# Patient Record
Sex: Female | Born: 1935
Health system: Southern US, Community
[De-identification: ages and names within clinical notes are randomized; demographics above are authoritative.]

## PROBLEM LIST (undated history)

## (undated) DIAGNOSIS — E785 Hyperlipidemia, unspecified: Secondary | ICD-10-CM

## (undated) DIAGNOSIS — Z8542 Personal history of malignant neoplasm of other parts of uterus: Secondary | ICD-10-CM

## (undated) DIAGNOSIS — I10 Essential (primary) hypertension: Secondary | ICD-10-CM

## (undated) DIAGNOSIS — C55 Malignant neoplasm of uterus, part unspecified: Secondary | ICD-10-CM

## (undated) DIAGNOSIS — K5730 Diverticulosis of large intestine without perforation or abscess without bleeding: Secondary | ICD-10-CM

## (undated) DIAGNOSIS — E119 Type 2 diabetes mellitus without complications: Secondary | ICD-10-CM

## (undated) DIAGNOSIS — N200 Calculus of kidney: Secondary | ICD-10-CM

## (undated) DIAGNOSIS — H409 Unspecified glaucoma: Secondary | ICD-10-CM

## (undated) DIAGNOSIS — K219 Gastro-esophageal reflux disease without esophagitis: Secondary | ICD-10-CM

## (undated) DIAGNOSIS — C259 Malignant neoplasm of pancreas, unspecified: Secondary | ICD-10-CM

## (undated) DIAGNOSIS — H269 Unspecified cataract: Secondary | ICD-10-CM

## (undated) DIAGNOSIS — Z800 Family history of malignant neoplasm of digestive organs: Secondary | ICD-10-CM

## (undated) DIAGNOSIS — Z8049 Family history of malignant neoplasm of other genital organs: Secondary | ICD-10-CM

## (undated) DIAGNOSIS — E079 Disorder of thyroid, unspecified: Secondary | ICD-10-CM

## (undated) HISTORY — DX: Personal history of malignant neoplasm of other parts of uterus: Z85.42

## (undated) HISTORY — PX: COLONOSCOPY: SHX174

## (undated) HISTORY — PX: BREAST EXCISIONAL BIOPSY: SUR124

## (undated) HISTORY — DX: Type 2 diabetes mellitus without complications: E11.9

## (undated) HISTORY — DX: Disorder of thyroid, unspecified: E07.9

## (undated) HISTORY — DX: Gastro-esophageal reflux disease without esophagitis: K21.9

## (undated) HISTORY — PX: UPPER GASTROINTESTINAL ENDOSCOPY: SHX188

## (undated) HISTORY — DX: Hyperlipidemia, unspecified: E78.5

## (undated) HISTORY — PX: OTHER SURGICAL HISTORY: SHX169

## (undated) HISTORY — DX: Diverticulosis of large intestine without perforation or abscess without bleeding: K57.30

## (undated) HISTORY — DX: Essential (primary) hypertension: I10

## (undated) HISTORY — DX: Unspecified cataract: H26.9

## (undated) HISTORY — PX: CHOLECYSTECTOMY: SHX55

## (undated) HISTORY — DX: Malignant neoplasm of uterus, part unspecified: C55

## (undated) HISTORY — DX: Family history of malignant neoplasm of digestive organs: Z80.0

## (undated) HISTORY — DX: Family history of malignant neoplasm of other genital organs: Z80.49

## (undated) HISTORY — DX: Unspecified glaucoma: H40.9

---

## 1984-05-17 ENCOUNTER — Encounter: Payer: Self-pay | Admitting: Gastroenterology

## 1994-01-02 HISTORY — PX: BREAST BIOPSY: SHX20

## 1996-01-03 DIAGNOSIS — C55 Malignant neoplasm of uterus, part unspecified: Secondary | ICD-10-CM

## 1996-01-03 HISTORY — DX: Malignant neoplasm of uterus, part unspecified: C55

## 1996-12-22 HISTORY — PX: ABDOMINAL HYSTERECTOMY: SHX81

## 1997-06-10 ENCOUNTER — Ambulatory Visit (HOSPITAL_BASED_OUTPATIENT_CLINIC_OR_DEPARTMENT_OTHER): Admission: RE | Admit: 1997-06-10 | Discharge: 1997-06-10 | Payer: Self-pay | Admitting: Plastic Surgery

## 1997-06-30 ENCOUNTER — Other Ambulatory Visit: Admission: RE | Admit: 1997-06-30 | Discharge: 1997-06-30 | Payer: Self-pay | Admitting: Gynecology

## 1997-10-14 ENCOUNTER — Other Ambulatory Visit: Admission: RE | Admit: 1997-10-14 | Discharge: 1997-10-14 | Payer: Self-pay | Admitting: Gynecology

## 1998-03-09 ENCOUNTER — Other Ambulatory Visit: Admission: RE | Admit: 1998-03-09 | Discharge: 1998-03-09 | Payer: Self-pay | Admitting: Gynecology

## 1998-10-06 ENCOUNTER — Other Ambulatory Visit: Admission: RE | Admit: 1998-10-06 | Discharge: 1998-10-06 | Payer: Self-pay | Admitting: Gynecology

## 1998-12-29 ENCOUNTER — Encounter: Payer: Self-pay | Admitting: Family Medicine

## 1998-12-29 LAB — CONVERTED CEMR LAB: TSH: 0.79 microintl units/mL

## 1999-01-17 ENCOUNTER — Encounter: Payer: Self-pay | Admitting: Family Medicine

## 1999-01-17 ENCOUNTER — Encounter: Admission: RE | Admit: 1999-01-17 | Discharge: 1999-01-17 | Payer: Self-pay | Admitting: Family Medicine

## 1999-04-20 ENCOUNTER — Other Ambulatory Visit: Admission: RE | Admit: 1999-04-20 | Discharge: 1999-04-20 | Payer: Self-pay | Admitting: Gynecology

## 1999-11-17 ENCOUNTER — Other Ambulatory Visit: Admission: RE | Admit: 1999-11-17 | Discharge: 1999-11-17 | Payer: Self-pay | Admitting: Gynecology

## 2000-02-07 ENCOUNTER — Encounter: Payer: Self-pay | Admitting: Gynecology

## 2000-02-07 ENCOUNTER — Encounter: Admission: RE | Admit: 2000-02-07 | Discharge: 2000-02-07 | Payer: Self-pay | Admitting: Gynecology

## 2000-04-19 ENCOUNTER — Other Ambulatory Visit: Admission: RE | Admit: 2000-04-19 | Discharge: 2000-04-19 | Payer: Self-pay | Admitting: Gynecology

## 2000-04-29 ENCOUNTER — Encounter: Payer: Self-pay | Admitting: Emergency Medicine

## 2000-04-29 ENCOUNTER — Emergency Department (HOSPITAL_COMMUNITY): Admission: EM | Admit: 2000-04-29 | Discharge: 2000-04-29 | Payer: Self-pay | Admitting: Emergency Medicine

## 2000-04-29 ENCOUNTER — Encounter: Payer: Self-pay | Admitting: Family Medicine

## 2000-04-30 ENCOUNTER — Encounter: Payer: Self-pay | Admitting: Emergency Medicine

## 2000-04-30 ENCOUNTER — Inpatient Hospital Stay (HOSPITAL_COMMUNITY): Admission: EM | Admit: 2000-04-30 | Discharge: 2000-05-01 | Payer: Self-pay | Admitting: Emergency Medicine

## 2000-05-01 ENCOUNTER — Encounter: Payer: Self-pay | Admitting: Family Medicine

## 2000-05-01 LAB — CONVERTED CEMR LAB: WBC, blood: 10.7 10*3/uL

## 2000-09-27 ENCOUNTER — Encounter: Payer: Self-pay | Admitting: Family Medicine

## 2000-09-27 LAB — CONVERTED CEMR LAB
Blood Glucose, Fasting: 98 mg/dL
TSH: 0.57 microintl units/mL

## 2000-10-08 ENCOUNTER — Encounter: Payer: Self-pay | Admitting: Family Medicine

## 2000-10-08 ENCOUNTER — Encounter: Admission: RE | Admit: 2000-10-08 | Discharge: 2000-10-08 | Payer: Self-pay | Admitting: Family Medicine

## 2000-12-31 ENCOUNTER — Encounter: Payer: Self-pay | Admitting: Family Medicine

## 2001-04-29 ENCOUNTER — Other Ambulatory Visit: Admission: RE | Admit: 2001-04-29 | Discharge: 2001-04-29 | Payer: Self-pay | Admitting: Gynecology

## 2001-05-09 ENCOUNTER — Encounter: Payer: Self-pay | Admitting: Family Medicine

## 2001-05-09 ENCOUNTER — Encounter: Admission: RE | Admit: 2001-05-09 | Discharge: 2001-05-09 | Payer: Self-pay | Admitting: Family Medicine

## 2001-09-17 ENCOUNTER — Encounter: Payer: Self-pay | Admitting: Emergency Medicine

## 2001-09-17 ENCOUNTER — Emergency Department (HOSPITAL_COMMUNITY): Admission: EM | Admit: 2001-09-17 | Discharge: 2001-09-17 | Payer: Self-pay | Admitting: Emergency Medicine

## 2001-10-02 ENCOUNTER — Encounter: Payer: Self-pay | Admitting: Gastroenterology

## 2001-10-10 ENCOUNTER — Encounter: Payer: Self-pay | Admitting: Gastroenterology

## 2001-10-10 ENCOUNTER — Ambulatory Visit (HOSPITAL_COMMUNITY): Admission: RE | Admit: 2001-10-10 | Discharge: 2001-10-10 | Payer: Self-pay | Admitting: Gastroenterology

## 2001-11-04 ENCOUNTER — Encounter: Payer: Self-pay | Admitting: Gastroenterology

## 2001-11-04 LAB — HM COLONOSCOPY

## 2002-01-10 ENCOUNTER — Encounter: Payer: Self-pay | Admitting: Family Medicine

## 2002-01-10 LAB — CONVERTED CEMR LAB
Blood Glucose, Fasting: 102 mg/dL
RBC count: 4.62 10*6/uL
WBC, blood: 6.3 10*3/uL

## 2002-06-19 ENCOUNTER — Encounter: Admission: RE | Admit: 2002-06-19 | Discharge: 2002-06-19 | Payer: Self-pay | Admitting: Family Medicine

## 2002-06-19 ENCOUNTER — Encounter: Payer: Self-pay | Admitting: Family Medicine

## 2002-06-26 ENCOUNTER — Ambulatory Visit (HOSPITAL_BASED_OUTPATIENT_CLINIC_OR_DEPARTMENT_OTHER): Admission: RE | Admit: 2002-06-26 | Discharge: 2002-06-26 | Payer: Self-pay | Admitting: Plastic Surgery

## 2002-06-26 ENCOUNTER — Encounter (INDEPENDENT_AMBULATORY_CARE_PROVIDER_SITE_OTHER): Payer: Self-pay | Admitting: *Deleted

## 2002-07-10 ENCOUNTER — Other Ambulatory Visit: Admission: RE | Admit: 2002-07-10 | Discharge: 2002-07-10 | Payer: Self-pay | Admitting: Gynecology

## 2003-01-15 ENCOUNTER — Encounter: Payer: Self-pay | Admitting: Family Medicine

## 2003-01-15 LAB — CONVERTED CEMR LAB: TSH: 0.42 microintl units/mL

## 2003-08-17 ENCOUNTER — Other Ambulatory Visit: Admission: RE | Admit: 2003-08-17 | Discharge: 2003-08-17 | Payer: Self-pay | Admitting: Gynecology

## 2003-12-04 ENCOUNTER — Ambulatory Visit: Payer: Self-pay | Admitting: Family Medicine

## 2004-03-10 ENCOUNTER — Ambulatory Visit: Payer: Self-pay | Admitting: Family Medicine

## 2004-03-11 ENCOUNTER — Encounter: Payer: Self-pay | Admitting: Family Medicine

## 2004-03-11 LAB — CONVERTED CEMR LAB
Blood Glucose, Fasting: 113 mg/dL
RBC count: 4.73 10*6/uL
TSH: 1.13 microintl units/mL
WBC, blood: 7 10*3/uL

## 2004-03-14 ENCOUNTER — Ambulatory Visit: Payer: Self-pay | Admitting: Family Medicine

## 2004-03-31 ENCOUNTER — Encounter: Admission: RE | Admit: 2004-03-31 | Discharge: 2004-03-31 | Payer: Self-pay | Admitting: Family Medicine

## 2004-03-31 ENCOUNTER — Ambulatory Visit: Payer: Self-pay | Admitting: Family Medicine

## 2004-07-04 ENCOUNTER — Ambulatory Visit: Payer: Self-pay | Admitting: Family Medicine

## 2004-07-04 LAB — CONVERTED CEMR LAB
RBC count: 4.89 10*6/uL
WBC, blood: 3.1 10*3/uL

## 2004-07-20 ENCOUNTER — Encounter: Payer: Self-pay | Admitting: Family Medicine

## 2004-07-22 ENCOUNTER — Ambulatory Visit (HOSPITAL_COMMUNITY): Admission: RE | Admit: 2004-07-22 | Discharge: 2004-07-22 | Payer: Self-pay | Admitting: Ophthalmology

## 2004-08-30 ENCOUNTER — Other Ambulatory Visit: Admission: RE | Admit: 2004-08-30 | Discharge: 2004-08-30 | Payer: Self-pay | Admitting: Gynecology

## 2004-09-14 ENCOUNTER — Ambulatory Visit: Payer: Self-pay | Admitting: Family Medicine

## 2004-09-16 ENCOUNTER — Ambulatory Visit: Payer: Self-pay | Admitting: Family Medicine

## 2004-09-20 ENCOUNTER — Encounter: Admission: RE | Admit: 2004-09-20 | Discharge: 2004-09-20 | Payer: Self-pay | Admitting: Family Medicine

## 2005-03-09 ENCOUNTER — Ambulatory Visit: Payer: Self-pay | Admitting: Family Medicine

## 2005-03-09 LAB — CONVERTED CEMR LAB
Blood Glucose, Fasting: 83 mg/dL
TSH: 1.13 microintl units/mL
WBC, blood: 6.1 10*3/uL

## 2005-03-14 ENCOUNTER — Ambulatory Visit: Payer: Self-pay | Admitting: Family Medicine

## 2005-03-14 LAB — CONVERTED CEMR LAB: WBC, blood: 5.2 10*3/uL

## 2005-03-29 ENCOUNTER — Ambulatory Visit: Payer: Self-pay | Admitting: Family Medicine

## 2005-04-04 ENCOUNTER — Encounter: Admission: RE | Admit: 2005-04-04 | Discharge: 2005-04-04 | Payer: Self-pay | Admitting: Family Medicine

## 2005-06-13 ENCOUNTER — Ambulatory Visit: Payer: Self-pay | Admitting: Family Medicine

## 2005-06-13 LAB — CONVERTED CEMR LAB
Blood Glucose, Fasting: 91 mg/dL
TSH: 0.18 microintl units/mL

## 2005-06-15 ENCOUNTER — Ambulatory Visit: Payer: Self-pay | Admitting: Family Medicine

## 2005-10-17 ENCOUNTER — Ambulatory Visit: Payer: Self-pay | Admitting: Family Medicine

## 2005-10-19 ENCOUNTER — Ambulatory Visit: Payer: Self-pay | Admitting: Family Medicine

## 2006-06-21 ENCOUNTER — Ambulatory Visit: Payer: Self-pay | Admitting: Family Medicine

## 2006-11-07 ENCOUNTER — Telehealth: Payer: Self-pay | Admitting: Family Medicine

## 2006-11-19 ENCOUNTER — Encounter: Admission: RE | Admit: 2006-11-19 | Discharge: 2006-11-19 | Payer: Self-pay | Admitting: Family Medicine

## 2006-11-20 ENCOUNTER — Ambulatory Visit: Payer: Self-pay | Admitting: Family Medicine

## 2006-11-28 ENCOUNTER — Encounter (INDEPENDENT_AMBULATORY_CARE_PROVIDER_SITE_OTHER): Payer: Self-pay | Admitting: *Deleted

## 2007-01-30 ENCOUNTER — Ambulatory Visit: Payer: Self-pay | Admitting: Family Medicine

## 2007-05-28 ENCOUNTER — Ambulatory Visit: Payer: Self-pay | Admitting: Family Medicine

## 2007-05-28 ENCOUNTER — Encounter: Payer: Self-pay | Admitting: Family Medicine

## 2007-05-28 DIAGNOSIS — M899 Disorder of bone, unspecified: Secondary | ICD-10-CM

## 2007-05-28 DIAGNOSIS — M949 Disorder of cartilage, unspecified: Secondary | ICD-10-CM

## 2007-05-28 DIAGNOSIS — E039 Hypothyroidism, unspecified: Secondary | ICD-10-CM | POA: Insufficient documentation

## 2007-05-28 DIAGNOSIS — C55 Malignant neoplasm of uterus, part unspecified: Secondary | ICD-10-CM

## 2007-05-28 DIAGNOSIS — R7301 Impaired fasting glucose: Secondary | ICD-10-CM | POA: Insufficient documentation

## 2007-05-28 DIAGNOSIS — E782 Mixed hyperlipidemia: Secondary | ICD-10-CM

## 2007-05-28 DIAGNOSIS — K5730 Diverticulosis of large intestine without perforation or abscess without bleeding: Secondary | ICD-10-CM | POA: Insufficient documentation

## 2007-05-28 DIAGNOSIS — I10 Essential (primary) hypertension: Secondary | ICD-10-CM

## 2007-05-28 LAB — CONVERTED CEMR LAB
ALT: 17 units/L (ref 0–35)
Albumin: 3.6 g/dL (ref 3.5–5.2)
BUN: 13 mg/dL (ref 6–23)
Chloride: 109 meq/L (ref 96–112)
Cholesterol: 248 mg/dL (ref 0–200)
Eosinophils Absolute: 0.1 10*3/uL (ref 0.0–0.7)
GFR calc Af Amer: 90 mL/min
GFR calc non Af Amer: 74 mL/min
Glucose, Bld: 109 mg/dL — ABNORMAL HIGH (ref 70–99)
HDL: 56.1 mg/dL (ref >39.0)
Hemoglobin: 14.3 g/dL (ref 12.0–15.0)
MCHC: 33.8 g/dL (ref 30.0–36.0)
Neutrophils Relative %: 36.1 % — ABNORMAL LOW (ref 43.0–77.0)
Platelets: 249 10*3/uL (ref 150–400)
RBC: 4.55 M/uL (ref 3.87–5.11)
RDW: 12.2 % (ref 11.5–14.6)
Sodium: 143 meq/L (ref 135–145)
TSH: 0.59 microintl units/mL (ref 0.35–5.50)
Total Protein: 6.8 g/dL (ref 6.0–8.3)
Triglycerides: 229 mg/dL (ref 0–149)
VLDL: 46 mg/dL — ABNORMAL HIGH (ref 0–40)

## 2007-06-11 ENCOUNTER — Ambulatory Visit: Payer: Self-pay | Admitting: Family Medicine

## 2007-07-29 ENCOUNTER — Encounter: Payer: Self-pay | Admitting: Family Medicine

## 2007-08-01 ENCOUNTER — Encounter (INDEPENDENT_AMBULATORY_CARE_PROVIDER_SITE_OTHER): Payer: Self-pay | Admitting: *Deleted

## 2007-08-01 ENCOUNTER — Ambulatory Visit: Payer: Self-pay | Admitting: Family Medicine

## 2007-08-01 LAB — CONVERTED CEMR LAB: OCCULT 1: NEGATIVE

## 2007-08-22 ENCOUNTER — Ambulatory Visit: Payer: Self-pay | Admitting: Family Medicine

## 2007-09-10 ENCOUNTER — Encounter: Payer: Self-pay | Admitting: Family Medicine

## 2007-09-19 ENCOUNTER — Ambulatory Visit: Payer: Self-pay | Admitting: Family Medicine

## 2007-09-19 LAB — CONVERTED CEMR LAB
ALT: 19 units/L (ref 0–35)
AST: 21 units/L (ref 0–37)

## 2007-11-12 ENCOUNTER — Ambulatory Visit: Payer: Self-pay | Admitting: Family Medicine

## 2007-11-12 LAB — CONVERTED CEMR LAB
ALT: 19 units/L (ref 0–35)
Cholesterol: 165 mg/dL (ref 0–200)
HDL: 59.4 mg/dL (ref >39.0)
LDL Cholesterol: 84 mg/dL (ref 0–99)
VLDL: 22 mg/dL (ref 0–40)

## 2007-11-19 ENCOUNTER — Ambulatory Visit: Payer: Self-pay | Admitting: Family Medicine

## 2007-11-19 DIAGNOSIS — I739 Peripheral vascular disease, unspecified: Secondary | ICD-10-CM | POA: Insufficient documentation

## 2007-11-25 ENCOUNTER — Telehealth: Payer: Self-pay | Admitting: Family Medicine

## 2008-01-01 ENCOUNTER — Encounter: Payer: Self-pay | Admitting: Family Medicine

## 2008-01-22 ENCOUNTER — Encounter: Admission: RE | Admit: 2008-01-22 | Discharge: 2008-01-22 | Payer: Self-pay | Admitting: Orthopedic Surgery

## 2008-01-23 ENCOUNTER — Ambulatory Visit (HOSPITAL_BASED_OUTPATIENT_CLINIC_OR_DEPARTMENT_OTHER): Admission: RE | Admit: 2008-01-23 | Discharge: 2008-01-23 | Payer: Self-pay | Admitting: Orthopedic Surgery

## 2008-01-23 ENCOUNTER — Encounter (INDEPENDENT_AMBULATORY_CARE_PROVIDER_SITE_OTHER): Payer: Self-pay | Admitting: Orthopedic Surgery

## 2008-01-23 ENCOUNTER — Encounter: Payer: Self-pay | Admitting: Family Medicine

## 2008-01-23 HISTORY — PX: OTHER SURGICAL HISTORY: SHX169

## 2008-02-24 ENCOUNTER — Encounter: Payer: Self-pay | Admitting: Family Medicine

## 2008-04-01 ENCOUNTER — Encounter: Payer: Self-pay | Admitting: Family Medicine

## 2008-06-02 ENCOUNTER — Ambulatory Visit: Payer: Self-pay | Admitting: Family Medicine

## 2008-06-02 DIAGNOSIS — M2550 Pain in unspecified joint: Secondary | ICD-10-CM

## 2008-06-10 ENCOUNTER — Encounter: Payer: Self-pay | Admitting: Family Medicine

## 2008-06-10 ENCOUNTER — Encounter: Admission: RE | Admit: 2008-06-10 | Discharge: 2008-06-10 | Payer: Self-pay | Admitting: Rheumatology

## 2008-06-18 ENCOUNTER — Ambulatory Visit: Payer: Self-pay | Admitting: Family Medicine

## 2008-06-18 LAB — CONVERTED CEMR LAB
Basophils Absolute: 0 10*3/uL (ref 0.0–0.1)
Basophils Relative: 0.5 % (ref 0.0–3.0)
CO2: 31 meq/L (ref 19–32)
Cholesterol: 196 mg/dL (ref 0–200)
Eosinophils Absolute: 0.1 10*3/uL (ref 0.0–0.7)
Free T4: 0.9 ng/dL (ref 0.6–1.6)
GFR calc non Af Amer: 73.99 mL/min (ref >60)
HDL: 74.9 mg/dL (ref >39.00)
Lymphocytes Relative: 51 % — ABNORMAL HIGH (ref 12.0–46.0)
Microalb Creat Ratio: 5.9 mg/g (ref 0.0–30.0)
Microalb, Ur: 0.9 mg/dL (ref 0.0–1.9)
Neutro Abs: 2.7 10*3/uL (ref 1.4–7.7)
Neutrophils Relative %: 39.8 % — ABNORMAL LOW (ref 43.0–77.0)
Platelets: 251 10*3/uL (ref 150.0–400.0)
Sodium: 142 meq/L (ref 135–145)
TSH: 0.55 microintl units/mL (ref 0.35–5.50)
Total Bilirubin: 0.9 mg/dL (ref 0.3–1.2)
Total CHOL/HDL Ratio: 3
Total Protein: 7.2 g/dL (ref 6.0–8.3)
VLDL: 25.4 mg/dL (ref 0.0–40.0)

## 2008-06-20 LAB — CONVERTED CEMR LAB: Vit D, 25-Hydroxy: 21 ng/mL — ABNORMAL LOW (ref 30–89)

## 2008-06-23 ENCOUNTER — Ambulatory Visit: Payer: Self-pay | Admitting: Family Medicine

## 2008-06-23 DIAGNOSIS — R1032 Left lower quadrant pain: Secondary | ICD-10-CM | POA: Insufficient documentation

## 2008-06-23 LAB — CONVERTED CEMR LAB: Bilirubin Urine: NEGATIVE

## 2008-07-21 ENCOUNTER — Encounter: Payer: Self-pay | Admitting: Family Medicine

## 2008-07-30 ENCOUNTER — Ambulatory Visit: Payer: Self-pay | Admitting: Family Medicine

## 2008-07-30 DIAGNOSIS — M76829 Posterior tibial tendinitis, unspecified leg: Secondary | ICD-10-CM

## 2008-09-14 ENCOUNTER — Ambulatory Visit: Payer: Self-pay | Admitting: Family Medicine

## 2008-09-14 DIAGNOSIS — K219 Gastro-esophageal reflux disease without esophagitis: Secondary | ICD-10-CM

## 2008-09-14 DIAGNOSIS — R1013 Epigastric pain: Secondary | ICD-10-CM | POA: Insufficient documentation

## 2008-09-14 LAB — CONVERTED CEMR LAB
ALT: 20 units/L (ref 0–35)
AST: 26 units/L (ref 0–37)
Albumin: 3.7 g/dL (ref 3.5–5.2)
Alkaline Phosphatase: 56 units/L (ref 39–117)
H Pylori IgG: NEGATIVE
Total Protein: 7.3 g/dL (ref 6.0–8.3)

## 2008-09-17 ENCOUNTER — Encounter (INDEPENDENT_AMBULATORY_CARE_PROVIDER_SITE_OTHER): Payer: Self-pay | Admitting: *Deleted

## 2008-10-26 ENCOUNTER — Ambulatory Visit: Payer: Self-pay | Admitting: Family Medicine

## 2008-10-26 DIAGNOSIS — A09 Infectious gastroenteritis and colitis, unspecified: Secondary | ICD-10-CM | POA: Insufficient documentation

## 2009-01-25 ENCOUNTER — Telehealth: Payer: Self-pay | Admitting: Family Medicine

## 2009-01-28 ENCOUNTER — Encounter (INDEPENDENT_AMBULATORY_CARE_PROVIDER_SITE_OTHER): Payer: Self-pay | Admitting: *Deleted

## 2009-01-28 ENCOUNTER — Ambulatory Visit: Payer: Self-pay | Admitting: Family Medicine

## 2009-02-23 ENCOUNTER — Encounter (INDEPENDENT_AMBULATORY_CARE_PROVIDER_SITE_OTHER): Payer: Self-pay | Admitting: *Deleted

## 2009-02-23 ENCOUNTER — Ambulatory Visit: Payer: Self-pay | Admitting: Gastroenterology

## 2009-02-24 LAB — CONVERTED CEMR LAB
ALT: 18 units/L (ref 0–35)
Albumin: 3.8 g/dL (ref 3.5–5.2)
BUN: 12 mg/dL (ref 6–23)
Basophils Relative: 0.5 % (ref 0.0–3.0)
Bilirubin, Direct: 0.1 mg/dL (ref 0.0–0.3)
CO2: 34 meq/L — ABNORMAL HIGH (ref 19–32)
Creatinine, Ser: 0.8 mg/dL (ref 0.4–1.2)
Glucose, Bld: 99 mg/dL (ref 70–99)
Lipase: 25 units/L (ref 11.0–59.0)
Lymphocytes Relative: 47.4 % — ABNORMAL HIGH (ref 12.0–46.0)
Lymphs Abs: 2.8 10*3/uL (ref 0.7–4.0)
MCHC: 33 g/dL (ref 30.0–36.0)
Monocytes Absolute: 0.5 10*3/uL (ref 0.1–1.0)
Neutro Abs: 2.5 10*3/uL (ref 1.4–7.7)
Platelets: 247 10*3/uL (ref 150.0–400.0)
RBC: 4.35 M/uL (ref 3.87–5.11)
RDW: 12.5 % (ref 11.5–14.6)
Sodium: 144 meq/L (ref 135–145)
Total Bilirubin: 0.4 mg/dL (ref 0.3–1.2)
WBC: 5.9 10*3/uL (ref 4.5–10.5)

## 2009-03-04 ENCOUNTER — Ambulatory Visit: Payer: Self-pay | Admitting: Gastroenterology

## 2009-03-05 ENCOUNTER — Encounter: Payer: Self-pay | Admitting: Gastroenterology

## 2009-03-08 ENCOUNTER — Telehealth: Payer: Self-pay | Admitting: Gastroenterology

## 2009-07-28 ENCOUNTER — Encounter: Payer: Self-pay | Admitting: Family Medicine

## 2009-08-10 ENCOUNTER — Encounter (INDEPENDENT_AMBULATORY_CARE_PROVIDER_SITE_OTHER): Payer: Self-pay | Admitting: *Deleted

## 2010-02-01 NOTE — Letter (Signed)
Summary: New Patient letter  [facility]  [address] [place], [place] [address]   Phone: [phone]  Fax: [phone]       01/28/2009 MRN: [id number]  [facility] [address] [place], [place]  [address]  Dear Ms. Ogle,  Welcome to the Gastroenterology Division at [facility].    You are scheduled to see Dr. Russella Dar on 02-23-09 at 1:45p.m. on the 3rd floor at [facility], [address]  We ask that you try to arrive at our office 15 minutes prior to your appointment time to allow for check-in.  We would like you to complete the enclosed self-administered evaluation form prior to your visit and bring it with you on the day of your appointment.  We will review it with you.  Also, please bring a complete list of all your medications or, if you prefer, bring the medication bottles and we will list them.  Please bring your insurance card so that we may make a copy of it.  If your insurance requires a referral to see a specialist, please bring your referral form from your primary care physician.  Co-payments are due at the time of your visit and may be paid by cash, check or credit card.     Your office visit will consist of a consult with your physician (includes a physical exam), any laboratory testing he/she may order, scheduling of any necessary diagnostic testing (e.g. x-ray, ultrasound, CT-scan), and scheduling of a procedure (e.g. Endoscopy, Colonoscopy) if required.  Please allow enough time on your schedule to allow for any/all of these possibilities.    If you cannot keep your appointment, please call [phone] to cancel or reschedule prior to your appointment date.  This allows [place] the opportunity to schedule an appointment for another patient in need of care.  If you do not cancel or reschedule by 5 p.m. the business day prior to your appointment date, you will be charged a $50.00 late cancellation/no-show fee.    Thank you for choosing [facility] for your medical needs.  We appreciate the opportunity to care for you.  Please visit [place] at our website  to learn more about our practice.                     Sincerely,                                                             The Gastroenterology Division

## 2010-02-01 NOTE — Letter (Signed)
Summary: EGD Instructions  [facility]  [address] [place], [place] [address]   Phone: [phone]  Fax: [phone]       Molly [name]    [birth year]    MRN: [id number]       Procedure Day /Date: Thursday March 3rd, 2011     Arrival Time:  7:30am     Procedure Time: 8:30am     Location of Procedure:                    _ x _ [facility] (4th Floor)    PREPARATION FOR ENDOSCOPY   On  03/04/09 THE DAY OF THE PROCEDURE:  1.   No solid foods, milk or milk products are allowed after midnight the night before your procedure.  2.   Do not drink anything colored red or purple.  Avoid juices with pulp.  No orange juice.  3.  You may drink clear liquids until 6:30am  , which is 2 hours before your procedure.                                                                                                CLEAR LIQUIDS INCLUDE: Water Jello Ice Popsicles Tea (sugar ok, no milk/cream) Powdered fruit flavored drinks Coffee (sugar ok, no milk/cream) Gatorade Juice: apple, white grape, white cranberry  Lemonade Clear bullion, consomm, broth Carbonated beverages (any kind) Strained chicken noodle soup Hard Candy   MEDICATION INSTRUCTIONS  Unless otherwise instructed, you should take regular prescription medications with a small sip of water as early as possible the morning of your procedure.           OTHER INSTRUCTIONS  You will need a responsible adult at least 75 years of age to accompany you and drive you home.   This person must remain in the waiting room during your procedure.  Wear loose fitting clothing that is easily removed.  Leave jewelry and other valuables at home.  However, you may wish to bring a book to read or an iPod/MP3 player to listen to music as you wait for your procedure to start.  Remove all body piercing jewelry and leave at home.  Total time from sign-in until discharge is approximately 2-3 hours.  You should go  home directly after your procedure and rest.  You can resume normal activities the day after your procedure.  The day of your procedure you should not:   Drive   Make legal decisions   Operate machinery   Drink alcohol   Return to work  You will receive specific instructions about eating, activities and medications before you leave.    The above instructions have been reviewed and explained to me by   Marchelle Folks.     I fully understand and can verbalize these instructions _____________________________ Date _________

## 2010-02-01 NOTE — Letter (Signed)
Summary: Nadara Eaton letter  [place] [facility]  [address] [place], [place] [address]   Phone: [phone]  Fax: [phone]       08/10/2009 MRN: [id number]  [facility] [address] [place], [place]  [address]  Dear Ms. [facility] [place] and [place] announce the retirement of Arta Silence, M.D., from full-time practice at the [facility] office effective July 01, 2009 and his plans of returning part-time.  It is important to Dr. Hetty Ely and to our practice that you understand that [facility] has seven physicians in our office for your health care needs.  We will continue to offer the same exceptional care that you have today.    Dr. Hetty Ely has spoken to many of you about his plans for retirement and returning part-time in the fall.   We will continue to work with you through the transition to schedule appointments for you in the office and meet the high standards that Dante is committed to.   Again, it is with great pleasure that we share the news that Dr. Hetty Ely will return to [facility] in October of 2011 with a reduced schedule.    If you have any questions, or would like to request an appointment with one of our physicians, please call us at [phone] and press the option for Scheduling an appointment.  We take pleasure in providing you with excellent patient care and look forward to seeing you at your next office visit.  Our [facility] Physicians are:  Tillman Abide, M.D. Laurita Quint, M.D. Roxy Manns, M.D. Kerby Nora, M.D. Hannah Beat, M.D. Ruthe Mannan, M.D. We proudly welcomed Raechel Ache, M.D. and Eustaquio Boyden, M.D. to the practice in July/August 2011.  Sincerely,  [facility]

## 2010-02-01 NOTE — Letter (Signed)
Summary: [name] Biopsy Results  [facility]  [address] [place], [place] [address]   Phone: [phone]  Fax: [phone]        March 05, 2009 MRN: [id number]    [facility] [address] [place], [place]  [address]    Dear Ms. Watkin,  I am pleased to inform you that the biopsies taken during your recent endoscopic examination did not show any evidence of cancer upon pathologic examination. The biopsies were unremarkable.  Continue with the treatment plan as outlined on the day of your      exam.  Please call us if you are having persistent problems or have questions about your condition that have not been fully answered at this time.  Sincerely,  Meryl Dare MD [facility]  This letter has been electronically signed by your physician.  Appended Document: Patient Notice-Endo Biopsy Results Letter mailed 3.8.11

## 2010-02-01 NOTE — Medication Information (Signed)
Summary: Premarin Review/[facility]  Premarin Review/[facility]   Imported By: Maryln Gottron 08/02/2009 14:40:56  _____________________________________________________________________  External Attachment:    Type:   Image     Comment:   External Document

## 2010-02-01 NOTE — Procedures (Signed)
Summary: [facility]  [facility]   Imported By: Sherian Rein 02/24/2009 09:01:34  _____________________________________________________________________  External Attachment:    Type:   Image     Comment:   External Document

## 2010-02-01 NOTE — Miscellaneous (Signed)
Summary: dexilant rx.  Clinical Lists Changes  Medications: Added new medication of DEXILANT 60 MG CPDR (DEXLANSOPRAZOLE) take 1 tab every morning. - Signed Rx of DEXILANT 60 MG CPDR (DEXLANSOPRAZOLE) take 1 tab every morning.;  #30 x 5;  Signed;  Entered by: Darlyn Read RN;  Authorized by: Meryl Dare MD Clementeen Graham;  Method used: Electronically to [facility] [place], [place]  [address], Ph: [phone], Fax: [phone]    Prescriptions: DEXILANT 60 MG CPDR (DEXLANSOPRAZOLE) take 1 tab every morning.  #30 x 5   Entered by:   Darlyn Read RN   Authorized by:   Meryl Dare MD [facility]   Signed by:   Darlyn Read RN on 03/04/2009   Method used:   Electronically to        [address] (retail)       [address]       [place], [place]  [address]       Ph: [phone]       Fax: [phone]   RxID:   [id number]

## 2010-02-01 NOTE — Assessment & Plan Note (Signed)
Summary: Gastroenterology  Molly [name]"  MR#:  [id number] Page #  Molly [name]   GASTROENTEROLOGY CONSULTATION OFFICE NOTE  NAME:  Molly [name], Molly "Peggy"   OFFICE NO:  [id number]  DATE:  10/02/01  DOB:  [birth year]  REFERRING PHYSICIAN:  Dr. Laurita Quint.  REASON FOR REFERRAL:  Abnormal liver function tests and abnormal imaging of the common bile duct.    HISTORY OF PRESENT ILLNESS:  The patient is a very nice 77 year old white female referred through the courtesy of Dr. Hetty Ely.  Her son, Letta Moynahan, is a patient of mine as well.  The patient presented to the emergency room at [facility] on September 17, 2001 with a several-day history of diarrhea, nausea, and abdominal pain.  She felt she was dehydrated.  Evaluation revealed a minimally elevated glucose at 123, an SGOT of 54, and a SGPT of 45.  She had 11-20 leukocytes per high-power field on her urinalysis.  Abdominal ultrasound imaging revealed an 11 mm common bile duct, gallbladder surgically absent.  CT scan of the abdomen revealed mild dilation of the common bile duct, an absent gallbladder, and no other abnormalities.  Her symptoms completely resolved after several days, and she was presumed to have an infectious gastroenteritis.  She previously had an ERCP by Dr. Sheryn Bison in 1986.  Following her cholecystectomy, the ERCP was normal.  She underwent colonoscopy by Dr. Jarold Motto in 1986, which was normal.  Upper endoscopy by Dr. Corinda [name] in 1986 revealed a mild gastritis.  She underwent flexible sigmoidoscopy in 1992, which revealed external hemorrhoids and no internal lesions.  She has not had colorectal cancer screening with sigmoidoscopy or colonoscopy since that time.  She notes no melena, dark urine, jaundice, hematochezia, change in stool caliber, recurrent abdominal pain, nausea, vomiting, or reflux symptoms.  PAST MEDICAL HISTORY:  Hypothyroidism since the patient's 30s, external hemorrhoids,  hypercholesterolemia, hypertension, uterine cancer, status post total abdominal hysterectomy, history of cystitis, status post cholecystectomy, status post left breast biopsy, benign, in 1996.  CURRENT MEDICATIONS:  As listed on the chart, updated and reviewed.    ALLERGIES:  Novocain, Macrodantin, and morphine.    PHYSICAL EXAMINATION:  A well-developed, well-nourished, white female in no acute distress.  Vital signs:  Weight not obtained.  Blood pressure is 110/68, pulse is 64 and regular.  HEENT exam:  Anicteric sclerae.  Oropharynx clear.  Chest:  Clear to auscultation and percussion.  Cardiac:  A regular rate and rhythm without murmurs.  Abdomen is soft, nontender, nondistended, normal, active bowel sounds, no palpable organomegaly, masses, or hernias.  Rectal examination deferred.  Extremities without clubbing, cyanosis, or edema.  Neurologic:  Alert and oriented x 3.    ASSESSMENT AND PLAN:   1.  Mild common bile duct dilation and mildly abnormal liver function tests.  Mild common bile duct dilation is common post cholecystectomy, however, in light of the abnormal liver function tests, obstructing lesions, such as a stone, should be excluded.  Magnetic resonance cholangiopancreatography will be scheduled for further evaluation.  If abnormalities are noted, endoscopic retrograde choledochopancreatography may be indicated.  I plan to repeat the patient's liver function tests today, and if they remain abnormal without evidence of biliary pathology, further hepatic evaluation will be necessary.  It does appear that she had an infectious gastroenteritis leading to her symptoms on her recent emergency room evaluation. 2.  Average risk for colorectal cancer.  The risks, benefits, and alternatives to colonoscopy with possible polypectomy discussed with the patient, and she  consents to proceed.  This will be scheduled electively. 3.  Abnormal urinalysis and ongoing medical care.  Further follow up with Dr.  Hetty Ely.     Venita Lick. Russella Dar, M.D., F.A.C.G.  [id number] cc:  Dr. Laurita Quint D:  10/02/01; T:  ; Job [id number]

## 2010-02-01 NOTE — Procedures (Signed)
Summary: Upper Endoscopy  Patient: Molly [name] Note: All result statuses are Final unless otherwise noted.  Tests: (1) Upper Endoscopy (EGD)   EGD Upper Endoscopy       DONE     [facility]     [address]     [place], [place]  [address]           ENDOSCOPY PROCEDURE REPORT           PATIENT:  Molly [name], Molly [name]  MR#:  [id number]     BIRTHDATE:  1929/12/07, 79 yrs. old  GENDER:  female           ENDOSCOPIST:  Judie Petit T. Russella Dar, MD, [facility]           PROCEDURE DATE:  03/04/2009     PROCEDURE:  EGD with biopsy     ASA CLASS:  Class II     INDICATIONS:  epigastric pain           MEDICATIONS:  Fentanyl 25 mcg IV, Versed 5 mg IV     TOPICAL ANESTHETIC:  Exactacain Spray           DESCRIPTION OF PROCEDURE:   After the risks benefits and     alternatives of the procedure were thoroughly explained, informed     consent was obtained.  The LB GIF-H180 [id number] endoscope was     introduced through the mouth and advanced to the second portion of     the duodenum, without limitations.  The instrument was slowly     withdrawn as the mucosa was fully examined.     <<PROCEDUREIMAGES>>           Esophagitis was found in the distal esophagus. It was erosive. LA     Classifcation Grade A.  Moderate gastritis was found in the body     of the stomach. It was erosive and erythematous. Multiple biopsies     were obtained and sent to pathology.  The duodenal bulb was normal     in appearance, as was the postbulbar duodenum.  Otherwise the     examination was normal.  Retroflexed views revealed no     abnormalities.  The scope was then withdrawn from the patient and     the procedure completed.           COMPLICATIONS:  None           ENDOSCOPIC IMPRESSION:     1) Erosive esophagitis     2) Moderate gastritis           RECOMMENDATIONS:     1) Anti-reflux regimen     2) PPI qam: Dexilant 60mg  po qam, #30, 5 refills     3) Await pathology results     4) OP follow-up in 4 weeks.       Venita Lick. Russella Dar, MD, Clementeen Graham           CC:  Arta Silence, MD           n.     Rosalie DoctorVenita Lick. Hank Walling at 03/04/2009 08:41 AM           [place], [id number]  Note: An exclamation mark (!) indicates a result that was not dispersed into the flowsheet. Document Creation Date: 03/04/2009 8:42 AM _______________________________________________________________________  (1) Order result status: Final Collection or observation date-time: 03/04/2009 08:36 Requested date-time:  Receipt date-time:  Reported date-time:  Referring Physician:   Ordering Physician: Claudette Head [phone]) Specimen  Source:  Source: Launa Grill Order Number: [id number] Lab site:   Appended Document: Upper Endoscopy    Clinical Lists Changes  Observations: Added new observation of PAST SURG HX: DETACHED RETINA REPAIR OS CHOLEYCYSTECTOMY 1985 GYN-- VAGINAL ULTRASOUND:(09/1996) T.A.H. --B.S.O. -- UTERINE CANCER:9DR FONTAINE)(12/22/1996) DEXA SCAN-- --MIN OSTEOPENIA:( 01/1999) HOSPITAL [facility]- PNEUMONIA--HYPOKALEMIA--NAUSEA AND VOMITING:(04/28-30/2002) COLONOSCOPY --DIVERICS, INTERNAL HEMORRHOIDS:(12/01/2001) ABD. CT-- NORMAL:(10/10/2001) DEXA -- IMPROVED// NOT OSTEOPENIA:(09/20/2004) Release of R Long finger A1 Pulley, Excision Myxoid Dorsal mass R Index Finger (Dr Teressa Senter) (01/23/2008) Benign breast biopsy 1996 EGD Mod Gastritis, Erosive Esophagitis  Bx B9  (Dr Russella Dar) 03/04/2009 (03/08/2009 17:06)       Past Surgical History:    DETACHED RETINA REPAIR OS    CHOLEYCYSTECTOMY 1985    GYN-- VAGINAL ULTRASOUND:(09/1996)    T.A.H. --B.S.O. -- UTERINE CANCER:9DR FONTAINE)(12/22/1996)    DEXA SCAN-- --MIN OSTEOPENIA:( 01/1999)    HOSPITAL [facility]- PNEUMONIA--HYPOKALEMIA--NAUSEA AND VOMITING:(04/28-30/2002)    COLONOSCOPY --DIVERICS, INTERNAL HEMORRHOIDS:(12/01/2001)    ABD. CT-- NORMAL:(10/10/2001)    DEXA -- IMPROVED// NOT OSTEOPENIA:(09/20/2004)    Release of R Long finger A1 Pulley, Excision  Myxoid Dorsal mass R Index Finger (Dr Teressa Senter) (01/23/2008)    Benign breast biopsy 1996    EGD Mod Gastritis, Erosive Esophagitis  Bx B9  (Dr Russella Dar) 03/04/2009

## 2010-02-01 NOTE — Assessment & Plan Note (Signed)
Summary: stomach pain/hmw   Vital Signs:  Patient profile:   77 year old female Weight:      154.50 pounds Temp:     98.5 degrees F oral Pulse rate:   68 / minute Pulse rhythm:   regular BP sitting:   128 / 70  (left arm) Cuff size:   regular  Vitals Entered By: Sydell Axon LPN (January 28, 2009 12:30 PM) CC: Stomach pain, upper   History of Present Illness: Pt here for continued abd discomfort in the epigastric area, worse at night. She had problems last Fall and was seen, Put on PPI tx and went on trip to [place] and [place]. Sxs better but still evident while there and have continued since beiong back. She describes them as pain knawing in Xyphoid area, somewhat assoc w/ eating, happens at night, not gone with PPI. Has never seen GI or had EGD.  Problems Prior to Update: 1)  Traveler's Diarrhea  (ICD-009.2) 2)  Gerd  (ICD-530.81) 3)  Abdominal Pain, Epigastric  (ICD-789.06) 4)  Tibialis Tendinitis  (ICD-726.72) 5)  Abdominal Pain, Left Lower Quadrant  (ICD-789.04) 6)  Pain in Joint, Multiple Sites  (ICD-719.49) 7)  Intermittent Claudication, Left Leg  (ICD-443.9) 8)  Special Screening Malig Neoplasms Other Sites  (ICD-V76.49) 9)  Malignant Neoplasm of Uterus Part Unspecified  (ICD-179) 10)  Diverticulosis, Colon  (ICD-562.10) 11)  Trigger Finger (DR SYPHER)  (ICD-727.03) 12)  Osteopenia  (ICD-733.90) 13)  Impaired Fasting Glucose  (ICD-790.21) 14)  Hypertension  (ICD-401.9) 15)  Hyperlipidemia, Mixed  (ICD-272.2) 16)  Hypothyroidism  (ICD-244.9) 17)  Hip Pain, Bilat  (ICD-719.45) 18)  Preventive Health Care  (ICD-V70.0)  Medications Prior to Update: 1)  Premarin 0.3 Mg  Tabs (Estrogens Conjugated) .Marland Kitchen.. 1 Once Daily By Mouth 2)  Tenormin 25 Mg  Tabs (Atenolol) .Marland Kitchen.. 1  Daily By Mouth 3)  Hydrochlorothiazide 25 Mg  Tabs (Hydrochlorothiazide) .... 1/2 Tablet Once Daily  By Mouth 4)  Levoxyl 88 Mcg  Tabs (Levothyroxine Sodium) .Marland Kitchen.. 1 Daily By Mouth 5)  Bayer Low  Strength 81 Mg  Tbec (Aspirin) .Marland Kitchen.. 1 Tablet Daily By Mouth 6)  Calcium 500/d 500-125 Mg-Unit  Tabs (Calcium Carbonate-Vitamin D) .Marland Kitchen.. 1 Tablet Twice A Day By Mouth 7)  Crestor 10 Mg  Tabs (Rosuvastatin Calcium) .... One Tab By Mouth At Bedtime 8)  Lomotil 2.5-0.025 Mg Tabs (Diphenoxylate-Atropine) .... One Tab By Mouth Once Daily As Needed Prolonged Diarrhea  Allergies: 1)  ! * Novacaine 2)  ! Morphine 3)  ! Macrodantin 4)  ! Vytorin 5)  ! * Bandaids  Physical Exam  General:  Well-developed,well-nourished,in no acute distress; alert,appropriate and cooperative throughout examination Head:  Normocephalic and atraumatic without obvious abnormalities. No apparent alopecia or balding. Eyes:  Conjunctiva clear bilaterally.  Ears:  External ear exam shows no significant lesions or deformities.  Otoscopic examination reveals clear canals, tympanic membranes are intact bilaterally without bulging, retraction, inflammation or discharge. Hearing is grossly normal bilaterally. Nose:  External nasal examination shows no deformity or inflammation. Nasal mucosa are pink and moist without lesions or exudates. Mouth:  Oral mucosa and oropharynx without lesions or exudates.  Teeth in good repair. Neck:  No deformities, masses, or tenderness noted. Lungs:  Normal respiratory effort, chest expands symmetrically. Lungs are clear to auscultation, no crackles or wheezes. Heart:  Normal rate and regular rhythm. S1 and S2 normal without gallop, murmur, click, rub or other extra sounds. Abdomen:  mild epigastric discomfort, none now but  points to the xyphoid area. soft, normal bowel sounds, no distention, no masses, no guarding, no rigidity, no rebound tenderness, no abdominal hernia, no inguinal hernia, no hepatomegaly, and no splenomegaly.     Impression & Recommendations:  Problem # 1:  GERD (ICD-530.81) Assessment Unchanged Presumed but cont in the favce of PPI. Discussed prophylaxis, some of which she  does not do but would like to see GI. Will refer. Orders: Gastroenterology Referral (GI)  Complete Medication List: 1)  Premarin 0.3 Mg Tabs (Estrogens conjugated) .Marland Kitchen.. 1 once daily by mouth 2)  Tenormin 25 Mg Tabs (Atenolol) .Marland Kitchen.. 1  daily by mouth 3)  Hydrochlorothiazide 25 Mg Tabs (Hydrochlorothiazide) .... 1/2 tablet once daily  by mouth 4)  Levoxyl 88 Mcg Tabs (Levothyroxine sodium) .Marland Kitchen.. 1 daily by mouth 5)  Bayer Low Strength 81 Mg Tbec (Aspirin) .Marland Kitchen.. 1 tablet daily by mouth 6)  Calcium 500/d 500-125 Mg-unit Tabs (Calcium carbonate-vitamin d) .Marland Kitchen.. 1 tablet twice a day by mouth 7)  Crestor 10 Mg Tabs (Rosuvastatin calcium) .... One tab by mouth at bedtime 8)  Lomotil 2.5-0.025 Mg Tabs (Diphenoxylate-atropine) .... One tab by mouth once daily as needed prolonged diarrhea  Patient Instructions: 1)  Refer to GI  Current Allergies (reviewed today): ! * NOVACAINE ! MORPHINE ! MACRODANTIN ! VYTORIN ! * BANDAIDS

## 2010-02-01 NOTE — Assessment & Plan Note (Signed)
Summary: GERD--ch.   History of Present Illness Visit Type: consult Primary GI MD: Elie Goody MD [facility] Primary Provider: Laurita Quint, MD Requesting Provider: Laurita Quint, MD Chief Complaint: epigastric pain that is worse when laying down.  Pt has started taking bedtime meds after supper, but has not noticed any improvement in pain History of Present Illness:   This is a 77 year old female, who notes epigastric pain, associated with belching, mainly at night, and first thing in the morning. Her symptoms tend to occur when she is recumbent. She took her husbands Nexium 40 mg daily for approximately 6 weeks with no change in symptoms.   GI Review of Systems    Reports abdominal pain and  belching.     Location of  Abdominal pain: epigastric area.    Denies acid reflux, bloating, chest pain, dysphagia with liquids, dysphagia with solids, heartburn, loss of appetite, nausea, vomiting, vomiting blood, weight loss, and  weight gain.      Reports hemorrhoids.     Denies anal fissure, black tarry stools, change in bowel habit, constipation, diarrhea, diverticulosis, fecal incontinence, heme positive stool, irritable bowel syndrome, jaundice, light color stool, liver problems, rectal bleeding, and  rectal pain. Preventive Screening-Counseling & Management  Alcohol-Tobacco     Smoking Status: never      Drug Use:  no.     Current Medications (verified): 1)  Premarin 0.3 Mg  Tabs (Estrogens Conjugated) .Marland Kitchen.. 1 Once Daily By Mouth 2)  Tenormin 25 Mg  Tabs (Atenolol) .Marland Kitchen.. 1  Daily By Mouth 3)  Hydrochlorothiazide 25 Mg  Tabs (Hydrochlorothiazide) .... 1/2 Tablet Once Daily  By Mouth 4)  Levoxyl 88 Mcg  Tabs (Levothyroxine Sodium) .Marland Kitchen.. 1 Daily By Mouth 5)  Bayer Low Strength 81 Mg  Tbec (Aspirin) .Marland Kitchen.. 1 Tablet Daily By Mouth 6)  Calcium 500/d 500-125 Mg-Unit  Tabs (Calcium Carbonate-Vitamin D) .Marland Kitchen.. 1 Tablet Twice A Day By Mouth 7)  Crestor 10 Mg  Tabs (Rosuvastatin Calcium) .... One  Tab By Mouth At Bedtime 8)  Lomotil 2.5-0.025 Mg Tabs (Diphenoxylate-Atropine) .... One Tab By Mouth Once Daily As Needed Prolonged Diarrhea  Allergies (verified): 1)  ! * Novacaine 2)  ! Morphine 3)  ! Macrodantin 4)  ! Vytorin 5)  ! * Bandaids  Past History:  Past Medical History: Reviewed history from 02/22/2009 and no changes required. Hypothyroidism Hemorrhoids Hyperlipidemia Hypertension Uterine Cancer Diverticulosis GERD  Past Surgical History: Reviewed history from 02/22/2009 and no changes required. DETACHED RETINA REPAIR OS CHOLEYCYSTECTOMY 1985 GYN-- VAGINAL ULTRASOUND:(09/1996) [facility] -- UTERINE CANCER:9DR FONTAINE)(12/22/1996) DEXA SCAN-- --MIN OSTEOPENIA:( 01/1999) HOSPITAL [facility]- PNEUMONIA--HYPOKALEMIA--NAUSEA AND VOMITING:(04/28-30/2002) COLONOSCOPY --DIVERICS, INTERNAL HEMORRHOIDS:(12/01/2001) ABD. CT-- NORMAL:(10/10/2001) DEXA -- IMPROVED// NOT OSTEOPENIA:(09/20/2004) Release of R Long finger A1 Pulley, Excision Myxoid Dorsal mass R Index Finger (Dr Teressa Senter) (01/23/2008) Benign breast biopsy 1996  Family History: Reviewed history from 05/28/2007 and no changes required. Father: dec 63   MI Mother: (MARGAERET GOURLEY) dec  44  SISTER dec  FROM CERVICAL CANCER  CV: + FATHER MI HBP: + SELF DM: NEGATIVE GOUT/ARTHRITIS:  PROSTATE CANCER: CERVICAL CANCERa: + SISTER DECEASED FROM IT// + SELF UTERINE CANCER BREAST CANCER: NEGATIVE DEPRESSION:  ETOH/DRUG ABUSE:  OTHER : + STROKE GRANDMOTHER  Social History: Marital Status: Married  LIVES WITH HUSBAND Children:3 CHILDREN Occupation:  RETIRED Cabin crew Patient has never smoked.  Daily Caffeine Use 3/day Illicit Drug Use - no  Review of Systems       The patient complains of urination changes/pain.  The pertinent positives and negatives are noted as above and in the HPI. All other ROS were reviewed and were negative.   Vital Signs:  Patient profile:   77 year old  female Height:      63.50 inches Weight:      155.6 pounds BMI:     27.23 Pulse rate:   64 / minute Pulse rhythm:   regular BP sitting:   130 / 66  (left arm) Cuff size:   regular  Vitals Entered By: Francee Piccolo CMA Duncan Dull) (February 23, 2009 1:57 PM)  Physical Exam  General:  Well developed, well nourished, no acute distress. Head:  Normocephalic and atraumatic. Eyes:  PERRLA, no icterus. Ears:  Normal auditory acuity. Mouth:  No deformity or lesions, dentition normal. Neck:  Supple; no masses or thyromegaly. Lungs:  Clear throughout to auscultation. Heart:  Regular rate and rhythm; no murmurs, rubs,  or bruits. Abdomen:  Soft and nondistended. No masses, hepatosplenomegaly or hernias noted. Normal bowel sounds. Epigastric tenderness to mild palpation without rebound or guarding Msk:  Symmetrical with no gross deformities. Normal posture. Pulses:  Normal pulses noted. Extremities:  No clubbing, cyanosis, edema or deformities noted. Neurologic:  Alert and  oriented x4;  grossly normal neurologically. Cervical Nodes:  No significant cervical adenopathy. Inguinal Nodes:  No significant inguinal adenopathy. Psych:  Alert and cooperative. Normal mood and affect.  Impression & Recommendations:  Problem # 1:  ABDOMINAL PAIN, EPIGASTRIC (ICD-789.06) Epigastric pain, associated with belching. Symptoms have some features of reflux, but have not responded to a trial of Nexium. Rule out ulcer disease, GERD, and other disorders. Possible intestinal gas or musculoskeletal symptoms. The risks, benefits and alternatives to endoscopy with possible biopsy and possible dilation were discussed with the patient and they consent to proceed. The procedure will be scheduled electively. Orders: EGD (EGD) TLB-BMP (Basic Metabolic Panel-BMET) (80048-METABOL) TLB-CBC Platelet - w/Differential (85025-CBCD) TLB-Hepatic/Liver Function Pnl (80076-HEPATIC) TLB-TSH (Thyroid Stimulating Hormone)  (84443-TSH) TLB-Lipase (83690-LIPASE)  Problem # 2:  SCREENING COLORECTAL-CANCER (ICD-V76.51) Average risk for colorectal cancer. Ten-year screening colonoscopy recommended in November 2013.  Patient Instructions: 1)  Get your labs drawn today in the basement.  2)  Upper Endoscopy brochure given.  3)  Copy sent to : Laurita Quint, MD 4)  The medication list was reviewed and reconciled.  All changed / newly prescribed medications were explained.  A complete medication list was provided to the patient / caregiver.

## 2010-02-01 NOTE — Progress Notes (Signed)
Summary: med coverage  Phone Note Call from Patient Call back at [facility] Phone [phone]   Caller: Patient Call For: Dr. Russella Dar Reason for Call: Insurance Question Summary of Call: pt states she has been unable to fill Dexilant rx b/c her insurance will not cover it without "Dr. Russella Dar calling them" Initial call taken by: Vallarie Mare,  March 08, 2009 12:43 PM  Follow-up for Phone Call        Informed pt that we have already called the insurance company and filled out the papers and faxed it to them and now we are waiting on a response from them. Pt states she is hurting and would like a respone sooner than later. I told her that I can leave samples out front for her until we get a response. She states she lives far away and would like for me to call her if we get a response today and if we do not then she will come by a pick up samples.  Follow-up by: Christie Nottingham CMA Duncan Dull),  March 08, 2009 1:26 PM  Additional Follow-up for Phone Call Additional follow up Details #1::        Inurance company called to tell me that Dexilant has been approved indefinatley. Pt informed.  Additional Follow-up by: Christie Nottingham CMA Duncan Dull),  March 08, 2009 3:49 PM

## 2010-02-01 NOTE — Procedures (Signed)
Summary: Colonoscopy   Colonoscopy  Procedure date:  11/04/2001  Findings:      Results: Hemorrhoids.     Results: Diverticulosis.       Location:  [facility].    Procedures Next Due Date:    Colonoscopy: 11/2008 Patient Name: Molly [name], Molly [name] MRN:  Procedure Procedures: Colonoscopy CPT: 11914.  Personnel: Endoscopist: Venita Lick. Russella Dar, MD, Clementeen Graham.  Exam Location: Exam performed in Outpatient Clinic. Outpatient  Patient Consent: Procedure, Alternatives, Risks and Benefits discussed, consent obtained, from patient. Consent was obtained by the RN.  Indications  Average Risk Screening Routine.  History  Pre-Exam Physical: Performed Nov 04, 2001. Entire physical exam was normal.  Exam Exam: Extent of exam reached: Cecum, extent intended: Cecum.  The cecum was identified by appendiceal orifice and IC valve. Colon retroflexion performed. ASA Classification: II. Tolerance: excellent.  Monitoring: Pulse and BP monitoring, Oximetry used. Supplemental O2 given.  Colon Prep Used Fleets Prep Kit for colon prep. Prep results: good.  Sedation Meds: Patient assessed and found to be appropriate for moderate (conscious) sedation. Fentanyl 100 mcg. given IV. Versed 5 mg. given IV.  Findings - DIVERTICULOSIS: Sigmoid Colon. Not bleeding. ICD9: Diverticulosis: 562.10. Comments: mild.  NORMAL EXAM: Cecum to Descending Colon.  HEMORRHOIDS: Internal. Size: Small. Not bleeding. Not thrombosed. ICD9: Hemorrhoids, Internal: 455.0.   Assessment  Diagnoses: 562.10: Diverticulosis.  455.0: Hemorrhoids, Internal.   Events  Unplanned Interventions: No intervention was required.  Unplanned Events: There were no complications. Plans Medication Plan: Continue current medications.  Patient Education: Patient given standard instructions for: Diverticulosis. Hemorrhoids.  Disposition: After procedure patient sent to recovery. After recovery patient sent home.    Scheduling/Referral: Colonoscopy, to [facility] T. Russella Dar, MD, [facility], around Nov 04, 2008.  Primary Care Provider, to Laurita Quint, MD,    Patient Name: Molly [name], Molly [name] MRN:  Amendment 1 - Nov 04, 2001 0 Findings DELETED<<NORMAL EXAM: Cecum to Descending Colon.  >>DELETED NORMAL EXAM: Cecum to Descending Colon.    [Fellow] This report was created from the original endoscopy report, which was reviewed and signed by the above listed endoscopist.    cc: Laurita Quint, MD

## 2010-02-01 NOTE — Progress Notes (Signed)
Summary: stomach pain  Phone Note Call from Patient Call back at Home Phone [phone]   Caller: Patient Call For: Shaune Leeks MD Summary of Call: Patient calling says she has seen you for stomach pain before say, her pain has come back wants to know if you want to see her or have her see someone else.Please advise let  patient know Initial call taken by: Benny Lennert CMA Duncan Dull),  January 25, 2009 10:20 AM  Follow-up for Phone Call        I will see her when able. Follow-up by: Shaune Leeks MD,  January 25, 2009 1:31 PM  Additional Follow-up for Phone Call Additional follow up Details #1::        patient has appt thurs 01-28-2009 at 12:30 Additional Follow-up by: Benny Lennert CMA Duncan Dull),  January 25, 2009 2:31 PM

## 2010-02-09 ENCOUNTER — Encounter: Payer: Self-pay | Admitting: Family Medicine

## 2010-03-24 ENCOUNTER — Other Ambulatory Visit: Payer: Self-pay | Admitting: Radiology

## 2010-03-24 ENCOUNTER — Other Ambulatory Visit: Payer: BLUE CROSS/BLUE SHIELD

## 2010-03-24 ENCOUNTER — Other Ambulatory Visit: Payer: Self-pay | Admitting: Family Medicine

## 2010-03-24 ENCOUNTER — Other Ambulatory Visit (INDEPENDENT_AMBULATORY_CARE_PROVIDER_SITE_OTHER): Payer: Medicare Other | Admitting: Family Medicine

## 2010-03-24 ENCOUNTER — Other Ambulatory Visit: Payer: Medicare Other

## 2010-03-24 DIAGNOSIS — E782 Mixed hyperlipidemia: Secondary | ICD-10-CM

## 2010-03-24 DIAGNOSIS — M949 Disorder of cartilage, unspecified: Secondary | ICD-10-CM

## 2010-03-24 DIAGNOSIS — K5730 Diverticulosis of large intestine without perforation or abscess without bleeding: Secondary | ICD-10-CM

## 2010-03-24 DIAGNOSIS — R7301 Impaired fasting glucose: Secondary | ICD-10-CM

## 2010-03-24 DIAGNOSIS — E039 Hypothyroidism, unspecified: Secondary | ICD-10-CM

## 2010-03-24 DIAGNOSIS — M899 Disorder of bone, unspecified: Secondary | ICD-10-CM

## 2010-03-24 DIAGNOSIS — E785 Hyperlipidemia, unspecified: Secondary | ICD-10-CM

## 2010-03-24 LAB — CBC WITH DIFFERENTIAL/PLATELET
Basophils Absolute: 0 10*3/uL (ref 0.0–0.1)
Basophils Relative: 0.3 % (ref 0.0–3.0)
Eosinophils Absolute: 0.1 10*3/uL (ref 0.0–0.7)
Hemoglobin: 13.5 g/dL (ref 12.0–15.0)
Lymphocytes Relative: 56.8 % — ABNORMAL HIGH (ref 12.0–46.0)
MCHC: 34 g/dL (ref 30.0–36.0)
MCV: 93.7 fl (ref 78.0–100.0)
Monocytes Absolute: 0.4 10*3/uL (ref 0.1–1.0)
Neutro Abs: 2 10*3/uL (ref 1.4–7.7)
Neutrophils Relative %: 34.3 % — ABNORMAL LOW (ref 43.0–77.0)
RBC: 4.24 Mil/uL (ref 3.87–5.11)
RDW: 13.4 % (ref 11.5–14.6)
WBC: 5.8 10*3/uL (ref 4.5–10.5)

## 2010-03-24 LAB — BASIC METABOLIC PANEL
Calcium: 9.1 mg/dL (ref 8.4–10.5)
Creatinine, Ser: 0.8 mg/dL (ref 0.4–1.2)
GFR: 78.14 mL/min (ref >60.00)

## 2010-03-24 LAB — MICROALBUMIN / CREATININE URINE RATIO: Microalb Creat Ratio: 0.6 mg/g (ref 0.0–30.0)

## 2010-03-24 LAB — LIPID PANEL
Cholesterol: 314 mg/dL — ABNORMAL HIGH (ref 0–200)
Triglycerides: 225 mg/dL — ABNORMAL HIGH (ref 0.0–149.0)

## 2010-03-24 LAB — T4, FREE: Free T4: 0.85 ng/dL (ref 0.60–1.60)

## 2010-03-26 ENCOUNTER — Encounter: Payer: Self-pay | Admitting: Family Medicine

## 2010-03-26 DIAGNOSIS — K219 Gastro-esophageal reflux disease without esophagitis: Secondary | ICD-10-CM

## 2010-03-26 DIAGNOSIS — C55 Malignant neoplasm of uterus, part unspecified: Secondary | ICD-10-CM

## 2010-03-26 DIAGNOSIS — M899 Disorder of bone, unspecified: Secondary | ICD-10-CM

## 2010-03-26 DIAGNOSIS — R1032 Left lower quadrant pain: Secondary | ICD-10-CM

## 2010-03-30 ENCOUNTER — Ambulatory Visit (INDEPENDENT_AMBULATORY_CARE_PROVIDER_SITE_OTHER): Payer: BLUE CROSS/BLUE SHIELD | Admitting: Family Medicine

## 2010-03-30 ENCOUNTER — Other Ambulatory Visit: Payer: Self-pay | Admitting: *Deleted

## 2010-03-30 ENCOUNTER — Encounter: Payer: Self-pay | Admitting: Family Medicine

## 2010-03-30 DIAGNOSIS — R7301 Impaired fasting glucose: Secondary | ICD-10-CM

## 2010-03-30 DIAGNOSIS — E782 Mixed hyperlipidemia: Secondary | ICD-10-CM

## 2010-03-30 DIAGNOSIS — R1032 Left lower quadrant pain: Secondary | ICD-10-CM

## 2010-03-30 DIAGNOSIS — K219 Gastro-esophageal reflux disease without esophagitis: Secondary | ICD-10-CM

## 2010-03-30 DIAGNOSIS — I10 Essential (primary) hypertension: Secondary | ICD-10-CM

## 2010-03-30 DIAGNOSIS — A09 Infectious gastroenteritis and colitis, unspecified: Secondary | ICD-10-CM

## 2010-03-30 DIAGNOSIS — E039 Hypothyroidism, unspecified: Secondary | ICD-10-CM

## 2010-03-30 DIAGNOSIS — R1013 Epigastric pain: Secondary | ICD-10-CM

## 2010-03-30 MED ORDER — SIMVASTATIN 20 MG PO TABS: 20.0000 mg | ORAL_TABLET | Freq: Every day | ORAL | Status: DC

## 2010-03-30 NOTE — Assessment & Plan Note (Signed)
Euglycemic this year. Will cont to monitor. Discussed low sweet/carb diet.

## 2010-03-30 NOTE — Assessment & Plan Note (Addendum)
LDL too high, direct 219. Cont Co Q 10 and add Simva 20 as trial of toleration. Lab Results  Component Value Date   CHOL 314* 03/24/2010   CHOL 196 06/18/2008   CHOL 165 11/12/2007   Lab Results  Component Value Date   HDL 67.60 03/24/2010   HDL 16.10 06/18/2008   HDL 96.0 11/12/2007   Lab Results  Component Value Date   LDLCALC 96 06/18/2008   LDLCALC 84 11/12/2007   Lab Results  Component Value Date   TRIG 225.0* 03/24/2010   TRIG 127.0 06/18/2008   TRIG 109 11/12/2007   Lab Results  Component Value Date   CHOLHDL 5 03/24/2010   CHOLHDL 3 06/18/2008   CHOLHDL 2.8 CALC 11/12/2007

## 2010-03-30 NOTE — Assessment & Plan Note (Signed)
Essentially resolved. 

## 2010-03-30 NOTE — Assessment & Plan Note (Addendum)
Well controlled. Cont curr dose. Lab Results  Component Value Date   TSH 0.41 03/24/2010

## 2010-03-30 NOTE — Assessment & Plan Note (Signed)
Resolved

## 2010-03-30 NOTE — Assessment & Plan Note (Signed)
Discussed avoiding trigger foods. Sxs currently acceptable to her.

## 2010-03-30 NOTE — Assessment & Plan Note (Addendum)
Acceptable, cont curr meds. BP Readings from Last 3 Encounters:  03/30/10 132/62  02/23/09 130/66  01/28/09 128/70

## 2010-03-30 NOTE — Patient Instructions (Addendum)
SGOT, SGPT 272.4    6 WEEKS See me in 3 mos, SGOT, SGPT, CHOL PROFILE  272.4     Prior. Start Simvastatin 20mg  at night. Regularly...stay on  Co Q 10. GERD can be treated with medication but also with lifestyle changes including avoidance of late meals, excess alcohol, smoking cessation, avoiding spicy foods, chocolate, peppermint, colas, red wine and  acidic juices such as orange or tomato juice. Do not take mint or menthol products, use sugarless candy instead (Jolly Ranchers)

## 2010-03-30 NOTE — Progress Notes (Signed)
  Subjective:    Patient ID: Molly [name], female    DOB: Apr 29, 1929, 77 y.o.   MRN: [id number]  HPI Pt here for Comp Exam. She feels well except for right hand finger discomfort for which she has been seeing Dr Teressa Senter...she had been waking in the middle of the night with finger pain in various fingers. She is better since stopping the Crestor. She had been on Zocor for years many years ago without problems and is willing to start back.    Review of Systems  Constitutional: Negative for fever, chills, diaphoresis, fatigue and unexpected weight change.  HENT: Negative for ear pain, rhinorrhea, trouble swallowing and tinnitus. Hearing loss: left ear hearing poor.   Eyes: Negative for pain, discharge and visual disturbance.  Respiratory: Negative for cough, shortness of breath and wheezing.   Cardiovascular: Negative for chest pain, palpitations and leg swelling.       [No Fainting or Fatigue. Gastrointestinal: Negative for nausea, vomiting, abdominal pain, diarrhea, constipation and blood in stool.       [No Heartburn Genitourinary: Negative for dysuria, frequency and vaginal bleeding.       [Has urinary leakage. Musculoskeletal: Negative for myalgias, back pain and arthralgias.  Skin: Negative for rash.       [No Itching or Dryness. Neurological: Negative for tremors and numbness.       [No Tingling. No Balance Problems. Hematological: Negative for adenopathy. Does not bruise/bleed easily.  Psychiatric/Behavioral: Negative for dysphoric mood and agitation.       Objective:   Physical Exam  Constitutional: She is oriented to person, place, and time. She appears well-developed and well-nourished. No distress.  HENT:  Head: Normocephalic and atraumatic.  Right Ear: External ear normal.  Left Ear: External ear normal.  Nose: Nose normal.  Mouth/Throat: Oropharynx is clear and moist.  Eyes: Conjunctivae and EOM are normal. Pupils are equal, round, and reactive to light. No scleral  icterus.  Neck: Normal range of motion. Neck supple. No thyromegaly present.  Cardiovascular: Normal rate, regular rhythm, normal heart sounds and intact distal pulses.   No murmur heard. Pulmonary/Chest: Effort normal and breath sounds normal. No respiratory distress. She has no wheezes.  Abdominal: Soft. Bowel sounds are normal. She exhibits no mass. There is no tenderness.       Guaiac neg.  Genitourinary: Vagina normal and uterus normal. Guaiac negative stool.       Bimanual only done: Introitus Nml Cervix Nml without tenderness Ovaries Not Felt   Musculoskeletal: Normal range of motion.       Back Nontender in the Midline Mild nodularity of the finger joints sporadically.  Lymphadenopathy:    She has no cervical adenopathy.  Neurological: She is alert and oriented to person, place, and time. She exhibits normal muscle tone. Coordination normal.  Skin: Skin is warm and dry. No rash noted. She is not diaphoretic.  Psychiatric: She has a normal mood and affect. Her behavior is normal. Judgment and thought content normal.          Assessment & Plan:   Annual recheck.

## 2010-04-04 ENCOUNTER — Other Ambulatory Visit: Payer: Self-pay

## 2010-04-04 MED ORDER — DEXLANSOPRAZOLE 60 MG PO CPDR: 60.0000 mg | DELAYED_RELEASE_CAPSULE | ORAL | Status: DC

## 2010-04-04 NOTE — Telephone Encounter (Signed)
Refill has been sent.  °

## 2010-04-05 ENCOUNTER — Other Ambulatory Visit: Payer: Self-pay | Admitting: *Deleted

## 2010-04-05 MED ORDER — HYDROCHLOROTHIAZIDE 25 MG PO TABS: ORAL_TABLET | ORAL | Status: DC

## 2010-04-13 ENCOUNTER — Other Ambulatory Visit: Payer: Self-pay | Admitting: *Deleted

## 2010-04-13 MED ORDER — LEVOTHYROXINE SODIUM 88 MCG PO TABS: 88.0000 ug | ORAL_TABLET | Freq: Every day | ORAL | Status: DC

## 2010-04-13 MED ORDER — ATENOLOL 25 MG PO TABS: 25.0000 mg | ORAL_TABLET | Freq: Every day | ORAL | Status: DC

## 2010-04-18 LAB — POCT HEMOGLOBIN-HEMACUE
Hemoglobin: 11.4 g/dL — ABNORMAL LOW (ref 12.0–15.0)
Hemoglobin: 8.2 g/dL — ABNORMAL LOW (ref 12.0–15.0)

## 2010-04-18 LAB — BASIC METABOLIC PANEL
Calcium: 9.5 mg/dL (ref 8.4–10.5)
Creatinine, Ser: 0.69 mg/dL (ref 0.4–1.2)
GFR calc Af Amer: >60 mL/min (ref >60)
GFR calc non Af Amer: >60 mL/min (ref >60)

## 2010-05-06 ENCOUNTER — Other Ambulatory Visit: Payer: Self-pay | Admitting: Family Medicine

## 2010-05-06 DIAGNOSIS — E785 Hyperlipidemia, unspecified: Secondary | ICD-10-CM

## 2010-05-11 ENCOUNTER — Other Ambulatory Visit (INDEPENDENT_AMBULATORY_CARE_PROVIDER_SITE_OTHER): Payer: Medicare Other | Admitting: Family Medicine

## 2010-05-11 DIAGNOSIS — E785 Hyperlipidemia, unspecified: Secondary | ICD-10-CM

## 2010-05-17 NOTE — Op Note (Signed)
Molly [name], Molly [name]              ACCOUNT NO.:  [id number]   MEDICAL RECORD NO.:  [id number]          PATIENT TYPE:  AMB   LOCATION:  DSC                          FACILITY:  [facility]   PHYSICIAN:  Katy Fitch. Sypher, M.D. DATE OF BIRTH:  Sep 17, 1929   DATE OF PROCEDURE:  01/23/2008  DATE OF DISCHARGE:                               OPERATIVE REPORT   PREOPERATIVE DIAGNOSES:  1. Chronic stenosing tenosynovitis, right long finger at A1 pulley.  2. Enlarging mass, dorsal aspect of right index finger proximal      phalangeal segment.   POSTOPERATIVE DIAGNOSES:  1. Stenosing tenosynovitis, right long finger with moderate fibrotic      tenosynovitis adhering superficialis and profundus tendons.  2. 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   OPERATION:  1. Release of right long finger A1 pulley.  2. Excision of a myxoid mass from the dorsal venous plexus of the      right index finger proximal phalangeal segment.   OPERATOR:  Katy Fitch. Sypher, MD   ASSISTANT:  Marveen Reeks Dasnoit, PA   ANESTHESIA:  Lidocaine 2% metacarpal head level block of the right long  and index fingers.   SUPERVISING ANESTHESIOLOGIST:  Zenon Mayo, MD   INDICATIONS:  Molly [name] is a 77 year old woman referred through the  courtesy of Dr. Laurita Quint of [facility]  She was seen  for evaluation in the office and noted to have a chronic trigger finger  and a mass over the dorsal aspect of her right index finger proximal  phalanx.  We advised treatment options and she decided to proceed with  excisional biopsy of the mass and release of the A1 pulley.  Preoperatively, questions were invited and answered in detail.   PROCEDURE:  Molly [name] was brought to the operating room and placed  in supine position on the operating table.   Following the induction of general sedation, the right arm was prepped  with Betadine soap solution and  sterilely draped.  A pneumatic  tourniquet was applied at the proximal right brachium.  Following  exsanguination of the right arm with an Esmarch bandage, the arterial  tourniquet was inflated to 270 mmHg due to mild systolic hypertension.  Lidocaine 2% was infiltrated in the path of the proper digital nerves  and dorsal cutaneous sensory branches of the index and long fingers,  leading to a very satisfactory digital blocks.  The procedure commenced  with a short oblique incision in the distal palmar crease exposing the  flexor sheath.  The palmar fascia, pretendinous fibers were released  with scissors followed by isolation of the A1 pulley.  The pulley was  fibrotic with edema of the flexor tendons proximally.  The pulley was  split with scalpel and scissors.  The tendons delivered and fibrotic  adhesions between the profundus superficialis tendons relieved with a  fine rongeur.  This wound was then repaired with mattress suture of 5-0  nylon.  Full passive range of motion of the fingers recovered.  Attention was then  directed to the index finger.  An oblique incision  was fashioned over the 1.3-cm mass in the dorsal aspect of the proximal  phalangeal segment.  Subcutaneous dissection revealed a plexus of veins  with a large myxoid cyst in the wall of one of the large-caliber veins.  This was circumferentially dissected and removed the margin of 3-5 mm on  the feeding veins.  These were electrocauterized with bipolar forceps.   This wound was repaired with interrupted suture of 5-0 nylon.   A compressive dressing was applied with Xeroflo, sterile gauze, Coban,  and Ace wrap.  There were no apparent complications.   Ms. Shuffler tolerated the surgery and anesthesia well.  She was  transferred to the recovery room with stable signs.   NOTE FOR AFTERCARE:  She was provided a prescription for Darvocet-N 100  one p.o. q.4-6 h. p.r.n. pain, 20 tablets without refill.      Katy Fitch  Sypher, M.D.  Electronically Signed     RVS/MEDQ  D:  01/23/2008  T:  01/23/2008  Job:  [id number]   cc:   Arta Silence, MD

## 2010-05-19 ENCOUNTER — Other Ambulatory Visit: Payer: Self-pay | Admitting: *Deleted

## 2010-05-19 MED ORDER — HYDROCHLOROTHIAZIDE 25 MG PO TABS: ORAL_TABLET | ORAL | Status: DC

## 2010-05-19 MED ORDER — ATENOLOL 25 MG PO TABS: 25.0000 mg | ORAL_TABLET | Freq: Every day | ORAL | Status: DC

## 2010-05-20 NOTE — H&P (Signed)
NAMECARMAN, AUXIER              ACCOUNT NO.:  [id number]   MEDICAL RECORD NO.:  [id number]          PATIENT TYPE:  OIB   LOCATION:  2899                         FACILITY:  [facility]   PHYSICIAN:  Guadelupe Sabin, M.D.DATE OF BIRTH:  [birth year]   DATE OF ADMISSION:  07/22/2004  DATE OF DISCHARGE:                                HISTORY & PHYSICAL   This was a planned outpatient readmission of this 77 year old white female  admitted for cataract implant surgery of the left eye.   PRESENT ILLNESS:  This patient has a long history of high myopia in both  eyes and secondary retinal detachments.  She has undergone previous  bilateral scleral buckling procedures of both eyes and vitrectomy surgery of  the right eye.  She has had previous cataract implant surgery of the right  eye on October 27, 1996.  Patient has done reasonably well until recently  when she developed increasing blurred vision in the left eye due to cataract  formation.  She has elected to proceed with similar cataract implant surgery  at this time.  She was given oral discussion and printed information  concerning the procedure and its possible complications.  She signed an  informed consent and arrangements made for her outpatient admission at this  time.  The patient's symptomatic complaints were that she could not see  television clearly, driving was difficult seeing road signs and she was  hoping for improved vision.   PAST MEDICAL HISTORY:  Patient is in stable general health.  Is felt to be  in satisfactory condition for the proposed surgery under local anesthesia.   REVIEW OF SYSTEMS:  No cardiorespiratory complaints.   PHYSICAL EXAMINATION:  VITAL SIGNS:  Blood pressure 141/76, pulse 69,  respirations 16, temperature 97.2.  GENERAL APPEARANCE:  Patient is a pleasant well-nourished, well-developed  white female in no acute ocular distress.  HEENT:  Eyes:  Visual acuity 20/30 right eye, 20/300 left eye.   Applanation  tonometry:  18 mm each eye.  Slit lamp examination:  The eyes are white and  clear with slight ptosis of the left upper lid.  A posterior chamber implant  is present in the right eye and a dense nuclear cataract in the left eye.  Dilated detailed fundus examination shows a clear vitreous behind the hazy  cataract.  Old peripheral scleral buckling indentation is present.  The  optic nerve, blood vessels, and macula appear normal.  CHEST:  Lungs:  Clear to percussion and auscultation.  HEART:  Normal sinus rhythm.  No cardiomegaly.  No murmurs.  ABDOMEN:  Negative.  EXTREMITIES:  Negative.   ADMISSION DIAGNOSIS:  Senile cataract left eye, status post previous retinal  detachment surgery, both eyes, pseudophakia right eye.   SURGICAL PLAN:  Cataract implant surgery left eye.       HNJ/MEDQ  D:  07/22/2004  T:  07/22/2004  Job:  [id number]

## 2010-05-20 NOTE — Op Note (Signed)
   NAME:  ANNLOUISE, GERETY                        ACCOUNT NO.:  [id number]   MEDICAL RECORD NO.:  [id number]                   PATIENT TYPE:  AMB   LOCATION:  DSC                                  FACILITY:  [facility]   PHYSICIAN:  Consuello Bossier., M.D.         DATE OF BIRTH:  08-Oct-1929   DATE OF PROCEDURE:  06/26/2002  DATE OF DISCHARGE:                                 OPERATIVE REPORT   PREOPERATIVE DIAGNOSIS:  A 2.2 cm draining inclusion cyst, right neck.   POSTOPERATIVE DIAGNOSIS:  A 2.2 cm draining inclusion cyst, right neck.   OPERATION:  Excision of 2.2 cm draining inclusion cyst.   SURGEON:  Pleas Patricia, M.D.   ANESTHESIA:  Xylocaine 2% with epinephrine 1:100,000.   FINDINGS:  The patient had an inclusion cyst in the right neck, which was  somewhat fibrotic and was draining.  It was felt best to try to remove it,  which was able to be done intact with what appeared to be an entire wall,  and the wound closed primarily.   PROCEDURE:  The patient was brought to the operating room, marked off for  the planned elliptical incision in a wrinkle line.  She was prepped with  Betadine and draped sterilely.  She was anesthetized with Xylocaine 2% with  epinephrine 1:100,000.  Following the onset of anesthesia, the elliptical  excision around the punctum was performed.  There was some inflammation  around the wall of the cyst, but it was able to be removed intact.  The  wound was closed with interrupted and running 6-0 Prolene, trying to reduce  the dead space previously occupied by the spherical lesion.  Neosporin  ointment, a light compressive dressing was applied.  The patient will return  in one week for suture removal or before if any problems.                                               Consuello Bossier., M.D.    HH/MEDQ  D:  06/26/2002  T:  06/28/2002  Job:  [id number]

## 2010-05-20 NOTE — Discharge Summary (Signed)
[facility]  Patient:    Molly [name], Molly [name]                     MRN: [id number] Adm. Date:  [id number] Disc. Date: [id number] Attending:  Rogelia Boga CC:         Laurita Quint, M.D. [facility]   Discharge Summary  FINAL DIAGNOSES: 1. Community-acquired pneumonia. 2. Hypokalemia. 3. Intractable nausea and vomiting.  ADDITIONAL DIAGNOSES: 1. Hypertension. 2. Hypothyroidism. 3. Hypercholesterolemia.  DISCHARGE MEDICATIONS: 1. Ceftin 250 mg b.i.d. for eight days. 2. Premarin 0.625 mg daily. 3. Levoxyl 0.08 mg daily. 4. Zocor 10 mg daily. 5. Premarin 0.625 mg daily. 6. Hydrochlorothiazide will be held.  HISTORY OF PRESENT ILLNESS:  The patient is a 77 year old white female who was seen in the emergency room initially for chest congestion and cough and a right lower lobe pneumonia was diagnosed.  The patient received some parenteral antibiotics in the ER and was discharged to complete home therapy. However, she returned after nausea and vomiting and inability to tolerate the oral antibiotic.  The patient was subsequently admitted for further evaluation and treatment.  LABORATORY DATA AND HOSPITAL COURSE:  The patient was admitted to the hospital and treated with IV and oral potassium replacement.  The potassium level was 2.6.  The white count was 10.7.  Other laboratory studies were unremarkable, except for a random blood sugar of 137.  The patient was treated with pulmonary toilet that included nebulizer treatments and received IV Tequin 400 mg daily, which was started in the ER.  She was maintained on her preadmission medications, except the hydrochlorothiazide was held.  Blood pressure remained in the low normal range during her brief hospital period. She was treated with prophylactic subcutaneous heparin.  DISPOSITION:  The patient will be discharged to return for office follow-up in two weeks.  A repeat chest x-ray will be considered at  that time.  At the time of discharge, her white count had normalized and the potassium level was 3.7.  CONDITION ON DISCHARGE:  Improved. DD:  05/01/00 TD:  05/01/00 Job: [id number]

## 2010-05-20 NOTE — H&P (Signed)
[facility]  Patient:    Molly [name], Molly [name]                     MRN: [id number] Adm. Date:  [id number] Attending:  Rogelia Boga CC:         Laurita Quint, M.D. [facility]   History and Physical  CHIEF COMPLAINT:  Nausea and vomiting.  HISTORY OF PRESENT ILLNESS:  The patient is a 77 year old white female who was evaluated in the emergency department earlier this evening.  She presented with fever and chest congestion of three days duration.  Chest x-ray confirmed a right lower lobe pneumonia.  Patients oxygen saturation was in the low to mid-90s and a WBC was minimally elevated with a left shift.  It was initially elected to treat the patient as an outpatient and she received a single dose of Tequin by mouth but shortly after returning home, began having nausea and vomiting and increasing weakness and presented back to the emergency room for further evaluation.  Repeat laboratory studies revealed worsening of hypokalemia and the patient is now admitted for further IV fluid support, IV antibiotics and more aggressive treatment of her right lower lobe pneumonia.  PAST MEDICAL HISTORY:  The patient has a history of hypertension, hypercholesterolemia and hypothyroidism.  She has had considerable problems with detached retinae and other complications and has been treated extensively by Dr. Guadelupe Sabin and also at [facility].  She has had a remote cholecystectomy and more recently a hysterectomy in 1998.  She is a gravida 3, para 3, abortus 0.  ALLERGIES:  MORPHINE SULFATE, NOVOCAINE and MACRODANTIN.  PRESENT MEDICAL REGIMEN: 1. Premarin 0.625 mg daily. 2. Hydrochlorothiazide 25 mg daily. 3. Zocor 10 mg daily. 4. Levoxyl unclear dose, one daily.  FAMILY HISTORY:  Apparently noncontributory.  Father died at 30 of an acute myocardial infarction.  Mother age 51.  SOCIAL HISTORY:  The patient is married.  A son works as a Event organiser.  Patient is a nonsmoker.  PHYSICAL EXAMINATION:  VITAL SIGNS:  Temperature 97.6, although febrile on her initial ER visit. Blood pressure 103/54.  Pulse rate 85.  GENERAL:  Exam revealed a well-developed, healthy-appearing white female who is slightly weak but in no acute distress.  SKIN:  Unremarkable without rash.  HEENT:  Slight left-sided ptosis.  Pupillary responses were normal.  ENT unremarkable.  Oropharynx was slightly dry.  NECK:  No bruits, adenopathy or thyroid enlargement.  CHEST:  Clear lung fields on the left.  There are extensive rales and rhonchi involving the right lower chest fields.  CARDIOVASCULAR:  S1 and S2 normal.  No tachycardia.  No murmurs or gallops appreciated.  BREASTS:  Exam negative.  ABDOMEN:  Soft and nontender.  Well-healed surgical scars are present in the right upper quadrant and lower abdomen.  No organomegaly, masses or tenderness.  There is no distention.  EXTREMITIES:  Full peripheral pulses.  No edema.  NEUROLOGIC:  Negative.  IMPRESSION: 1. Right lower lobe pneumonia. 2. Nausea and vomiting. 3. Hypokalemia. 4. Hypertension. 5. Hypercholesterolemia. 6. Hypothyroidism.  DISPOSITION:  The patient will be admitted to the hospital.  She will be treated with IV fluid support and potassium supplement.  She will receive parenteral antibiotics and intensive pulmonary toilet.  DD:  04/30/00 TD:  04/30/00 Job: [id number]

## 2010-05-20 NOTE — Op Note (Signed)
NAMEJOSELINE, [name]              ACCOUNT NO.:  [id number]   MEDICAL RECORD NO.:  [id number]          PATIENT TYPE:  OIB   LOCATION:  2899                         FACILITY:  [facility]   PHYSICIAN:  Guadelupe Sabin, M.D.DATE OF BIRTH:  1929/01/10   DATE OF PROCEDURE:  07/22/2004  DATE OF DISCHARGE:                                 OPERATIVE REPORT   PREOPERATIVE DIAGNOSIS:  Senile nuclear cataract left eye.   POSTOPERATIVE DIAGNOSIS:  Senile nuclear cataract left eye.   OPERATION:  Planned extracapsular cataract extraction - phacoemulsification,  primary insertion of posterior chamber intraocular lens implant.   SURGEON:  Guadelupe Sabin, M.D.   ASSISTANT:  Nurse.   ANESTHESIA:  Local 4% Xylocaine, 0.75 Marcaine retrobulbar block with Wydase  added, topical tetracaine, intraocular Xylocaine, anesthesia standby  required.   OPERATIVE PROCEDURE:  After the patient was prepped and draped, a lid  speculum was inserted in the left eye. The eye was turned downward and a  superior rectus traction suture placed. Schiotz tonometry was recorded at 5  scale units with a 5.5 gram weight. A peritomy was performed adjacent to the  limbus from the 11 to 1 o'clock position. The corneoscleral junction was  cleaned and a corneoscleral groove made with a 45 degree Superblade.  The  anterior chamber was then entered with a 2.5 mm diamond keratome at the 12  o'clock position and a 15 degree blade at the 2:30 position.  Using a bent  26-gauge needle on an OcuCoat syringe, a circular capsulorrhexis was begun  and then completed with the Grabow forceps. Hydrodissection and  hydrodelineation were performed using 1% Xylocaine. The 30 degree  phacoemulsification tip was then inserted with slow controlled  emulsification of the lens nucleus.  Total ultrasonic time 52 seconds,  average power level 17%, total amount of fluid used 50 mL.  Following  removal of the nucleus, the residual cortex was aspirated  with the  irrigation-aspiration tip. The posterior capsule appeared intact with a  brilliant red fundus reflex.  It was therefore elected to insert an [employer] SI 40NB silicone three-piece posterior chamber intraocular  lens implant, diopter strength +14.50. This was inserted with the McDonald  forceps into the anterior chamber and then centered into the capsular bag  using the [facility] lens rotator.  The lens appeared to be well-centered. The  Provisc and Occucoat which had been previously used, were aspirated and  replaced with balanced salt solution and Miochol ophthalmic solution. The  incisions appeared to be self-sealing. It was, however, elected to place a  single 10-0 interrupted nylon suture across the longer 12 o'clock incision  to ensure closure and to prevent endophthalmitis. Maxitrol ointment was  instilled in the  conjunctival cul-de-sac and a light patch and protector shield applied.  Duration of procedure and anesthesia administration 45 minutes. The patient  tolerated the procedure well in general, left the operating room for the  recovery room in good condition.       HNJ/MEDQ  D:  07/22/2004  T:  07/22/2004  Job:  [id number]

## 2010-06-28 ENCOUNTER — Other Ambulatory Visit: Payer: Self-pay | Admitting: Family Medicine

## 2010-06-29 ENCOUNTER — Other Ambulatory Visit (INDEPENDENT_AMBULATORY_CARE_PROVIDER_SITE_OTHER): Payer: Medicare Other

## 2010-06-29 DIAGNOSIS — E785 Hyperlipidemia, unspecified: Secondary | ICD-10-CM

## 2010-06-29 LAB — LIPID PANEL
Cholesterol: 211 mg/dL — ABNORMAL HIGH (ref 0–200)
HDL: 68.8 mg/dL (ref >39.00)
Total CHOL/HDL Ratio: 3
VLDL: 37.6 mg/dL (ref 0.0–40.0)

## 2010-06-29 LAB — AST: AST: 22 U/L (ref 0–37)

## 2010-07-11 ENCOUNTER — Other Ambulatory Visit: Payer: Self-pay | Admitting: Family Medicine

## 2010-07-13 ENCOUNTER — Encounter: Payer: Self-pay | Admitting: Family Medicine

## 2010-07-13 ENCOUNTER — Ambulatory Visit (INDEPENDENT_AMBULATORY_CARE_PROVIDER_SITE_OTHER): Payer: Medicare Other | Admitting: Family Medicine

## 2010-07-13 DIAGNOSIS — E782 Mixed hyperlipidemia: Secondary | ICD-10-CM

## 2010-07-13 DIAGNOSIS — I10 Essential (primary) hypertension: Secondary | ICD-10-CM

## 2010-07-13 DIAGNOSIS — M2550 Pain in unspecified joint: Secondary | ICD-10-CM

## 2010-07-13 MED ORDER — SIMVASTATIN 20 MG PO TABS: 40.0000 mg | ORAL_TABLET | Freq: Every day | ORAL | Status: DC

## 2010-07-13 MED ORDER — ESTROGENS CONJUGATED 0.3 MG PO TABS: 0.3000 mg | ORAL_TABLET | Freq: Every day | ORAL | Status: DC

## 2010-07-13 NOTE — Assessment & Plan Note (Signed)
Good control. Cont curr meds. BP Readings from Last 3 Encounters:  07/13/10 122/68  03/30/10 132/62  02/23/09 130/66

## 2010-07-13 NOTE — Assessment & Plan Note (Signed)
Improved but continues.

## 2010-07-13 NOTE — Progress Notes (Signed)
  Subjective:    Patient ID: Molly [name], female    DOB: [birth year], 77 y.o.   MRN: [id number]  HPI Pt here for recheck after starting back on Simva 20mg  as test case. She has no complaints and feels well. She doesn't think the Simvastatin is bothering her as much as Crestor. She has been seeing Dr Teressa Senter and has had shots etc for trigger finger. We discussed risks of Lipids.   Review of Systems Noncontributory except as above.       Objective:   Physical Exam WDWN WF NAD        Assessment & Plan:

## 2010-07-13 NOTE — Assessment & Plan Note (Signed)
Nos not as good as on Crestor. LDL slightly high with Trigs elevated. Discussed avoiding sweets and carbs and incr Simva to 40. Will recheck in 3 mos.  ?Co Q 10? Lab Results  Component Value Date   CHOL 211* 06/29/2010   CHOL 314* 03/24/2010   CHOL 196 06/18/2008   Lab Results  Component Value Date   HDL 68.80 06/29/2010   HDL 43.32 03/24/2010   HDL 95.18 06/18/2008   Lab Results  Component Value Date   LDLCALC 96 06/18/2008   LDLCALC 84 11/12/2007   Lab Results  Component Value Date   TRIG 188.0* 06/29/2010   TRIG 225.0* 03/24/2010   TRIG 127.0 06/18/2008   Lab Results  Component Value Date   CHOLHDL 3 06/29/2010   CHOLHDL 5 03/24/2010   CHOLHDL 3 06/18/2008   Lab Results  Component Value Date   LDLDIRECT 111.2 06/29/2010   LDLDIRECT 219.6 03/24/2010   LDLDIRECT 164.0 05/28/2007

## 2010-07-13 NOTE — Patient Instructions (Signed)
RTC 3 mos with SGOT, SGPT and Lipid Panel 272.0 prior

## 2010-08-16 ENCOUNTER — Encounter (HOSPITAL_BASED_OUTPATIENT_CLINIC_OR_DEPARTMENT_OTHER)
Admission: RE | Admit: 2010-08-16 | Discharge: 2010-08-16 | Disposition: A | Discharge status: Home / Self Care | Payer: Medicare Other | Source: Ambulatory Visit | Attending: Orthopedic Surgery | Admitting: Orthopedic Surgery

## 2010-08-16 LAB — BASIC METABOLIC PANEL
CO2: 32 mEq/L (ref 19–32)
Chloride: 103 mEq/L (ref 96–112)
Creatinine, Ser: 0.6 mg/dL (ref 0.50–1.10)
GFR calc Af Amer: >60 mL/min (ref >60)
Sodium: 140 mEq/L (ref 135–145)

## 2010-08-18 ENCOUNTER — Ambulatory Visit (HOSPITAL_BASED_OUTPATIENT_CLINIC_OR_DEPARTMENT_OTHER)
Admission: RE | Admit: 2010-08-18 | Discharge: 2010-08-18 | Disposition: A | Discharge status: Home / Self Care | Payer: Medicare Other | Source: Ambulatory Visit | Attending: Orthopedic Surgery | Admitting: Orthopedic Surgery

## 2010-08-18 DIAGNOSIS — I10 Essential (primary) hypertension: Secondary | ICD-10-CM | POA: Insufficient documentation

## 2010-08-18 DIAGNOSIS — Z79899 Other long term (current) drug therapy: Secondary | ICD-10-CM | POA: Insufficient documentation

## 2010-08-18 DIAGNOSIS — E039 Hypothyroidism, unspecified: Secondary | ICD-10-CM | POA: Insufficient documentation

## 2010-08-18 DIAGNOSIS — Z01810 Encounter for preprocedural cardiovascular examination: Secondary | ICD-10-CM | POA: Insufficient documentation

## 2010-08-18 DIAGNOSIS — Z01812 Encounter for preprocedural laboratory examination: Secondary | ICD-10-CM | POA: Insufficient documentation

## 2010-08-18 DIAGNOSIS — M19049 Primary osteoarthritis, unspecified hand: Secondary | ICD-10-CM | POA: Insufficient documentation

## 2010-08-18 DIAGNOSIS — M65839 Other synovitis and tenosynovitis, unspecified forearm: Secondary | ICD-10-CM | POA: Insufficient documentation

## 2010-08-18 DIAGNOSIS — M65849 Other synovitis and tenosynovitis, unspecified hand: Secondary | ICD-10-CM | POA: Insufficient documentation

## 2010-08-18 NOTE — Op Note (Signed)
[name], [name]              ACCOUNT NO.:  [id number]  MEDICAL RECORD NO.:  [id number]  LOCATION:                                 FACILITY:  PHYSICIAN:  Katy Fitch. Dontavis Tschantz, M.D.      DATE OF BIRTH:  DATE OF PROCEDURE:  08/18/2010 DATE OF DISCHARGE:                              OPERATIVE REPORT   PREOPERATIVE DIAGNOSES:  Chronic stenosing tenosynovitis, right index, ring, and early stenosing tenosynovitis, right small finger with significant right thumb CMC degenerative arthritis and pain.  POSTOPERATIVE DIAGNOSES:  Chronic stenosing tenosynovitis, right index, ring, and early stenosing tenosynovitis, right small finger with significant right thumb CMC degenerative arthritis and pain.  OPERATION: 1. Release of right index finger A1 pulley with incidental     tenosynovectomy. 2. Release of right ring finger A1 pulley with incidental     tenosynovectomy. 3. Release of right small finger A1 pulley. 4. Inject right thumb carpometacarpal joint with 40 mg of Depo-Medrol     and 1 mL of 2% lidocaine.  OPERATING SURGEON:  Katy Fitch. Annitta Fifield, MD.  ASSISTANT:  Marveen Reeks Dasnoit, PA.  ANESTHESIA:  Monitored anesthesia care with IV sedation and 2% lidocaine anesthesia of index, ring, and small finger A1 pulley regions and flexor sheaths, total volume of anesthesia provided 4 mL of 2% lidocaine, placed by Dr. Teressa Senter.  INDICATIONS:  Molly [name] is a 77 year old woman who is referred for evaluation and management of hand pain due to carpometacarpal arthritis of the thumb and trigger fingers.  She has had a prior release of her right long A1 pulley with an excellent long-term result.  She has had troubling stenosing tenosynovitis of her index and ring fingers and episodic small finger symptoms.  In the holding area, we carefully examined her hand.  She reported that she would prefer to have all of her fingers released so that she did not have to return and as there was  crepitation noted at the small finger, we added release of the A1 pulley of the small finger to her permit. Also, due to the fact that she would be sedated, she requested that we inject her right thumb CMC joint with Depo-Medrol and lidocaine so that she would have relief of her chronic arthritis pain.  After informed consent, she is brought to the operating room at this time.  PROCEDURE:  Molly [name] is brought to room #1 of the [facility] and placed in supine position on the operating table.  Following an anesthesia consult with Dr. Gelene Mink, local anesthesia with sedation was recommended and accepted by Ms. Suzie Portela.  Under Dr. Thornton Dales supervision, IV sedation was provided followed by routine Betadine scrub and paint of the right upper extremity, 2% lidocaine was infiltrated into the path of the intended incisions in the flexor sheaths of the index, ring, and small fingers.  The right thumb carpometacarpal joint was prepped with alcohol, Betadine, and injected with a 27 gauge needle with traction on the thumb directly into the carpometacarpal joint.  A total volume of 1.5 mL was injected, distending the CMC joint.  Attention was then directed to the right hand, which was exsanguinated with an Esmarch bandage and  an arterial tourniquet on the proximal right brachium inflated to 220 mmHg.  Procedure commenced with oblique incisions directly over the A1 pulleys of the index, ring, and small fingers.  Subcutaneous tissues were carefully divided taking care to release the palmar fascia.  The pulleys were isolated with scissors dissection and use of a Freer followed by release of the A1 pulleys of the index, ring, and small fingers followed by delivery of the flexor tendons and tenosynovectomy of the index and ring fingers.  There was swelling of the superficialis tendon of the index and ring fingers. There was no residual triggering.  Thereafter, full active range of  motion of the fingers in flexion/extension was recovered.  The wounds were inspected for bleeding points followed by repair with interrupted suture of 5-0 nylon.  For aftercare, [name] is provided with a prescription for Vicodin 5 mg one p.o. q.4-6 hours p.r.n. pain, #20 tablets without refill.     Katy Fitch Dajae Kizer, M.D.     RVS/MEDQ  D:  08/18/2010  T:  08/18/2010  Job:  [id number]  Electronically Signed by Josephine Igo M.D. on 08/18/2010 12:46:34 PM

## 2010-08-19 LAB — POCT HEMOGLOBIN-HEMACUE: Hemoglobin: 13.4 g/dL (ref 12.0–15.0)

## 2010-10-06 ENCOUNTER — Other Ambulatory Visit: Payer: Self-pay | Admitting: Family Medicine

## 2010-10-06 DIAGNOSIS — E782 Mixed hyperlipidemia: Secondary | ICD-10-CM

## 2010-10-11 ENCOUNTER — Other Ambulatory Visit (INDEPENDENT_AMBULATORY_CARE_PROVIDER_SITE_OTHER): Payer: Medicare Other

## 2010-10-11 DIAGNOSIS — E782 Mixed hyperlipidemia: Secondary | ICD-10-CM

## 2010-10-11 LAB — LIPID PANEL
Cholesterol: 205 mg/dL — ABNORMAL HIGH (ref 0–200)
HDL: 71 mg/dL (ref >39.00)
VLDL: 31 mg/dL (ref 0.0–40.0)

## 2010-10-11 LAB — HEPATIC FUNCTION PANEL
ALT: 14 U/L (ref 0–35)
Albumin: 3.9 g/dL (ref 3.5–5.2)
Alkaline Phosphatase: 56 U/L (ref 39–117)
Bilirubin, Direct: 0 mg/dL (ref 0.0–0.3)
Total Protein: 7.2 g/dL (ref 6.0–8.3)

## 2010-10-18 ENCOUNTER — Encounter: Payer: Self-pay | Admitting: Family Medicine

## 2010-10-18 ENCOUNTER — Ambulatory Visit (INDEPENDENT_AMBULATORY_CARE_PROVIDER_SITE_OTHER): Payer: Medicare Other | Admitting: Family Medicine

## 2010-10-18 DIAGNOSIS — E782 Mixed hyperlipidemia: Secondary | ICD-10-CM

## 2010-10-18 DIAGNOSIS — I10 Essential (primary) hypertension: Secondary | ICD-10-CM

## 2010-10-18 NOTE — Assessment & Plan Note (Signed)
Good results. Always want LDL lower so be careful with fatty foods. (She and her husband are indeed very careful as he has had an MI.) LDL acceptable at present for her. Lab Results  Component Value Date   CHOL 205* 10/11/2010   HDL 71.00 10/11/2010   LDLCALC 96 06/18/2008   LDLDIRECT 114.9 10/11/2010   TRIG 155.0* 10/11/2010   CHOLHDL 3 10/11/2010

## 2010-10-18 NOTE — Assessment & Plan Note (Signed)
Good control. Cont curr meds. BP: 126/70 mmHg

## 2010-10-18 NOTE — Patient Instructions (Signed)
Refer pt to Dr Reece Agar for Comp Exam in July with labs prior.

## 2010-10-18 NOTE — Progress Notes (Signed)
  Subjective:    Patient ID: Molly [name], female    DOB: 08-Nov-1929, 77 y.o.   MRN: [id number]  HPI Pt here in three month follow up again for having increased her Simva and rechecking lipids thereafter. She is taking Co Q 10 100mg  a day. She also takes Fish Oil.  She feels well and has no complaints. She is tolerating the medications without difficulty.     Review of Systems  Constitutional: Negative for fever, chills, diaphoresis, activity change and fatigue.  HENT: Negative for ear pain, congestion, rhinorrhea and postnasal drip.   Eyes: Negative for redness.  Respiratory: Negative for cough, chest tightness, shortness of breath and wheezing.   Cardiovascular: Negative for chest pain.       Objective:   Physical Exam  Constitutional: She appears well-developed and well-nourished. No distress.  HENT:  Head: Normocephalic and atraumatic.  Right Ear: External ear normal.  Left Ear: External ear normal.  Nose: Nose normal.  Mouth/Throat: Oropharynx is clear and moist. No oropharyngeal exudate.  Eyes: Conjunctivae and EOM are normal. Pupils are equal, round, and reactive to light.  Neck: Normal range of motion. Neck supple. No thyromegaly present.  Cardiovascular: Normal rate, regular rhythm and normal heart sounds.   Pulmonary/Chest: Effort normal and breath sounds normal. She has no wheezes. She has no rales.  Lymphadenopathy:    She has no cervical adenopathy.  Skin: She is not diaphoretic.          Assessment & Plan:

## 2011-05-22 ENCOUNTER — Other Ambulatory Visit: Payer: Self-pay | Admitting: Family Medicine

## 2011-05-30 ENCOUNTER — Other Ambulatory Visit: Payer: Self-pay | Admitting: Family Medicine

## 2011-07-10 ENCOUNTER — Other Ambulatory Visit: Payer: Self-pay | Admitting: Family Medicine

## 2011-07-10 DIAGNOSIS — E7800 Pure hypercholesterolemia, unspecified: Secondary | ICD-10-CM

## 2011-07-10 DIAGNOSIS — M8580 Other specified disorders of bone density and structure, unspecified site: Secondary | ICD-10-CM

## 2011-07-10 DIAGNOSIS — E039 Hypothyroidism, unspecified: Secondary | ICD-10-CM

## 2011-07-11 ENCOUNTER — Other Ambulatory Visit (INDEPENDENT_AMBULATORY_CARE_PROVIDER_SITE_OTHER): Payer: Medicare Other

## 2011-07-11 DIAGNOSIS — M949 Disorder of cartilage, unspecified: Secondary | ICD-10-CM

## 2011-07-11 DIAGNOSIS — M8580 Other specified disorders of bone density and structure, unspecified site: Secondary | ICD-10-CM

## 2011-07-11 DIAGNOSIS — E7800 Pure hypercholesterolemia, unspecified: Secondary | ICD-10-CM

## 2011-07-11 DIAGNOSIS — E039 Hypothyroidism, unspecified: Secondary | ICD-10-CM

## 2011-07-11 LAB — LIPID PANEL
Total CHOL/HDL Ratio: 5
VLDL: 55.6 mg/dL — ABNORMAL HIGH (ref 0.0–40.0)

## 2011-07-11 LAB — COMPREHENSIVE METABOLIC PANEL
ALT: 15 U/L (ref 0–35)
AST: 21 U/L (ref 0–37)
Albumin: 3.7 g/dL (ref 3.5–5.2)
CO2: 28 mEq/L (ref 19–32)
Calcium: 9.2 mg/dL (ref 8.4–10.5)
Chloride: 103 mEq/L (ref 96–112)
GFR: 77.88 mL/min (ref >60.00)
Potassium: 4 mEq/L (ref 3.5–5.1)
Total Protein: 7.2 g/dL (ref 6.0–8.3)

## 2011-07-12 LAB — VITAMIN D 25 HYDROXY (VIT D DEFICIENCY, FRACTURES): Vit D, 25-Hydroxy: 41 ng/mL (ref 30–89)

## 2011-07-13 ENCOUNTER — Other Ambulatory Visit: Payer: Self-pay | Admitting: *Deleted

## 2011-07-13 MED ORDER — LEVOTHYROXINE SODIUM 88 MCG PO TABS: 88.0000 ug | ORAL_TABLET | Freq: Every day | ORAL | Status: DC

## 2011-07-13 NOTE — Telephone Encounter (Signed)
Ok to fill? Pt appt on 07/17/11

## 2011-07-13 NOTE — Telephone Encounter (Signed)
Sent!

## 2011-07-17 ENCOUNTER — Ambulatory Visit (INDEPENDENT_AMBULATORY_CARE_PROVIDER_SITE_OTHER): Payer: Medicare Other | Admitting: Family Medicine

## 2011-07-17 ENCOUNTER — Encounter: Payer: Self-pay | Admitting: Family Medicine

## 2011-07-17 VITALS — BP 166/76 | HR 71 | Temp 98.8°F | Wt 157.0 lb

## 2011-07-17 DIAGNOSIS — E039 Hypothyroidism, unspecified: Secondary | ICD-10-CM

## 2011-07-17 DIAGNOSIS — Z79890 Hormone replacement therapy (postmenopausal): Secondary | ICD-10-CM

## 2011-07-17 DIAGNOSIS — M25519 Pain in unspecified shoulder: Secondary | ICD-10-CM

## 2011-07-17 DIAGNOSIS — I10 Essential (primary) hypertension: Secondary | ICD-10-CM

## 2011-07-17 DIAGNOSIS — E782 Mixed hyperlipidemia: Secondary | ICD-10-CM

## 2011-07-17 DIAGNOSIS — M549 Dorsalgia, unspecified: Secondary | ICD-10-CM

## 2011-07-17 MED ORDER — HYDROCHLOROTHIAZIDE 12.5 MG PO TABS: 12.5000 mg | ORAL_TABLET | Freq: Every day | ORAL | Status: DC

## 2011-07-17 MED ORDER — PRAVASTATIN SODIUM 20 MG PO TABS: 20.0000 mg | ORAL_TABLET | Freq: Every day | ORAL | Status: DC

## 2011-07-17 NOTE — Patient Instructions (Addendum)
If your shoulder isn't getting better, let me know and well set you up with ortho.  Use a heating back on your back.  Once the back pain is better, try taking pravastatin.  If you can tolerate it, notify us and we'll set up labs.  If you can't tolerate it due to aches, stop it and notify us.   Schedule a physical next summer.

## 2011-07-17 NOTE — Progress Notes (Signed)
Elevated Cholesterol: Using medications without problems: off simvastatin due to aches Diet compliance: weight stable and she is diet conscious.   Exercise: "probably not enough".  Discussed.    Hypertension:    Using medication without problems or lightheadedness: yes Chest pain with exertion:no Edema:no Short of breath:no Average home BPs:not checked.   Hypothyroid.  TSH wnl.  She is slightly more fatigued recently.  No neck mass.  No pain in neck.   HRT d/w pt. S/P TAH for endometrial cancer.  Still having hot flashes.  She is aware of risks and benefits.  If she has less sweats, we can continue taper.   R sided back pain without abd pain or leg sx. No L sided or midline sx.  No dysuria.   Fell on R shoulder months ago. Pain on R side laying down. Asking about options.    PMH and SH reviewed  Meds, vitals, and allergies reviewed.   ROS: See HPI.  Otherwise negative.    GEN: nad, alert and oriented HEENT: mucous membranes moist NECK: supple w/o LA CV: rrr. PULM: ctab, no inc wob ABD: soft, +bs EXT: no edema SKIN: no acute rash R shoulder with faint clicking with ROM.  GH joint feels stable.  No cuff pain on testing for int/ext rotation or impingement and no arm drop.  Normal inspection and no AC pain on testing.  Back w/o midline pain but r sided muscle tightness exacerbated by forward flexion w/o radicular component.  No CVA pain.

## 2011-07-18 ENCOUNTER — Ambulatory Visit: Payer: Medicare Other | Admitting: Family Medicine

## 2011-07-18 DIAGNOSIS — Z79890 Hormone replacement therapy (postmenopausal): Secondary | ICD-10-CM | POA: Insufficient documentation

## 2011-07-18 DIAGNOSIS — M549 Dorsalgia, unspecified: Secondary | ICD-10-CM | POA: Insufficient documentation

## 2011-07-18 DIAGNOSIS — M25519 Pain in unspecified shoulder: Secondary | ICD-10-CM | POA: Insufficient documentation

## 2011-07-18 NOTE — Assessment & Plan Note (Signed)
Benign exam with w/o arm drop.  I would follow this for now.

## 2011-07-18 NOTE — Assessment & Plan Note (Signed)
Likely muscle strain, would use heat and this should resolve.  F/u prn.

## 2011-07-18 NOTE — Assessment & Plan Note (Signed)
TSH wnl, continue current meds.  No ADE.

## 2011-07-18 NOTE — Assessment & Plan Note (Signed)
Still having hot flashes. She is aware of risks and benefits. If she has less sweats, we can continue taper.

## 2011-07-18 NOTE — Assessment & Plan Note (Signed)
Recheck 150/70, I don't want to induce hypotension, continue as is.

## 2011-07-18 NOTE — Assessment & Plan Note (Signed)
She'll try pravastatin after her back is improved.  See instructions.  Needs to continue to work on diet/exercise.

## 2011-07-24 ENCOUNTER — Ambulatory Visit: Payer: Medicare Other | Admitting: Family Medicine

## 2011-08-16 ENCOUNTER — Encounter: Payer: Self-pay | Admitting: Family Medicine

## 2011-08-16 ENCOUNTER — Ambulatory Visit (INDEPENDENT_AMBULATORY_CARE_PROVIDER_SITE_OTHER): Payer: Medicare Other | Admitting: Family Medicine

## 2011-08-16 VITALS — BP 140/68 | HR 60 | Temp 98.3°F | Wt 155.8 lb

## 2011-08-16 DIAGNOSIS — R1031 Right lower quadrant pain: Secondary | ICD-10-CM

## 2011-08-16 DIAGNOSIS — M549 Dorsalgia, unspecified: Secondary | ICD-10-CM

## 2011-08-16 LAB — CBC WITH DIFFERENTIAL/PLATELET
Basophils Absolute: 0 10*3/uL (ref 0.0–0.1)
Eosinophils Absolute: 0.1 10*3/uL (ref 0.0–0.7)
Eosinophils Relative: 1.1 % (ref 0.0–5.0)
HCT: 41.2 % (ref 36.0–46.0)
Lymphs Abs: 2.4 10*3/uL (ref 0.7–4.0)
MCHC: 32.8 g/dL (ref 30.0–36.0)
MCV: 95.1 fl (ref 78.0–100.0)
Monocytes Absolute: 0.5 10*3/uL (ref 0.1–1.0)
Neutrophils Relative %: 53 % (ref 43.0–77.0)
Platelets: 282 10*3/uL (ref 150.0–400.0)
RDW: 13.8 % (ref 11.5–14.6)

## 2011-08-16 NOTE — Progress Notes (Signed)
R sided back pain (same as prev, continues) without anterior abd midline pain or leg sx. No L sided sx.  No dysuria.  No diarrhea, no blood in stool. Known hemorrhoids.  No vomiting.  Prev with colonoscopy done, Results: Hemorrhoids and Diverticulosis.  No clear trigger for the sx in the back. No rash.  Heating pad helps only temporarily and minimally.  No fevers.  No weight loss.  She does have some pain with movement, when she gets up, better with external compression of the abdomen.    She hasn't started the pravastatin yet as the aches above haven't resolved.    Meds, vitals, and allergies reviewed.   ROS: See HPI.  Otherwise, noncontributory.  nad ncat rrr ctab abd soft, not ttp except in RLQ, palpation in RLQ causes pain along the R midaxillary line and around to the back but not to the midline anteriorly or posteriorly.  No rebound, no peritoneal signs.  Normal BS Ext w/o edema Skin w/o rash.

## 2011-08-16 NOTE — Patient Instructions (Addendum)
Go to the lab on the way out.  We'll contact you with your lab report. See [place] about your referral before you leave today. Take care.   

## 2011-08-17 NOTE — Assessment & Plan Note (Signed)
Vs abd wall pain.  Ongoing. I suspect oblique source but will check CT given the ongoing sx to r/o intraabdominal pathology, though I doubt that to be the cause.  D/w pt.  Prev note and CBC reviewed.  Await CT.

## 2011-08-18 ENCOUNTER — Ambulatory Visit (INDEPENDENT_AMBULATORY_CARE_PROVIDER_SITE_OTHER)
Admission: RE | Admit: 2011-08-18 | Discharge: 2011-08-18 | Disposition: A | Discharge status: Home / Self Care | Payer: Medicare Other | Source: Ambulatory Visit | Attending: Family Medicine | Admitting: Family Medicine

## 2011-08-18 ENCOUNTER — Other Ambulatory Visit: Payer: Self-pay | Admitting: Family Medicine

## 2011-08-18 ENCOUNTER — Encounter: Payer: Self-pay | Admitting: Family Medicine

## 2011-08-18 DIAGNOSIS — R1031 Right lower quadrant pain: Secondary | ICD-10-CM

## 2011-08-18 DIAGNOSIS — K5790 Diverticulosis of intestine, part unspecified, without perforation or abscess without bleeding: Secondary | ICD-10-CM | POA: Insufficient documentation

## 2011-08-18 DIAGNOSIS — R932 Abnormal findings on diagnostic imaging of liver and biliary tract: Secondary | ICD-10-CM

## 2011-08-18 MED ORDER — IOHEXOL 300 MG/ML  SOLN
100.0000 mL | Freq: Once | INTRAMUSCULAR | Status: AC | PRN
  Administered 2011-08-18: 100 mL via INTRAVENOUS

## 2011-08-21 ENCOUNTER — Other Ambulatory Visit: Payer: Self-pay | Admitting: Family Medicine

## 2011-08-21 DIAGNOSIS — R932 Abnormal findings on diagnostic imaging of liver and biliary tract: Secondary | ICD-10-CM

## 2011-08-22 ENCOUNTER — Ambulatory Visit
Admission: RE | Admit: 2011-08-22 | Discharge: 2011-08-22 | Disposition: A | Discharge status: Home / Self Care | Payer: Medicare Other | Source: Ambulatory Visit | Attending: Family Medicine | Admitting: Family Medicine

## 2011-08-22 DIAGNOSIS — R932 Abnormal findings on diagnostic imaging of liver and biliary tract: Secondary | ICD-10-CM

## 2011-08-22 MED ORDER — GADOBENATE DIMEGLUMINE 529 MG/ML IV SOLN
14.0000 mL | Freq: Once | INTRAVENOUS | Status: AC | PRN
  Administered 2011-08-22: 14 mL via INTRAVENOUS

## 2011-08-26 ENCOUNTER — Other Ambulatory Visit: Payer: Self-pay | Admitting: Family Medicine

## 2011-10-16 ENCOUNTER — Encounter: Payer: Self-pay | Admitting: Gastroenterology

## 2011-10-25 ENCOUNTER — Ambulatory Visit (INDEPENDENT_AMBULATORY_CARE_PROVIDER_SITE_OTHER): Payer: Medicare Other | Admitting: Family Medicine

## 2011-10-25 ENCOUNTER — Encounter: Payer: Self-pay | Admitting: Family Medicine

## 2011-10-25 VITALS — BP 126/60 | HR 63 | Temp 98.4°F | Wt 155.0 lb

## 2011-10-25 DIAGNOSIS — E039 Hypothyroidism, unspecified: Secondary | ICD-10-CM

## 2011-10-25 DIAGNOSIS — Z79890 Hormone replacement therapy (postmenopausal): Secondary | ICD-10-CM

## 2011-10-25 DIAGNOSIS — M542 Cervicalgia: Secondary | ICD-10-CM

## 2011-10-25 DIAGNOSIS — R109 Unspecified abdominal pain: Secondary | ICD-10-CM

## 2011-10-25 NOTE — Progress Notes (Signed)
"  It's not like a sore throat, but it feels like something hit me in the adam's apple."  No trauma.  Going on for about 1 week.  She was started on xalatan in August 2013.  No FCNAVD.  She feels well o/w.  No dysphagia.  Not SOB.  She notices it more when upright.  No wheeze.  Known hypothyroidism, on replacement, since soon after pregnancy/delivery.  No heartburn.    HRT.  D/w pt about cessation.  She had frequent UTIs when she would cut the dose of HRT prev.  She would be open to talking to GYN about options.    Abd pain. Prev imaged and no sig result noted.  Still with occ abd pain, worse at the end of the day.    Meds, vitals, and allergies reviewed.   ROS: See HPI.  Otherwise, noncontributory.  nad ncat Tm wnl Nasal and OP exam wnl Neck supple, no focal mass or LA rrr ctab

## 2011-10-25 NOTE — Assessment & Plan Note (Signed)
With benign exam but h/o hypothyroidisn.  Would check tsh and u/s given hx.  She agrees.  Nontoxic.  No stridor, no dysphagia.

## 2011-10-25 NOTE — Assessment & Plan Note (Signed)
She'll f/u with GI, due for colonoscopy, and will f/u here prn.  This could be abd wall sx.

## 2011-10-25 NOTE — Assessment & Plan Note (Signed)
She'll discuss with gyn clinic.

## 2011-10-25 NOTE — Patient Instructions (Addendum)
Go to the lab on the way out.  We'll contact you with your lab report. See [place] about your referral before you leave today. Take care.   

## 2011-10-26 ENCOUNTER — Ambulatory Visit
Admission: RE | Admit: 2011-10-26 | Discharge: 2011-10-26 | Disposition: A | Discharge status: Home / Self Care | Payer: Medicare Other | Source: Ambulatory Visit | Attending: Family Medicine | Admitting: Family Medicine

## 2011-10-26 DIAGNOSIS — M542 Cervicalgia: Secondary | ICD-10-CM

## 2011-10-26 LAB — TSH: TSH: 0.73 u[IU]/mL (ref 0.35–5.50)

## 2012-04-26 ENCOUNTER — Encounter: Payer: Self-pay | Admitting: Gastroenterology

## 2012-05-23 ENCOUNTER — Other Ambulatory Visit: Payer: Self-pay | Admitting: Family Medicine

## 2012-07-04 ENCOUNTER — Other Ambulatory Visit: Payer: Self-pay | Admitting: Family Medicine

## 2012-08-17 ENCOUNTER — Other Ambulatory Visit: Payer: Self-pay | Admitting: Family Medicine

## 2012-08-22 ENCOUNTER — Other Ambulatory Visit: Payer: Self-pay | Admitting: Family Medicine

## 2012-08-24 ENCOUNTER — Other Ambulatory Visit: Payer: Self-pay | Admitting: Family Medicine

## 2012-10-25 ENCOUNTER — Other Ambulatory Visit: Payer: Self-pay | Admitting: Family Medicine

## 2012-10-25 DIAGNOSIS — E039 Hypothyroidism, unspecified: Secondary | ICD-10-CM

## 2012-10-25 DIAGNOSIS — E782 Mixed hyperlipidemia: Secondary | ICD-10-CM

## 2012-10-29 ENCOUNTER — Other Ambulatory Visit (INDEPENDENT_AMBULATORY_CARE_PROVIDER_SITE_OTHER): Payer: Medicare Other

## 2012-10-29 DIAGNOSIS — E782 Mixed hyperlipidemia: Secondary | ICD-10-CM

## 2012-10-29 DIAGNOSIS — E039 Hypothyroidism, unspecified: Secondary | ICD-10-CM

## 2012-10-29 LAB — LIPID PANEL
HDL: 63.6 mg/dL (ref >39.00)
Total CHOL/HDL Ratio: 5
Triglycerides: 193 mg/dL — ABNORMAL HIGH (ref 0.0–149.0)
VLDL: 38.6 mg/dL (ref 0.0–40.0)

## 2012-10-29 LAB — TSH: TSH: 0.74 u[IU]/mL (ref 0.35–5.50)

## 2012-10-29 LAB — COMPREHENSIVE METABOLIC PANEL
ALT: 18 U/L (ref 0–35)
AST: 20 U/L (ref 0–37)
Alkaline Phosphatase: 56 U/L (ref 39–117)
Creatinine, Ser: 0.7 mg/dL (ref 0.4–1.2)
Sodium: 141 mEq/L (ref 135–145)
Total Bilirubin: 0.7 mg/dL (ref 0.3–1.2)
Total Protein: 7.2 g/dL (ref 6.0–8.3)

## 2012-10-29 LAB — LDL CHOLESTEROL, DIRECT: Direct LDL: 197 mg/dL

## 2012-11-06 ENCOUNTER — Ambulatory Visit (INDEPENDENT_AMBULATORY_CARE_PROVIDER_SITE_OTHER): Payer: Medicare Other | Admitting: Family Medicine

## 2012-11-06 ENCOUNTER — Encounter: Payer: Self-pay | Admitting: Family Medicine

## 2012-11-06 VITALS — BP 120/80 | HR 55 | Temp 98.5°F | Ht 63.0 in | Wt 155.0 lb

## 2012-11-06 DIAGNOSIS — I10 Essential (primary) hypertension: Secondary | ICD-10-CM

## 2012-11-06 DIAGNOSIS — Z0000 Encounter for general adult medical examination without abnormal findings: Secondary | ICD-10-CM

## 2012-11-06 DIAGNOSIS — E039 Hypothyroidism, unspecified: Secondary | ICD-10-CM

## 2012-11-06 DIAGNOSIS — H409 Unspecified glaucoma: Secondary | ICD-10-CM

## 2012-11-06 DIAGNOSIS — R7301 Impaired fasting glucose: Secondary | ICD-10-CM

## 2012-11-06 DIAGNOSIS — E782 Mixed hyperlipidemia: Secondary | ICD-10-CM

## 2012-11-06 DIAGNOSIS — Z780 Asymptomatic menopausal state: Secondary | ICD-10-CM

## 2012-11-06 DIAGNOSIS — M8580 Other specified disorders of bone density and structure, unspecified site: Secondary | ICD-10-CM

## 2012-11-06 NOTE — Patient Instructions (Signed)
Take care, don't change your meds and call me if needed.  Glad to see you.

## 2012-11-07 ENCOUNTER — Encounter: Payer: Self-pay | Admitting: Family Medicine

## 2012-11-07 DIAGNOSIS — H409 Unspecified glaucoma: Secondary | ICD-10-CM | POA: Insufficient documentation

## 2012-11-07 DIAGNOSIS — Z0000 Encounter for general adult medical examination without abnormal findings: Secondary | ICD-10-CM | POA: Insufficient documentation

## 2012-11-07 NOTE — Assessment & Plan Note (Signed)
Controlled, continue current meds.   

## 2012-11-07 NOTE — Assessment & Plan Note (Signed)
See scanned forms.  Routine anticipatory guidance given to patient.  See health maintenance. Flu 2014 Shingles 2009 PNA 2000 Tetanus 2009 Colonoscopy declined due to age.  Mammogram pending.  DXA pending.  Advance directive dw pt.  Husband designated if incapacitated.  Cognitive function addressed- see scanned forms- and if abnormal then additional documentation follows.

## 2012-11-07 NOTE — Progress Notes (Signed)
I have personally reviewed the Medicare Annual Wellness questionnaire and have noted 1. The patient's medical and social history 2. Their use of alcohol, tobacco or illicit drugs 3. Their current medications and supplements 4. The patient's functional ability including ADL's, fall risks, home safety risks and hearing or visual             impairment. 5. Diet and physical activities 6. Evidence for depression or mood disorders  The patients weight, height, BMI have been recorded in the chart and visual acuity is per eye clinic.  I have made referrals, counseling and provided education to the patient based review of the above and I have provided the pt with a written personalized care plan for preventive services.  See scanned forms.  Routine anticipatory guidance given to patient.  See health maintenance. Flu 2014 Shingles 2009 PNA 2000 Tetanus 2009 Colonoscopy declined due to age.  Mammogram pending.  DXA pending.  Advance directive dw pt.  Husband designated if incapacitated.  Cognitive function addressed- see scanned forms- and if abnormal then additional documentation follows.   Hypertension:    Using medication without problems or lightheadedness: yes Chest pain with exertion:no Edema:no Short of breath:no  Hypothyroid.  Less energy than prev (though 79 y/o), but no neck mass, lump, pain.  Compliant with meds.    HLD. Healthy diet.  Statin intolerant.  Labs discussed.    PMH and SH reviewed  Meds, vitals, and allergies reviewed.   ROS: See HPI.  Otherwise negative.    GEN: nad, alert and oriented HEENT: mucous membranes moist NECK: supple w/o LA, no TMG CV: rrr. PULM: ctab, no inc wob ABD: soft, +bs EXT: no edema SKIN: no acute rash

## 2012-11-07 NOTE — Assessment & Plan Note (Signed)
Continue healthy diet.  Labs dw pt.

## 2012-11-07 NOTE — Assessment & Plan Note (Signed)
Continue healthy diet.  Labs dw pt.  

## 2012-11-20 ENCOUNTER — Other Ambulatory Visit: Payer: Self-pay | Admitting: Family Medicine

## 2012-12-17 ENCOUNTER — Ambulatory Visit
Admission: RE | Admit: 2012-12-17 | Discharge: 2012-12-17 | Disposition: A | Discharge status: Home / Self Care | Payer: Medicare Other | Source: Ambulatory Visit | Attending: Family Medicine | Admitting: Family Medicine

## 2012-12-17 DIAGNOSIS — M8580 Other specified disorders of bone density and structure, unspecified site: Secondary | ICD-10-CM

## 2012-12-17 DIAGNOSIS — Z780 Asymptomatic menopausal state: Secondary | ICD-10-CM

## 2012-12-18 ENCOUNTER — Encounter: Payer: Self-pay | Admitting: Family Medicine

## 2012-12-26 ENCOUNTER — Other Ambulatory Visit: Payer: Self-pay | Admitting: Family Medicine

## 2013-01-14 ENCOUNTER — Other Ambulatory Visit: Payer: Self-pay | Admitting: *Deleted

## 2013-01-14 MED ORDER — LEVOTHYROXINE SODIUM 88 MCG PO TABS: ORAL_TABLET | ORAL | Status: DC

## 2013-01-14 MED ORDER — HYDROCHLOROTHIAZIDE 12.5 MG PO CAPS: ORAL_CAPSULE | ORAL | Status: DC

## 2013-01-14 MED ORDER — ATENOLOL 25 MG PO TABS: ORAL_TABLET | ORAL | Status: DC

## 2013-10-01 ENCOUNTER — Other Ambulatory Visit: Payer: Self-pay | Admitting: Family Medicine

## 2013-10-22 ENCOUNTER — Other Ambulatory Visit: Payer: Self-pay | Admitting: Family Medicine

## 2013-10-22 DIAGNOSIS — M949 Disorder of cartilage, unspecified: Secondary | ICD-10-CM

## 2013-10-22 DIAGNOSIS — M899 Disorder of bone, unspecified: Secondary | ICD-10-CM

## 2013-10-22 DIAGNOSIS — E039 Hypothyroidism, unspecified: Secondary | ICD-10-CM

## 2013-10-22 DIAGNOSIS — I10 Essential (primary) hypertension: Secondary | ICD-10-CM

## 2013-10-27 ENCOUNTER — Other Ambulatory Visit (INDEPENDENT_AMBULATORY_CARE_PROVIDER_SITE_OTHER): Payer: Medicare PPO

## 2013-10-27 DIAGNOSIS — I10 Essential (primary) hypertension: Secondary | ICD-10-CM

## 2013-10-27 DIAGNOSIS — M899 Disorder of bone, unspecified: Secondary | ICD-10-CM

## 2013-10-27 DIAGNOSIS — M949 Disorder of cartilage, unspecified: Secondary | ICD-10-CM

## 2013-10-27 DIAGNOSIS — R7301 Impaired fasting glucose: Secondary | ICD-10-CM

## 2013-10-27 DIAGNOSIS — E039 Hypothyroidism, unspecified: Secondary | ICD-10-CM

## 2013-10-27 DIAGNOSIS — E782 Mixed hyperlipidemia: Secondary | ICD-10-CM

## 2013-10-27 LAB — COMPREHENSIVE METABOLIC PANEL
ALT: 12 U/L (ref 0–35)
AST: 21 U/L (ref 0–37)
Albumin: 3.2 g/dL — ABNORMAL LOW (ref 3.5–5.2)
Alkaline Phosphatase: 67 U/L (ref 39–117)
BUN: 13 mg/dL (ref 6–23)
CHLORIDE: 102 meq/L (ref 96–112)
CO2: 32 meq/L (ref 19–32)
Calcium: 9.4 mg/dL (ref 8.4–10.5)
Creatinine, Ser: 0.8 mg/dL (ref 0.4–1.2)
GFR: 75.14 mL/min (ref >60.00)
Glucose, Bld: 109 mg/dL — ABNORMAL HIGH (ref 70–99)
Potassium: 3.7 mEq/L (ref 3.5–5.1)
SODIUM: 139 meq/L (ref 135–145)
TOTAL PROTEIN: 7.6 g/dL (ref 6.0–8.3)
Total Bilirubin: 0.6 mg/dL (ref 0.2–1.2)

## 2013-10-27 LAB — LIPID PANEL
Cholesterol: 286 mg/dL — ABNORMAL HIGH (ref 0–200)
HDL: 54.5 mg/dL (ref >39.00)
NONHDL: 231.5
Total CHOL/HDL Ratio: 5
Triglycerides: 220 mg/dL — ABNORMAL HIGH (ref 0.0–149.0)
VLDL: 44 mg/dL — AB (ref 0.0–40.0)

## 2013-10-27 LAB — LDL CHOLESTEROL, DIRECT: Direct LDL: 195.5 mg/dL

## 2013-10-27 LAB — TSH: TSH: 0.76 u[IU]/mL (ref 0.35–4.50)

## 2013-10-27 LAB — VITAMIN D 25 HYDROXY (VIT D DEFICIENCY, FRACTURES): VITD: 28.94 ng/mL — AB (ref 30.00–100.00)

## 2013-10-31 ENCOUNTER — Other Ambulatory Visit: Payer: Self-pay | Admitting: Dermatology

## 2013-11-03 ENCOUNTER — Ambulatory Visit: Payer: Medicare PPO

## 2013-11-03 ENCOUNTER — Ambulatory Visit (INDEPENDENT_AMBULATORY_CARE_PROVIDER_SITE_OTHER): Payer: Medicare PPO | Admitting: *Deleted

## 2013-11-03 DIAGNOSIS — Z23 Encounter for immunization: Secondary | ICD-10-CM

## 2013-11-15 ENCOUNTER — Other Ambulatory Visit: Payer: Self-pay | Admitting: Family Medicine

## 2013-11-21 ENCOUNTER — Ambulatory Visit (INDEPENDENT_AMBULATORY_CARE_PROVIDER_SITE_OTHER): Payer: Medicare PPO | Admitting: Family Medicine

## 2013-11-21 ENCOUNTER — Encounter: Payer: Self-pay | Admitting: Family Medicine

## 2013-11-21 VITALS — BP 126/76 | HR 60 | Temp 98.1°F | Ht 62.75 in | Wt 153.8 lb

## 2013-11-21 DIAGNOSIS — Z0000 Encounter for general adult medical examination without abnormal findings: Secondary | ICD-10-CM

## 2013-11-21 DIAGNOSIS — IMO0002 Reserved for concepts with insufficient information to code with codable children: Secondary | ICD-10-CM

## 2013-11-21 DIAGNOSIS — Z7189 Other specified counseling: Secondary | ICD-10-CM

## 2013-11-21 DIAGNOSIS — R7301 Impaired fasting glucose: Secondary | ICD-10-CM

## 2013-11-21 DIAGNOSIS — N8110 Cystocele, unspecified: Secondary | ICD-10-CM

## 2013-11-21 DIAGNOSIS — I10 Essential (primary) hypertension: Secondary | ICD-10-CM

## 2013-11-21 DIAGNOSIS — E039 Hypothyroidism, unspecified: Secondary | ICD-10-CM

## 2013-11-21 NOTE — Progress Notes (Signed)
Pre visit review using our clinic review tool, if applicable. No additional management support is needed unless otherwise documented below in the visit note.  I have personally reviewed the Medicare Annual Wellness questionnaire and have noted 1. The patient's medical and social history 2. Their use of alcohol, tobacco or illicit drugs 3. Their current medications and supplements 4. The patient's functional ability including ADL's, fall risks, home safety risks and hearing or visual             impairment. 5. Diet and physical activities 6. Evidence for depression or mood disorders  The patients weight, height, BMI have been recorded in the chart and visual acuity is per eye clinic.  I have made referrals, counseling and provided education to the patient based review of the above and I have provided the pt with a written personalized care plan for preventive services.  Provider list updated- see scanned forms.  Routine anticipatory guidance given to patient.  See health maintenance.  Flu 2015 Shingles 2009 PNA 2000 Tetanus 2009 Colon CA screening not due given her age, d/w pt.  She agrees.  Breast cancer screening 2014 Advance directive- husband designated if patient were incapacitated.  Cognitive function addressed- see scanned forms- and if abnormal then additional documentation follows.  DXA 2014  Hyperglycemia, mild, stable, d/w pt re: diet.  No meds at this point.   Hypertension:    Using medication without problems or lightheadedness: yes Chest pain with exertion:no Edema:no Short of breath:no Statin intolerant.  D/w pt about labs.    Hypothyroid.  TSH wnl.  No neck mass. No dysphagia.  Feels well.   Labs d/w pt.   Urinary frequency and stress incontinence. Asking about options.  "It feels like my bladder dropped."  Not burning with urination.  Wearing a pad daily.  Not doing kegel exercises. She has noted some vaginal dryness with intercourse.   PMH and SH  reviewed  Meds, vitals, and allergies reviewed.   ROS: See HPI.  Otherwise negative.    GEN: nad, alert and oriented HEENT: mucous membranes moist NECK: supple w/o LA CV: rrr. PULM: ctab, no inc wob ABD: soft, +bs EXT: no edema SKIN: no acute rash Breast exam: No mass, nodules, thickening, tenderness, bulging, retraction, inflamation, nipple discharge or skin changes noted.  No axillary or clavicular LA.  Chaperoned exam.  Normal introitus for age, no external lesions, no vaginal discharge, mucosa pink and moist, no vaginal lesions, cystocele noted.  Chaperoned exam.

## 2013-11-21 NOTE — Patient Instructions (Signed)
Call about a mammogram.  Try plain/regular KY gel.  Test a patch on your forearm first and see if you can tolerate it.  Let us know when you need refills after the first of the year.   Take care.  Glad to see you.

## 2013-11-24 ENCOUNTER — Other Ambulatory Visit: Payer: Self-pay

## 2013-11-24 DIAGNOSIS — IMO0002 Reserved for concepts with insufficient information to code with codable children: Secondary | ICD-10-CM | POA: Insufficient documentation

## 2013-11-24 DIAGNOSIS — Z7189 Other specified counseling: Secondary | ICD-10-CM | POA: Insufficient documentation

## 2013-11-24 DIAGNOSIS — Z1231 Encounter for screening mammogram for malignant neoplasm of breast: Secondary | ICD-10-CM

## 2013-11-24 NOTE — Assessment & Plan Note (Signed)
Controlled, continue as is, labs d/w pt. No change in meds.

## 2013-11-24 NOTE — Assessment & Plan Note (Signed)
Mild, continue healthy diet and exercise.

## 2013-11-24 NOTE — Assessment & Plan Note (Signed)
She can try kegel exercises and prn KY with intercourse.  If not improved, we can refer over to uro.  She agrees, she'll update me as needed.

## 2013-11-24 NOTE — Assessment & Plan Note (Signed)
Controlled, continue as is, labs d/w pt. No change in meds. Statin intolerant.

## 2013-11-24 NOTE — Assessment & Plan Note (Signed)
Flu 2015 Shingles 2009 PNA 2000 Tetanus 2009 Colon CA screening not due given her age, d/w pt.  She agrees.  Breast cancer screening 2014 Advance directive- husband designated if patient were incapacitated.  Cognitive function addressed- see scanned forms- and if abnormal then additional documentation follows.  DXA 2014

## 2013-12-03 ENCOUNTER — Other Ambulatory Visit: Payer: Self-pay | Admitting: Family Medicine

## 2013-12-19 ENCOUNTER — Ambulatory Visit
Admission: RE | Admit: 2013-12-19 | Discharge: 2013-12-19 | Disposition: A | Discharge status: Home / Self Care | Payer: Medicare PPO | Source: Ambulatory Visit

## 2013-12-19 DIAGNOSIS — Z1231 Encounter for screening mammogram for malignant neoplasm of breast: Secondary | ICD-10-CM

## 2013-12-22 ENCOUNTER — Encounter: Payer: Self-pay | Admitting: *Deleted

## 2014-02-10 ENCOUNTER — Other Ambulatory Visit: Payer: Self-pay

## 2014-02-10 MED ORDER — HYDROCHLOROTHIAZIDE 12.5 MG PO CAPS: 12.5000 mg | ORAL_CAPSULE | Freq: Every day | ORAL | Status: DC

## 2014-02-10 NOTE — Telephone Encounter (Signed)
Pt left v/m requesting refill HCTZ to [facility]. Pt seen annual 11/21/13. Advised done.

## 2014-03-11 ENCOUNTER — Encounter (HOSPITAL_COMMUNITY): Payer: Self-pay | Admitting: Emergency Medicine

## 2014-03-11 ENCOUNTER — Emergency Department (HOSPITAL_COMMUNITY): Payer: PPO

## 2014-03-11 ENCOUNTER — Emergency Department (HOSPITAL_COMMUNITY)
Admission: EM | Admit: 2014-03-11 | Discharge: 2014-03-11 | Disposition: A | Discharge status: Home / Self Care | Payer: PPO | Attending: Emergency Medicine | Admitting: Emergency Medicine

## 2014-03-11 ENCOUNTER — Telehealth: Payer: Self-pay | Admitting: Family Medicine

## 2014-03-11 ENCOUNTER — Ambulatory Visit: Payer: Medicare PPO | Admitting: Family Medicine

## 2014-03-11 DIAGNOSIS — R1031 Right lower quadrant pain: Secondary | ICD-10-CM | POA: Insufficient documentation

## 2014-03-11 DIAGNOSIS — K5900 Constipation, unspecified: Secondary | ICD-10-CM | POA: Insufficient documentation

## 2014-03-11 DIAGNOSIS — E079 Disorder of thyroid, unspecified: Secondary | ICD-10-CM | POA: Insufficient documentation

## 2014-03-11 DIAGNOSIS — I10 Essential (primary) hypertension: Secondary | ICD-10-CM | POA: Insufficient documentation

## 2014-03-11 DIAGNOSIS — Z9049 Acquired absence of other specified parts of digestive tract: Secondary | ICD-10-CM | POA: Insufficient documentation

## 2014-03-11 DIAGNOSIS — Z87442 Personal history of urinary calculi: Secondary | ICD-10-CM | POA: Diagnosis not present

## 2014-03-11 DIAGNOSIS — R109 Unspecified abdominal pain: Secondary | ICD-10-CM

## 2014-03-11 DIAGNOSIS — Z79899 Other long term (current) drug therapy: Secondary | ICD-10-CM | POA: Diagnosis not present

## 2014-03-11 HISTORY — DX: Calculus of kidney: N20.0

## 2014-03-11 LAB — URINALYSIS, ROUTINE W REFLEX MICROSCOPIC
BILIRUBIN URINE: NEGATIVE
GLUCOSE, UA: NEGATIVE mg/dL
Hgb urine dipstick: NEGATIVE
KETONES UR: NEGATIVE mg/dL
NITRITE: NEGATIVE
Protein, ur: NEGATIVE mg/dL
SPECIFIC GRAVITY, URINE: 1.009 (ref 1.005–1.030)
UROBILINOGEN UA: 0.2 mg/dL (ref 0.0–1.0)
pH: 7 (ref 5.0–8.0)

## 2014-03-11 LAB — COMPREHENSIVE METABOLIC PANEL
ALT: 15 U/L (ref 0–35)
ANION GAP: 6 (ref 5–15)
AST: 19 U/L (ref 0–37)
Albumin: 3.6 g/dL (ref 3.5–5.2)
Alkaline Phosphatase: 69 U/L (ref 39–117)
BUN: 12 mg/dL (ref 6–23)
CALCIUM: 9.4 mg/dL (ref 8.4–10.5)
CO2: 31 mmol/L (ref 19–32)
Chloride: 104 mmol/L (ref 96–112)
Creatinine, Ser: 0.69 mg/dL (ref 0.50–1.10)
GFR calc Af Amer: >90 mL/min (ref >90)
GFR, EST NON AFRICAN AMERICAN: 79 mL/min — AB (ref >90)
GLUCOSE: 123 mg/dL — AB (ref 70–99)
POTASSIUM: 3.6 mmol/L (ref 3.5–5.1)
Sodium: 141 mmol/L (ref 135–145)
Total Bilirubin: 0.8 mg/dL (ref 0.3–1.2)
Total Protein: 7.2 g/dL (ref 6.0–8.3)

## 2014-03-11 LAB — CBC WITH DIFFERENTIAL/PLATELET
BASOS PCT: 0 % (ref 0–1)
Basophils Absolute: 0 10*3/uL (ref 0.0–0.1)
Eosinophils Absolute: 0 10*3/uL (ref 0.0–0.7)
Eosinophils Relative: 1 % (ref 0–5)
HCT: 40.2 % (ref 36.0–46.0)
Hemoglobin: 13.1 g/dL (ref 12.0–15.0)
LYMPHS PCT: 25 % (ref 12–46)
Lymphs Abs: 2.2 10*3/uL (ref 0.7–4.0)
MCH: 30.3 pg (ref 26.0–34.0)
MCHC: 32.6 g/dL (ref 30.0–36.0)
MCV: 93.1 fL (ref 78.0–100.0)
Monocytes Absolute: 0.5 10*3/uL (ref 0.1–1.0)
Monocytes Relative: 6 % (ref 3–12)
NEUTROS PCT: 68 % (ref 43–77)
Neutro Abs: 6 10*3/uL (ref 1.7–7.7)
Platelets: 323 10*3/uL (ref 150–400)
RBC: 4.32 MIL/uL (ref 3.87–5.11)
RDW: 13.6 % (ref 11.5–15.5)
WBC: 8.8 10*3/uL (ref 4.0–10.5)

## 2014-03-11 LAB — URINE MICROSCOPIC-ADD ON

## 2014-03-11 LAB — LIPASE, BLOOD: Lipase: 25 U/L (ref 11–59)

## 2014-03-11 MED ORDER — TRAMADOL HCL 50 MG PO TABS: 50.0000 mg | ORAL_TABLET | Freq: Four times a day (QID) | ORAL | Status: DC | PRN

## 2014-03-11 MED ORDER — SODIUM CHLORIDE 0.9 % IV SOLN
INTRAVENOUS | Status: DC
  Administered 2014-03-11: 12:00:00 via INTRAVENOUS

## 2014-03-11 MED ORDER — IOHEXOL 300 MG/ML  SOLN
100.0000 mL | Freq: Once | INTRAMUSCULAR | Status: AC | PRN
  Administered 2014-03-11: 100 mL via INTRAVENOUS

## 2014-03-11 MED ORDER — SODIUM CHLORIDE 0.9 % IV BOLUS (SEPSIS)
250.0000 mL | Freq: Once | INTRAVENOUS | Status: AC
  Administered 2014-03-11: 250 mL via INTRAVENOUS

## 2014-03-11 MED ORDER — ONDANSETRON HCL 4 MG/2ML IJ SOLN
4.0000 mg | Freq: Once | INTRAMUSCULAR | Status: AC
  Administered 2014-03-11: 4 mg via INTRAVENOUS
  Filled 2014-03-11: qty 2

## 2014-03-11 MED ORDER — IOHEXOL 300 MG/ML  SOLN
25.0000 mL | Freq: Once | INTRAMUSCULAR | Status: AC | PRN
  Administered 2014-03-11: 25 mL via ORAL

## 2014-03-11 NOTE — ED Notes (Signed)
Pt reports that she has been having intermittent right side pain x 2 days. Pt reports pain was eased off with aleve.

## 2014-03-11 NOTE — Telephone Encounter (Signed)
Spoke to pt's husband and she is currently in ED awaiting CT scan

## 2014-03-11 NOTE — ED Provider Notes (Signed)
CSN: [id number]     Arrival date & time 03/11/14  1029 History   First MD Initiated Contact with Patient 03/11/14 1031     Chief Complaint  Patient presents with  . Abdominal Pain     (Consider location/radiation/quality/duration/timing/severity/associated sxs/prior Treatment) Patient is a 81 y.o. female presenting with abdominal pain. The history is provided by the patient.  Abdominal Pain Associated symptoms: constipation   Associated symptoms: no chest pain, no diarrhea, no dysuria, no fever, no nausea, no shortness of breath and no vomiting    patient with complaint of right lower quadrant intermittent abdominal pain since beginning of January worse in the past 2 days. No nausea no vomiting no diarrhea no fever. Had some constipation. Pain is about 5 out of 10 ache in nature. Patient's had a history of kidney stones in the past does not remind her of that. Patient's also had her gallbladder removed and had a hysterectomy.  Past Medical History  Diagnosis Date  . Kidney stones   . Hypertension   . Thyroid disease     hypo   Past Surgical History  Procedure Laterality Date  . Cholecystectomy     No family history on file. History  Substance Use Topics  . Smoking status: Never Smoker   . Smokeless tobacco: Not on file  . Alcohol Use: No   OB History    No data available     Review of Systems  Constitutional: Negative for fever.  HENT: Negative for congestion.   Eyes: Negative for redness.  Respiratory: Negative for shortness of breath.   Cardiovascular: Negative for chest pain.  Gastrointestinal: Positive for abdominal pain and constipation. Negative for nausea, vomiting and diarrhea.  Genitourinary: Negative for dysuria.  Musculoskeletal: Negative for back pain.  Skin: Negative for rash.  Neurological: Negative for headaches.  Hematological: Does not bruise/bleed easily.  Psychiatric/Behavioral: Negative for confusion.      Allergies  Macrodantin; Adhesive;  and Morphine and related  Home Medications   Prior to Admission medications   Medication Sig Start Date End Date Taking? Authorizing Provider  atenolol (TENORMIN) 25 MG tablet Take 25 mg by mouth daily.   Yes Historical Provider, MD  brimonidine (ALPHAGAN P) 0.1 % SOLN Apply 1 drop to eye 2 (two) times daily.   Yes Historical Provider, MD  dorzolamide-timolol (COSOPT) 22.3-6.8 MG/ML ophthalmic solution Place 1 drop into both eyes 2 (two) times daily.   Yes Historical Provider, MD  hydrochlorothiazide (HYDRODIURIL) 12.5 MG tablet Take 12.5 mg by mouth daily.   Yes Historical Provider, MD  latanoprost (XALATAN) 0.005 % ophthalmic solution Place 1 drop into both eyes at bedtime.   Yes Historical Provider, MD  levothyroxine (SYNTHROID, LEVOTHROID) 88 MCG tablet Take 88 mcg by mouth daily before breakfast.   Yes Historical Provider, MD  Multiple Vitamin (MULTIVITAMIN) tablet Take 1 tablet by mouth daily.   Yes Historical Provider, MD  Nutritional Supplements (ESTROVEN PO) Take 1 capsule by mouth daily.   Yes Historical Provider, MD  traMADol (ULTRAM) 50 MG tablet Take 1 tablet (50 mg total) by mouth every 6 (six) hours as needed. 03/11/14   Fredia Sorrow, MD   BP 122/55 mmHg  Pulse 57  Temp(Src) 97.7 F (36.5 C) (Oral)  Resp 17  Ht 5\' 4"  (1.626 m)  Wt 153 lb (69.4 kg)  BMI 26.25 kg/m2  SpO2 97% Physical Exam  Constitutional: She is oriented to person, place, and time. She appears well-developed and well-nourished. No distress.  HENT:  Head: Normocephalic and atraumatic.  Mouth/Throat: Oropharynx is clear and moist.  Eyes: EOM are normal. Pupils are equal, round, and reactive to light.  Neck: Normal range of motion.  Cardiovascular: Normal rate and regular rhythm.   Pulmonary/Chest: Effort normal and breath sounds normal. No respiratory distress.  Abdominal: Soft. Bowel sounds are normal. There is tenderness.  Patient with mild tenderness right lower quadrant no guarding.   Musculoskeletal: Normal range of motion. She exhibits no edema.  Neurological: She is alert and oriented to person, place, and time. No cranial nerve deficit. She exhibits normal muscle tone. Coordination normal.  Skin: Skin is warm. No rash noted. No erythema.  Nursing note and vitals reviewed.   ED Course  Procedures (including critical care time) Labs Review Labs Reviewed  COMPREHENSIVE METABOLIC PANEL - Abnormal; Notable for the following:    Glucose, Bld 123 (*)    GFR calc non Af Amer 79 (*)    All other components within normal limits  URINALYSIS, ROUTINE W REFLEX MICROSCOPIC - Abnormal; Notable for the following:    Leukocytes, UA TRACE (*)    All other components within normal limits  LIPASE, BLOOD  URINE MICROSCOPIC-ADD ON  CBC WITH DIFFERENTIAL/PLATELET   Results for orders placed or performed during the hospital encounter of 03/11/14  Comprehensive metabolic panel  Result Value Ref Range   Sodium 141 135 - 145 mmol/L   Potassium 3.6 3.5 - 5.1 mmol/L   Chloride 104 96 - 112 mmol/L   CO2 31 19 - 32 mmol/L   Glucose, Bld 123 (H) 70 - 99 mg/dL   BUN 12 6 - 23 mg/dL   Creatinine, Ser 0.69 0.50 - 1.10 mg/dL   Calcium 9.4 8.4 - 10.5 mg/dL   Total Protein 7.2 6.0 - 8.3 g/dL   Albumin 3.6 3.5 - 5.2 g/dL   AST 19 0 - 37 U/L   ALT 15 0 - 35 U/L   Alkaline Phosphatase 69 39 - 117 U/L   Total Bilirubin 0.8 0.3 - 1.2 mg/dL   GFR calc non Af Amer 79 (L) >90 mL/min   GFR calc Af Amer >90 >90 mL/min   Anion gap 6 5 - 15  Lipase, blood  Result Value Ref Range   Lipase 25 11 - 59 U/L  Urinalysis, Routine w reflex microscopic  Result Value Ref Range   Color, Urine YELLOW YELLOW   APPearance CLEAR CLEAR   Specific Gravity, Urine 1.009 1.005 - 1.030   pH 7.0 5.0 - 8.0   Glucose, UA NEGATIVE NEGATIVE mg/dL   Hgb urine dipstick NEGATIVE NEGATIVE   Bilirubin Urine NEGATIVE NEGATIVE   Ketones, ur NEGATIVE NEGATIVE mg/dL   Protein, ur NEGATIVE NEGATIVE mg/dL   Urobilinogen,  UA 0.2 0.0 - 1.0 mg/dL   Nitrite NEGATIVE NEGATIVE   Leukocytes, UA TRACE (A) NEGATIVE  Urine microscopic-add on  Result Value Ref Range   Squamous Epithelial / LPF RARE RARE   WBC, UA 0-2 <3 WBC/hpf   RBC / HPF 0-2 <3 RBC/hpf   Bacteria, UA RARE RARE  CBC with Differential/Platelet  Result Value Ref Range   WBC 8.8 4.0 - 10.5 K/uL   RBC 4.32 3.87 - 5.11 MIL/uL   Hemoglobin 13.1 12.0 - 15.0 g/dL   HCT 40.2 36.0 - 46.0 %   MCV 93.1 78.0 - 100.0 fL   MCH 30.3 26.0 - 34.0 pg   MCHC 32.6 30.0 - 36.0 g/dL   RDW 13.6 11.5 - 15.5 %  Platelets 323 150 - 400 K/uL   Neutrophils Relative % 68 43 - 77 %   Neutro Abs 6.0 1.7 - 7.7 K/uL   Lymphocytes Relative 25 12 - 46 %   Lymphs Abs 2.2 0.7 - 4.0 K/uL   Monocytes Relative 6 3 - 12 %   Monocytes Absolute 0.5 0.1 - 1.0 K/uL   Eosinophils Relative 1 0 - 5 %   Eosinophils Absolute 0.0 0.0 - 0.7 K/uL   Basophils Relative 0 0 - 1 %   Basophils Absolute 0.0 0.0 - 0.1 K/uL     Imaging Review Ct Abdomen Pelvis W Contrast  03/11/2014   CLINICAL DATA:  Right lower quadrant pain  EXAM: CT ABDOMEN AND PELVIS WITH CONTRAST  TECHNIQUE: Multidetector CT imaging of the abdomen and pelvis was performed using the standard protocol following bolus administration of intravenous contrast.  CONTRAST:  148mL OMNIPAQUE IOHEXOL 300 MG/ML  SOLN  COMPARISON:  None.  FINDINGS: Lung bases are free of acute infiltrate or sizable effusion.  The liver is diffusely decreased in attenuation consistent with fatty infiltration. The gallbladder has been surgically removed. The spleen, adrenal glands and pancreas are within normal limits. The kidneys are well visualized bilaterally with a normal enhancement pattern with the exception of cysts in the left kidney. No obstructive changes are seen.  The appendix is well visualized and within normal limits. Aortoiliac calcifications are noted without aneurysmal dilatation. The bladder is well distended. Diverticulosis without  diverticulitis is noted. The bony structures show degenerative change of the lumbar spine. Small fat containing infraumbilical hernia is noted in the midline.  IMPRESSION: Fatty liver.  Normal-appearing appendix.  No acute abnormality is seen.   Electronically Signed   By: Inez Catalina M.D.   On: 03/11/2014 14:17     EKG Interpretation None      MDM   Final diagnoses:  Abdominal pain    Patient with right lower quadrant intermittent abdominal discomfort since the beginning of the year. Worse in the past 2 days. Not associated with nausea vomiting or diarrhea. Associated with some constipation.  Workup for the right lower quadrant abdominal pain without any significant findings. No lab abnormalities no leukocytosis. No liver function test abnormalities no elevated lipase suggestive of pancreatitis. No evidence of diverticulitis on CT scan or any other significant findings. No evidence of appendicitis. Patient has GI medicine and can follow-up with them. As stated CT scan of the abdomen and pelvis without any acute findings.  Patient's nontoxic no acute distress. Patient stable for discharge home.   Fredia Sorrow, MD 03/11/14 [phone]

## 2014-03-11 NOTE — Telephone Encounter (Signed)
Appt today at 2:15 with Dr Damita Dunnings.

## 2014-03-11 NOTE — Telephone Encounter (Signed)
Please clarify this.  Note suggests ER.  Is she coming in or going to ER? Clearly if severe or emergent sx, shouldn't wait.  Thanks.

## 2014-03-11 NOTE — ED Notes (Signed)
CT called pt ready for procedure.

## 2014-03-11 NOTE — Telephone Encounter (Signed)
[facility] Patient Name: Molly [name] DOB: October 10, 1929 Initial Comment caller states she is having pain in her right abd - it is doubling her over Nurse Assessment Nurse: Ronnald Ramp, RN, Miranda Date/Time (Eastern Time): 03/11/2014 8:44:02 AM Confirm and document reason for call. If symptomatic, describe symptoms. ---Caller states she is having severe pain off and on in the right side of her lower abdomen. Pain started yesterday. Denies vomiting or diarrhea. Last BM was yesterday but was normal. Has the patient traveled out of the country within the last 30 days? ---Not Applicable Does the patient require triage? ---Yes Related visit to physician within the last 2 weeks? ---No Does the PT have any chronic conditions? (i.e. diabetes, asthma, etc.) ---Yes List chronic conditions. ---Thyroid, HTN Guidelines Guideline Title Affirmed Question Affirmed Notes Abdominal Pain - Female [1] SEVERE pain AND [2] age > 79 Final Disposition User Go to ED Now Ronnald Ramp, RN, [employer]

## 2014-03-11 NOTE — Telephone Encounter (Signed)
PLEASE NOTE: All timestamps contained within this report are represented as [place] Standard Time. CONFIDENTIALTY NOTICE: This fax transmission is intended only for the addressee. It contains information that is legally privileged, confidential or otherwise protected from use or disclosure. If you are not the intended recipient, you are strictly prohibited from reviewing, disclosing, copying using or disseminating any of this information or taking any action in reliance on or regarding this information. If you have received this fax in error, please notify us immediately by telephone so that we can arrange for its return to [place]. Phone: [phone], Toll-Free: [phone], Fax: [phone] Page: 1 of 2 Call Id: [id number] Hand Patient Name: Molly [name] Gender: Female DOB: 1929/12/19 Age: 81 Y 82 M 2 D Return Phone Number: [phone] (Primary), [phone] (Secondary) Address: [address] [place] [address] Client [facility] - Client Client Site [place] - Day Physician Renford Dills Contact Type Call Call Type Triage / Clinical Relationship To Patient Self Appointment Disposition EMR Appointment Not Necessary Return Phone Number [phone] (Primary) Chief Complaint ABDOMINAL PAIN - Severe and only in abdomen Initial Comment caller states she is having pain in her right abd - it is doubling her over PreDisposition Call Doctor Info pasted into Epic Yes Nurse Assessment Nurse: Ronnald Ramp, RN, Miranda Date/Time (Eastern Time): 03/11/2014 8:44:02 AM Confirm and document reason for call. If symptomatic, describe symptoms. ---Caller states she is having severe pain off and on in the right side of her lower abdomen. Pain started yesterday. Denies vomiting or diarrhea. Last BM was yesterday but was normal. Has the patient  traveled out of the country within the last 30 days? ---Not Applicable Does the patient require triage? ---Yes Related visit to physician within the last 2 weeks? ---No Does the PT have any chronic conditions? (i.e. diabetes, asthma, etc.) ---Yes List chronic conditions. ---Thyroid, HTN Guidelines Guideline Title Affirmed Question Affirmed Notes Nurse Date/Time Eilene Ghazi Time) Abdominal Pain - Female [1] SEVERE pain AND [2] age > 73 Ronnald Ramp, RN, Miranda 03/11/2014 8:47:31 AM Disp. Time Eilene Ghazi Time) Disposition Final User 03/11/2014 8:42:00 AM Send to Urgent Queue Byrd Hesselbach 03/11/2014 8:50:35 AM Go to ED Now Yes Ronnald Ramp, RN, Judge Stall Understands: Yes Disagree/Comply: Comply PLEASE NOTE: All timestamps contained within this report are represented as [place] Standard Time. CONFIDENTIALTY NOTICE: This fax transmission is intended only for the addressee. It contains information that is legally privileged, confidential or otherwise protected from use or disclosure. If you are not the intended recipient, you are strictly prohibited from reviewing, disclosing, copying using or disseminating any of this information or taking any action in reliance on or regarding this information. If you have received this fax in error, please notify us immediately by telephone so that we can arrange for its return to [place]. Phone: [phone], Toll-Free: [phone], Fax: [phone] Page: 2 of 2 Call Id: [id number] Care Advice Given Per Guideline GO TO ED NOW: You need to be seen in the Emergency Department. Go to the ER at ___________ [place] now. Drive carefully. DRIVING: Another adult should drive. NOTHING BY MOUTH: Do not eat or drink anything for now. (Reason: condition may need surgery and general anesthesia) CARE ADVICE given per Abdominal Pain, Female (Adult) guideline. BRING MEDICINES: * Please bring a list of your current medicines when you go to the Emergency Department (ER). * It is  also a good idea  to bring the pill bottles too. This will help the doctor to make certain you are taking the right medicines and the right dose. After Care Instructions Given Call Event Type User Date / Time Description Referrals [facility] - ED

## 2014-03-11 NOTE — ED Notes (Signed)
CT notified pt finished drinking contrast  

## 2014-03-11 NOTE — Telephone Encounter (Signed)
Noted, thanks!

## 2014-03-11 NOTE — Discharge Instructions (Signed)
Workup for the abdominal pain without any significant lab abnormalities were abnormalities on the CAT scan. Make appointment to follow-up with your GI doctor. Return for any new or worse symptoms.

## 2014-03-12 ENCOUNTER — Ambulatory Visit (INDEPENDENT_AMBULATORY_CARE_PROVIDER_SITE_OTHER): Payer: PPO | Admitting: Internal Medicine

## 2014-03-12 ENCOUNTER — Encounter: Payer: Self-pay | Admitting: Family Medicine

## 2014-03-12 ENCOUNTER — Telehealth: Payer: Self-pay

## 2014-03-12 ENCOUNTER — Ambulatory Visit (INDEPENDENT_AMBULATORY_CARE_PROVIDER_SITE_OTHER)
Admission: RE | Admit: 2014-03-12 | Discharge: 2014-03-12 | Disposition: A | Discharge status: Home / Self Care | Payer: PPO | Source: Ambulatory Visit | Attending: Internal Medicine | Admitting: Internal Medicine

## 2014-03-12 VITALS — BP 124/70 | HR 61 | Temp 97.9°F | Resp 16 | Ht 62.75 in | Wt 153.0 lb

## 2014-03-12 DIAGNOSIS — R109 Unspecified abdominal pain: Secondary | ICD-10-CM

## 2014-03-12 DIAGNOSIS — G8929 Other chronic pain: Secondary | ICD-10-CM | POA: Insufficient documentation

## 2014-03-12 DIAGNOSIS — R1011 Right upper quadrant pain: Secondary | ICD-10-CM

## 2014-03-12 DIAGNOSIS — I10 Essential (primary) hypertension: Secondary | ICD-10-CM

## 2014-03-12 DIAGNOSIS — R1031 Right lower quadrant pain: Secondary | ICD-10-CM

## 2014-03-12 NOTE — Patient Instructions (Signed)

## 2014-03-12 NOTE — Telephone Encounter (Signed)
I would keep the appointment with Ronnald Ramp and see what he has to offer.  I'm glad the CT was negative.

## 2014-03-12 NOTE — Telephone Encounter (Signed)
Pt was seen at [facility] ED and had scan which was negative; pt still has rt sided lower abd pain continues on and off. Pt has dull ache all the time but when moves a certain way has sharp pain. No fever and no constipation. No N&V. Pt already has appt with Dr Scarlette Calico today at 2:30 pm but pt wants to know what Dr Damita Dunnings thinks she should do. Advised pt will send note to Dr Damita Dunnings and his CMA will cb but pt should keep appt today with Dr Ronnald Ramp. Pt voiced understanding.

## 2014-03-12 NOTE — Progress Notes (Signed)
Subjective:    Patient ID: Molly [name], female    DOB: 05-16-1929, 81 y.o.   MRN: [id number]  Flank Pain This is a recurrent problem. The current episode started in the past 7 days. The problem occurs intermittently. The problem has been gradually improving since onset. Pain location: rt flank and rt lower abd. The quality of the pain is described as burning. The pain does not radiate. The pain is at a severity of 1/10. The pain is mild. The pain is worse during the day. The symptoms are aggravated by twisting. Associated symptoms include abdominal pain. Pertinent negatives include no bladder incontinence, bowel incontinence, chest pain, dysuria, fever, headaches, leg pain, numbness, paresis, paresthesias, pelvic pain, perianal numbness, tingling, weakness or weight loss. She has tried nothing for the symptoms.      Review of Systems  Constitutional: Negative.  Negative for fever, chills, weight loss, diaphoresis, appetite change and fatigue.  HENT: Negative.   Eyes: Negative.   Respiratory: Negative.  Negative for cough, choking, chest tightness, shortness of breath and stridor.   Cardiovascular: Negative.  Negative for chest pain, palpitations and leg swelling.  Gastrointestinal: Positive for abdominal pain. Negative for nausea, vomiting, diarrhea, constipation, blood in stool, abdominal distention, anal bleeding, rectal pain and bowel incontinence.  Endocrine: Negative for polydipsia, polyphagia and polyuria.  Genitourinary: Positive for flank pain. Negative for bladder incontinence, dysuria, urgency, frequency, hematuria, decreased urine volume, vaginal bleeding, vaginal discharge, enuresis, difficulty urinating, genital sores, vaginal pain, menstrual problem, pelvic pain and dyspareunia.  Musculoskeletal: Negative.   Skin: Negative.   Allergic/Immunologic: Negative.   Neurological: Negative.  Negative for tingling, weakness, numbness, headaches and paresthesias.  Hematological:  Negative.  Negative for adenopathy. Does not bruise/bleed easily.  Psychiatric/Behavioral: Negative.        Objective:   Physical Exam  Constitutional: She is oriented to person, place, and time. She appears well-developed and well-nourished.  Non-toxic appearance. She does not have a sickly appearance. She does not appear ill. No distress.  HENT:  Head: Normocephalic and atraumatic.  Mouth/Throat: Oropharynx is clear and moist. No oropharyngeal exudate.  Eyes: Conjunctivae are normal. Right eye exhibits no discharge. Left eye exhibits no discharge. No scleral icterus.  Neck: Normal range of motion. Neck supple. No JVD present. No tracheal deviation present. No thyromegaly present.  Cardiovascular: Normal rate, regular rhythm, normal heart sounds and intact distal pulses.  Exam reveals no gallop and no friction rub.   No murmur heard. Pulmonary/Chest: Effort normal and breath sounds normal. No stridor. No respiratory distress. She has no wheezes. She has no rales. She exhibits no tenderness.  Abdominal: Soft. Normal appearance and bowel sounds are normal. She exhibits no shifting dullness, no distension, no pulsatile liver, no fluid wave, no abdominal bruit, no ascites, no pulsatile midline mass and no mass. There is no hepatosplenomegaly or splenomegaly. There is tenderness in the right upper quadrant. There is no rigidity, no rebound, no guarding, no CVA tenderness, no tenderness at McBurney's point and negative Murphy's sign. No hernia. Hernia confirmed negative in the ventral area, confirmed negative in the right inguinal area and confirmed negative in the left inguinal area.    Musculoskeletal: Normal range of motion. She exhibits no edema or tenderness.  Lymphadenopathy:    She has no cervical adenopathy.  Neurological: She is oriented to person, place, and time.  Skin: Skin is warm and dry. No rash noted. She is not diaphoretic. No erythema. No pallor.  Psychiatric: She has a normal  mood and affect. Her behavior is normal. Judgment and thought content normal.  Vitals reviewed.    Results for Molly [name], Molly [name] (MRN [id number]) as of 03/12/2014 14:56  Ref. Range 03/11/2014 11:04 03/11/2014 11:23 03/11/2014 11:34  Sodium Latest Range: 135-145 mmol/L 141    Potassium Latest Range: 3.5-5.1 mmol/L 3.6    Chloride Latest Range: 96-112 mmol/L 104    CO2 Latest Range: 19-32 mmol/L 31    BUN Latest Range: 6-23 mg/dL 12    Creatinine Latest Range: 0.50-1.10 mg/dL 0.69    Calcium Latest Range: 8.4-10.5 mg/dL 9.4    GFR calc non Af Amer Latest Range: >90 mL/min 79 (L)    GFR calc Af Amer Latest Range: >90 mL/min >90    Glucose Latest Range: 70-99 mg/dL 123 (H)    Anion gap Latest Range: 5-15  6    Alkaline Phosphatase Latest Range: 39-117 U/L 69    Albumin Latest Range: 3.5-5.2 g/dL 3.6    Lipase Latest Range: 11-59 U/L 25    AST Latest Range: 0-37 U/L 19    ALT Latest Range: 0-35 U/L 15    Total Protein Latest Range: 6.0-8.3 g/dL 7.2    Total Bilirubin Latest Range: 0.3-1.2 mg/dL 0.8    WBC Latest Range: 4.0-10.5 K/uL  8.8   RBC Latest Range: 3.87-5.11 MIL/uL  4.32   Hemoglobin Latest Range: 12.0-15.0 g/dL  13.1   HCT Latest Range: 36.0-46.0 %  40.2   MCV Latest Range: 78.0-100.0 fL  93.1   MCH Latest Range: 26.0-34.0 pg  30.3   MCHC Latest Range: 30.0-36.0 g/dL  32.6   RDW Latest Range: 11.5-15.5 %  13.6   Platelets Latest Range: 150-400 K/uL  323   Neutrophils Relative % Latest Range: 43-77 %  68   Lymphocytes Relative Latest Range: 12-46 %  25   Monocytes Relative Latest Range: 3-12 %  6   Eosinophils Relative Latest Range: 0-5 %  1   Basophils Relative Latest Range: 0-1 %  0   NEUT# Latest Range: 1.7-7.7 K/uL  6.0   Lymphocytes Absolute Latest Range: 0.7-4.0 K/uL  2.2   Monocytes Absolute Latest Range: 0.1-1.0 K/uL  0.5   Eosinophils Absolute Latest Range: 0.0-0.7 K/uL  0.0   Basophils Absolute Latest Range: 0.0-0.1 K/uL  0.0   Color, Urine Latest Range: YELLOW     YELLOW  APPearance Latest Range: CLEAR    CLEAR  Specific Gravity, Urine Latest Range: 1.005-1.030    1.009  pH Latest Range: 5.0-8.0    7.0  Glucose Latest Range: NEGATIVE mg/dL   NEGATIVE  Bilirubin Urine Latest Range: NEGATIVE    NEGATIVE  Ketones, ur Latest Range: NEGATIVE mg/dL   NEGATIVE  Protein Latest Range: NEGATIVE mg/dL   NEGATIVE  Urobilinogen, UA Latest Range: 0.0-1.0 mg/dL   0.2  Nitrite Latest Range: NEGATIVE    NEGATIVE  Leukocytes, UA Latest Range: NEGATIVE    TRACE (A)  Hgb urine dipstick Latest Range: NEGATIVE    NEGATIVE  WBC, UA Latest Range: <3 WBC/hpf   0-2  RBC / HPF Latest Range: <3 RBC/hpf   0-2  Squamous Epithelial / LPF Latest Range: RARE    RARE  Bacteria, UA Latest Range: RARE    RARE       Assessment & Plan:

## 2014-03-12 NOTE — Telephone Encounter (Signed)
Patient advised.

## 2014-03-12 NOTE — Progress Notes (Signed)
Pre visit review using our clinic review tool, if applicable. No additional management support is needed unless otherwise documented below in the visit note. 

## 2014-03-13 ENCOUNTER — Encounter: Payer: Self-pay | Admitting: Internal Medicine

## 2014-03-13 NOTE — Assessment & Plan Note (Signed)
Her BP is well controlled 

## 2014-03-13 NOTE — Assessment & Plan Note (Signed)
She appears to to have abd wall pain consistent with MS pain She has been offered meds for pain but she has elected not to take them, I asked to reconsider and to start taking something for the pain She will let me know if she develops and new or worsening symptoms

## 2014-04-07 ENCOUNTER — Encounter: Payer: Self-pay | Admitting: Family Medicine

## 2014-04-07 ENCOUNTER — Ambulatory Visit (INDEPENDENT_AMBULATORY_CARE_PROVIDER_SITE_OTHER)
Admission: RE | Admit: 2014-04-07 | Discharge: 2014-04-07 | Disposition: A | Discharge status: Home / Self Care | Payer: PPO | Source: Ambulatory Visit | Attending: Family Medicine | Admitting: Family Medicine

## 2014-04-07 ENCOUNTER — Ambulatory Visit (INDEPENDENT_AMBULATORY_CARE_PROVIDER_SITE_OTHER): Payer: PPO | Admitting: Family Medicine

## 2014-04-07 VITALS — BP 148/78 | HR 69 | Temp 98.1°F | Wt 150.8 lb

## 2014-04-07 DIAGNOSIS — R1011 Right upper quadrant pain: Secondary | ICD-10-CM

## 2014-04-07 DIAGNOSIS — R109 Unspecified abdominal pain: Secondary | ICD-10-CM

## 2014-04-07 DIAGNOSIS — R1031 Right lower quadrant pain: Secondary | ICD-10-CM

## 2014-04-07 NOTE — Patient Instructions (Signed)
Take two ibuprofen with food up to 3 times a day.  Go to the lab on the way out.  We'll contact you with your xray report. We'll go from there.  We may need to get you to ortho or PT.   Take care.  Glad to see you.

## 2014-04-07 NOTE — Assessment & Plan Note (Signed)
We are going to get dedicated L spine films.  D/w pt.  She could have a radicular source.  She has no sciatica.  No cutaneous changes.   Abd/oblique strain is within the ddx but would be atypical given duration and hx.   We may need to refer to PT vs ortho.  D/w pt.  See notes on films.   She agrees with plan.  >25 minutes spent in face to face time with patient, >50% spent in counselling or coordination of care.

## 2014-04-07 NOTE — Progress Notes (Signed)
Pre visit review using our clinic review tool, if applicable. No additional management support is needed unless otherwise documented below in the visit note.  Occ RLQ pain, since early 2016, intermittent.  It can hurt enough to where she would fall prev.  "It catches me and then it grabs me."  No clear triggers.  Seen at ER, CT neg. She had incidental L sided renal cysts, fatty liver, and DJD in her back noted on CT.  No acute finding on CT.  Occ BRBPR but no other GI sx and the BRBPR doesn't correspond to the abd pain.  She has known hemorrhoids.  It is a sudden pain that can come up quickly.  Repositioning can sometimes help.  She tends to notice it when getting up from a recliner.  No L sided sx.  No rash.   She didn't know if she had a back source that was causing the abd pain.   Meds, vitals, and allergies reviewed.   ROS: See HPI.  Otherwise, noncontributory.  nad ncat Neck supple rrr ctab abd soft, not ttp, the L lower quadrant isn't ttp itself Back w/o midline pain She has pain along the R flank but she isn't tender there per se.  Pain with forward flexion.

## 2014-04-21 ENCOUNTER — Encounter: Payer: Self-pay | Admitting: Family Medicine

## 2014-04-21 ENCOUNTER — Ambulatory Visit (INDEPENDENT_AMBULATORY_CARE_PROVIDER_SITE_OTHER): Payer: PPO | Admitting: Family Medicine

## 2014-04-21 VITALS — BP 108/58 | HR 71 | Temp 97.8°F

## 2014-04-21 DIAGNOSIS — R109 Unspecified abdominal pain: Secondary | ICD-10-CM

## 2014-04-21 DIAGNOSIS — R1011 Right upper quadrant pain: Secondary | ICD-10-CM

## 2014-04-21 NOTE — Patient Instructions (Signed)
I'll work on getting the MRI set up and we'll go from there.   Take care.

## 2014-04-21 NOTE — Progress Notes (Signed)
Pre visit review using our clinic review tool, if applicable. No additional management support is needed unless otherwise documented below in the visit note.  She can still get R sided flank pain that radiates around the torso.  Never on the L side.  It can be a sudden pain.  Worse with repositioning, "with the wrong move."   She has more episodes and more pain with each episode in the meantime.  No rash.  No leg sx.  No help with nsaids.  Going on longer than 1 month.  Prev CT and plain films noted, reviewed with patient.   Meds, vitals, and allergies reviewed.   ROS: See HPI.  Otherwise, noncontributory.  nad ncat rrr ctab abd soft, normal BS Back not ttp in midline R flank not ttp, no bruising.

## 2014-04-22 NOTE — Assessment & Plan Note (Addendum)
It looks like she does have R radicular pain, but on the borderline of lower T spine and upper L spine.  It doesn't radiate to the R leg.  I asked for rady input and it is reasonable to get MRI L spine with inclusion of up to T10.  Ordered.   We can refer to neurosurgery if needed.  She still has sensation and motor function intact in the BLE.

## 2014-05-01 ENCOUNTER — Ambulatory Visit
Admission: RE | Admit: 2014-05-01 | Discharge: 2014-05-01 | Disposition: A | Discharge status: Home / Self Care | Payer: PPO | Source: Ambulatory Visit | Attending: Family Medicine | Admitting: Family Medicine

## 2014-05-01 ENCOUNTER — Other Ambulatory Visit: Payer: Self-pay | Admitting: Family Medicine

## 2014-05-01 DIAGNOSIS — R109 Unspecified abdominal pain: Secondary | ICD-10-CM

## 2014-05-05 ENCOUNTER — Other Ambulatory Visit: Payer: Self-pay

## 2014-05-06 ENCOUNTER — Encounter: Payer: Self-pay | Admitting: Gastroenterology

## 2014-08-14 ENCOUNTER — Other Ambulatory Visit: Payer: Self-pay

## 2014-08-14 MED ORDER — ATENOLOL 25 MG PO TABS: 25.0000 mg | ORAL_TABLET | Freq: Every day | ORAL | Status: DC

## 2014-08-14 MED ORDER — HYDROCHLOROTHIAZIDE 12.5 MG PO CAPS: 12.5000 mg | ORAL_CAPSULE | Freq: Every day | ORAL | Status: DC

## 2014-08-14 NOTE — Telephone Encounter (Signed)
Pt request refill atenolol and HCTZ to [facility]. Pt will cb and scheduled CPX before med runs out.advised pt refill done as requested.

## 2014-09-08 ENCOUNTER — Other Ambulatory Visit: Payer: Self-pay | Admitting: Family Medicine

## 2014-09-30 ENCOUNTER — Encounter: Payer: Self-pay | Admitting: Family Medicine

## 2014-09-30 ENCOUNTER — Ambulatory Visit (INDEPENDENT_AMBULATORY_CARE_PROVIDER_SITE_OTHER): Payer: PPO | Admitting: Family Medicine

## 2014-09-30 VITALS — BP 126/66 | HR 60 | Temp 98.6°F | Wt 144.0 lb

## 2014-09-30 DIAGNOSIS — R05 Cough: Secondary | ICD-10-CM

## 2014-09-30 DIAGNOSIS — R059 Cough, unspecified: Secondary | ICD-10-CM

## 2014-09-30 MED ORDER — DOXYCYCLINE HYCLATE 100 MG PO TABS: 100.0000 mg | ORAL_TABLET | Freq: Two times a day (BID) | ORAL | Status: DC

## 2014-09-30 NOTE — Progress Notes (Signed)
Pre visit review using our clinic review tool, if applicable. No additional management support is needed unless otherwise documented below in the visit note.  Sick for about 1 week. Rhinorrhea.  Some cough.  ST.  Now with some sputum, small amounts usually when she does cough some up.  Sputum is discolored.  Voice is altered.  No ear pain.  Some facial pain prev, better now.  Not SOB.  No BLE edema.  No wheeze.  Tired.  No fevers.  No aches.    Meds, vitals, and allergies reviewed.   ROS: See HPI.  Otherwise, noncontributory.  GEN: nad, alert and oriented HEENT: mucous membranes moist, tm w/o erythema, nasal exam w/o erythema, scant clear discharge noted,  OP with mild cobblestoning NECK: supple w/o LA CV: rrr.   PULM: ctab, no inc wob EXT: no edema SKIN: no acute rash

## 2014-09-30 NOTE — Patient Instructions (Signed)
Use robitussin for a few more days and drink plenty of fluids.   If not better in a few days, then start the antibiotics.  Take care.  Glad to see you.

## 2014-10-01 DIAGNOSIS — R059 Cough, unspecified: Secondary | ICD-10-CM | POA: Insufficient documentation

## 2014-10-01 DIAGNOSIS — R05 Cough: Secondary | ICD-10-CM | POA: Insufficient documentation

## 2014-10-01 NOTE — Assessment & Plan Note (Signed)
Nontoxic, ddx d/w pt . Could still be viral.   Okay for outpatient f/u.  Use robitussin for a few more days and drink plenty of fluids.  If not better in a few days, then start doxy.  She agrees.   Update me as needed.

## 2014-11-11 ENCOUNTER — Other Ambulatory Visit: Payer: Self-pay | Admitting: Family Medicine

## 2014-11-15 ENCOUNTER — Other Ambulatory Visit: Payer: Self-pay | Admitting: Family Medicine

## 2014-11-15 DIAGNOSIS — I10 Essential (primary) hypertension: Secondary | ICD-10-CM

## 2014-11-15 DIAGNOSIS — E039 Hypothyroidism, unspecified: Secondary | ICD-10-CM

## 2014-11-15 DIAGNOSIS — E559 Vitamin D deficiency, unspecified: Secondary | ICD-10-CM

## 2014-11-17 ENCOUNTER — Other Ambulatory Visit (INDEPENDENT_AMBULATORY_CARE_PROVIDER_SITE_OTHER): Payer: PPO

## 2014-11-17 DIAGNOSIS — I10 Essential (primary) hypertension: Secondary | ICD-10-CM

## 2014-11-17 DIAGNOSIS — E559 Vitamin D deficiency, unspecified: Secondary | ICD-10-CM

## 2014-11-17 DIAGNOSIS — E039 Hypothyroidism, unspecified: Secondary | ICD-10-CM

## 2014-11-17 LAB — LIPID PANEL
CHOLESTEROL: 260 mg/dL — AB (ref 0–200)
HDL: 60.1 mg/dL (ref >39.00)
LDL Cholesterol: 165 mg/dL — ABNORMAL HIGH (ref 0–99)
NonHDL: 199.76
TRIGLYCERIDES: 175 mg/dL — AB (ref 0.0–149.0)
Total CHOL/HDL Ratio: 4
VLDL: 35 mg/dL (ref 0.0–40.0)

## 2014-11-17 LAB — COMPREHENSIVE METABOLIC PANEL
ALBUMIN: 3.8 g/dL (ref 3.5–5.2)
ALT: 12 U/L (ref 0–35)
AST: 17 U/L (ref 0–37)
Alkaline Phosphatase: 62 U/L (ref 39–117)
BILIRUBIN TOTAL: 0.5 mg/dL (ref 0.2–1.2)
BUN: 14 mg/dL (ref 6–23)
CALCIUM: 9.8 mg/dL (ref 8.4–10.5)
CO2: 29 meq/L (ref 19–32)
CREATININE: 0.68 mg/dL (ref 0.40–1.20)
Chloride: 104 mEq/L (ref 96–112)
GFR: 87.8 mL/min (ref >60.00)
Glucose, Bld: 108 mg/dL — ABNORMAL HIGH (ref 70–99)
Potassium: 3.7 mEq/L (ref 3.5–5.1)
Sodium: 142 mEq/L (ref 135–145)
Total Protein: 7 g/dL (ref 6.0–8.3)

## 2014-11-17 LAB — VITAMIN D 25 HYDROXY (VIT D DEFICIENCY, FRACTURES): VITD: 20.08 ng/mL — ABNORMAL LOW (ref 30.00–100.00)

## 2014-11-17 LAB — TSH: TSH: 1.1 u[IU]/mL (ref 0.35–4.50)

## 2014-11-23 ENCOUNTER — Encounter: Payer: Self-pay | Admitting: Family Medicine

## 2014-11-23 ENCOUNTER — Other Ambulatory Visit: Payer: Self-pay

## 2014-11-23 ENCOUNTER — Ambulatory Visit (INDEPENDENT_AMBULATORY_CARE_PROVIDER_SITE_OTHER): Payer: PPO | Admitting: Family Medicine

## 2014-11-23 VITALS — BP 148/74 | HR 89 | Temp 98.5°F | Ht 63.0 in | Wt 146.5 lb

## 2014-11-23 DIAGNOSIS — I10 Essential (primary) hypertension: Secondary | ICD-10-CM

## 2014-11-23 DIAGNOSIS — E039 Hypothyroidism, unspecified: Secondary | ICD-10-CM

## 2014-11-23 DIAGNOSIS — R7301 Impaired fasting glucose: Secondary | ICD-10-CM | POA: Diagnosis not present

## 2014-11-23 DIAGNOSIS — Z23 Encounter for immunization: Secondary | ICD-10-CM

## 2014-11-23 DIAGNOSIS — Z1231 Encounter for screening mammogram for malignant neoplasm of breast: Secondary | ICD-10-CM

## 2014-11-23 DIAGNOSIS — E559 Vitamin D deficiency, unspecified: Secondary | ICD-10-CM

## 2014-11-23 DIAGNOSIS — E782 Mixed hyperlipidemia: Secondary | ICD-10-CM

## 2014-11-23 DIAGNOSIS — Z780 Asymptomatic menopausal state: Secondary | ICD-10-CM

## 2014-11-23 DIAGNOSIS — Z0000 Encounter for general adult medical examination without abnormal findings: Secondary | ICD-10-CM

## 2014-11-23 DIAGNOSIS — M545 Low back pain: Secondary | ICD-10-CM

## 2014-11-23 MED ORDER — CHOLECALCIFEROL 50 MCG (2000 UT) PO CAPS: 2000.0000 [IU] | ORAL_CAPSULE | Freq: Every day | ORAL | Status: DC

## 2014-11-23 MED ORDER — HYDROCHLOROTHIAZIDE 12.5 MG PO CAPS: 12.5000 mg | ORAL_CAPSULE | Freq: Every day | ORAL | Status: DC

## 2014-11-23 MED ORDER — ATENOLOL 25 MG PO TABS: 25.0000 mg | ORAL_TABLET | Freq: Every day | ORAL | Status: DC

## 2014-11-23 MED ORDER — LEVOTHYROXINE SODIUM 88 MCG PO TABS: 88.0000 ug | ORAL_TABLET | Freq: Every day | ORAL | Status: DC

## 2014-11-23 NOTE — Patient Instructions (Addendum)
Please call about an appointment at the spine clinic.   Rosaria Ferries will call about your referral (bone density due 12/2014).   Get OTC vitamin D and start 2000 units a day.   Take care.  Glad to see you.  Don't change your meds otherwise.

## 2014-11-23 NOTE — Progress Notes (Signed)
Pre visit review using our clinic review tool, if applicable. No additional management support is needed unless otherwise documented below in the visit note.  I have personally reviewed the Medicare Annual Wellness questionnaire and have noted 1. The patient's medical and social history 2. Their use of alcohol, tobacco or illicit drugs 3. Their current medications and supplements 4. The patient's functional ability including ADL's, fall risks, home safety risks and hearing or visual             impairment. 5. Diet and physical activities 6. Evidence for depression or mood disorders  The patients weight, height, BMI have been recorded in the chart and visual acuity is per eye clinic.  I have made referrals, counseling and provided education to the patient based review of the above and I have provided the pt with a written personalized care plan for preventive services.  Provider list updated- see scanned forms.  Routine anticipatory guidance given to patient.  See health maintenance.  Flu 2016 Shingles 2009 PNA 2016 Tetanus 2009 Colonoscopy NA due to age, pt declined.  This is reasonable.  Breast cancer screening due 12/2014 she'll call about that DXA due 12/2014, ordered.  Advance directive- husband designated if patient were incapacitated.   Cognitive function addressed- see scanned forms- and if abnormal then additional documentation follows.   Hypertension:    Using medication without problems or lightheadedness: yes Chest pain with exertion:no Edema:no Short of breath:no  HLD.  Statin intolerant.  Labs d/w pt.  D/w pt about diet and exercise.    Hyperglycemia. Labs d/w pt.  D/w pt about diet and exercise.    Vit D deficiency.  Labs d/w pt. Has dairy in diet.  Not on extra vit D other than MVI.   D/w pt.    Hypothyroid.  No lumps, mass, swelling.  No dysphagia.  Some fatigue, but this may be normal for her age.  TSH wnl.    Her back pain was some better after prev injection  by Dr. Rita Ohara.  She has intermittent discomfort.  She is considering f/u with Dr. Rita Ohara.    PMH and SH reviewed  Meds, vitals, and allergies reviewed.   ROS: See HPI.  Otherwise negative.    GEN: nad, alert and oriented HEENT: mucous membranes moist NECK: supple w/o LA CV: rrr. PULM: ctab, no inc wob ABD: soft, +bs EXT: no edema SKIN: no acute rash

## 2014-11-25 DIAGNOSIS — E559 Vitamin D deficiency, unspecified: Secondary | ICD-10-CM | POA: Insufficient documentation

## 2014-11-25 NOTE — Assessment & Plan Note (Signed)
Reasonable control, I don't want to induce hypotension, continue as is.  She agrees.

## 2014-11-25 NOTE — Assessment & Plan Note (Signed)
TSH wnl, continue as is.  She agrees.  

## 2014-11-25 NOTE — Assessment & Plan Note (Signed)
She'll call about f/u, see above.

## 2014-11-25 NOTE — Assessment & Plan Note (Signed)
Labs d/w pt.  Statin intolerant.  Continue with diet and exercise.

## 2014-11-25 NOTE — Assessment & Plan Note (Signed)
D/w pt.  Continue diet and exercise as tolerated.

## 2014-11-25 NOTE — Assessment & Plan Note (Signed)
Flu 2016 Shingles 2009 PNA 2016 Tetanus 2009 Colonoscopy NA due to age, pt declined.  This is reasonable.  Breast cancer screening due 12/2014 she'll call about that DXA due 12/2014, ordered.  Advance directive- husband designated if patient were incapacitated.   Cognitive function addressed- see scanned forms- and if abnormal then additional documentation follows.

## 2014-11-25 NOTE — Assessment & Plan Note (Signed)
D/w pt re: labs.  Get OTC vitamin D and start 2000 units a day.  She agrees.

## 2014-12-31 ENCOUNTER — Ambulatory Visit
Admission: RE | Admit: 2014-12-31 | Discharge: 2014-12-31 | Disposition: A | Discharge status: Home / Self Care | Payer: PPO | Source: Ambulatory Visit | Attending: Family Medicine | Admitting: Family Medicine

## 2014-12-31 ENCOUNTER — Other Ambulatory Visit: Payer: Self-pay | Admitting: Family Medicine

## 2014-12-31 ENCOUNTER — Inpatient Hospital Stay: Admission: RE | Admit: 2014-12-31 | Payer: PPO | Source: Ambulatory Visit

## 2014-12-31 ENCOUNTER — Ambulatory Visit
Admission: RE | Admit: 2014-12-31 | Discharge: 2014-12-31 | Disposition: A | Discharge status: Home / Self Care | Payer: PPO | Source: Ambulatory Visit

## 2014-12-31 DIAGNOSIS — E2839 Other primary ovarian failure: Secondary | ICD-10-CM

## 2014-12-31 DIAGNOSIS — Z1231 Encounter for screening mammogram for malignant neoplasm of breast: Secondary | ICD-10-CM

## 2014-12-31 DIAGNOSIS — M8580 Other specified disorders of bone density and structure, unspecified site: Secondary | ICD-10-CM

## 2015-01-01 DIAGNOSIS — M8580 Other specified disorders of bone density and structure, unspecified site: Secondary | ICD-10-CM | POA: Insufficient documentation

## 2015-01-20 ENCOUNTER — Encounter (HOSPITAL_COMMUNITY)
Admission: EM | Disposition: A | Discharge status: Home / Self Care | Payer: Self-pay | Source: Home / Self Care | Attending: Internal Medicine

## 2015-01-20 ENCOUNTER — Encounter (HOSPITAL_COMMUNITY): Payer: Self-pay | Admitting: Emergency Medicine

## 2015-01-20 ENCOUNTER — Emergency Department (HOSPITAL_COMMUNITY): Payer: PPO

## 2015-01-20 ENCOUNTER — Other Ambulatory Visit: Payer: Self-pay | Admitting: Family Medicine

## 2015-01-20 ENCOUNTER — Inpatient Hospital Stay (HOSPITAL_COMMUNITY)
Admission: EM | Admit: 2015-01-20 | Discharge: 2015-01-22 | DRG: 116 | Disposition: A | Discharge status: Home / Self Care | Payer: PPO | Attending: Internal Medicine | Admitting: Internal Medicine

## 2015-01-20 DIAGNOSIS — E039 Hypothyroidism, unspecified: Secondary | ICD-10-CM | POA: Diagnosis not present

## 2015-01-20 DIAGNOSIS — Z7982 Long term (current) use of aspirin: Secondary | ICD-10-CM | POA: Diagnosis not present

## 2015-01-20 DIAGNOSIS — K5900 Constipation, unspecified: Secondary | ICD-10-CM | POA: Diagnosis not present

## 2015-01-20 DIAGNOSIS — S0232XA Fracture of orbital floor, left side, initial encounter for closed fracture: Secondary | ICD-10-CM | POA: Diagnosis present

## 2015-01-20 DIAGNOSIS — E785 Hyperlipidemia, unspecified: Secondary | ICD-10-CM | POA: Diagnosis present

## 2015-01-20 DIAGNOSIS — I10 Essential (primary) hypertension: Secondary | ICD-10-CM | POA: Diagnosis not present

## 2015-01-20 DIAGNOSIS — S01119A Laceration without foreign body of unspecified eyelid and periocular area, initial encounter: Secondary | ICD-10-CM | POA: Diagnosis present

## 2015-01-20 DIAGNOSIS — S0292XA Unspecified fracture of facial bones, initial encounter for closed fracture: Secondary | ICD-10-CM | POA: Diagnosis not present

## 2015-01-20 DIAGNOSIS — S01112A Laceration without foreign body of left eyelid and periocular area, initial encounter: Secondary | ICD-10-CM | POA: Diagnosis present

## 2015-01-20 DIAGNOSIS — S0240DA Maxillary fracture, left side, initial encounter for closed fracture: Secondary | ICD-10-CM | POA: Diagnosis present

## 2015-01-20 DIAGNOSIS — R110 Nausea: Secondary | ICD-10-CM | POA: Diagnosis not present

## 2015-01-20 DIAGNOSIS — H409 Unspecified glaucoma: Secondary | ICD-10-CM | POA: Diagnosis present

## 2015-01-20 DIAGNOSIS — H2102 Hyphema, left eye: Secondary | ICD-10-CM | POA: Diagnosis not present

## 2015-01-20 DIAGNOSIS — F0781 Postconcussional syndrome: Secondary | ICD-10-CM | POA: Diagnosis not present

## 2015-01-20 DIAGNOSIS — R531 Weakness: Secondary | ICD-10-CM | POA: Diagnosis not present

## 2015-01-20 DIAGNOSIS — Z79899 Other long term (current) drug therapy: Secondary | ICD-10-CM | POA: Diagnosis not present

## 2015-01-20 DIAGNOSIS — Y92009 Unspecified place in unspecified non-institutional (private) residence as the place of occurrence of the external cause: Secondary | ICD-10-CM

## 2015-01-20 DIAGNOSIS — S0240FA Zygomatic fracture, left side, initial encounter for closed fracture: Secondary | ICD-10-CM | POA: Diagnosis not present

## 2015-01-20 DIAGNOSIS — Z961 Presence of intraocular lens: Secondary | ICD-10-CM | POA: Diagnosis not present

## 2015-01-20 DIAGNOSIS — Z8542 Personal history of malignant neoplasm of other parts of uterus: Secondary | ICD-10-CM | POA: Diagnosis not present

## 2015-01-20 DIAGNOSIS — W108XXA Fall (on) (from) other stairs and steps, initial encounter: Secondary | ICD-10-CM | POA: Diagnosis not present

## 2015-01-20 DIAGNOSIS — S0285XA Fracture of orbit, unspecified, initial encounter for closed fracture: Secondary | ICD-10-CM

## 2015-01-20 DIAGNOSIS — S0532XA Ocular laceration without prolapse or loss of intraocular tissue, left eye, initial encounter: Secondary | ICD-10-CM | POA: Diagnosis not present

## 2015-01-20 DIAGNOSIS — K219 Gastro-esophageal reflux disease without esophagitis: Secondary | ICD-10-CM | POA: Diagnosis present

## 2015-01-20 DIAGNOSIS — E876 Hypokalemia: Secondary | ICD-10-CM | POA: Diagnosis not present

## 2015-01-20 DIAGNOSIS — R51 Headache: Secondary | ICD-10-CM | POA: Diagnosis not present

## 2015-01-20 DIAGNOSIS — W0110XA Fall on same level from slipping, tripping and stumbling with subsequent striking against unspecified object, initial encounter: Secondary | ICD-10-CM | POA: Diagnosis not present

## 2015-01-20 DIAGNOSIS — S0532XD Ocular laceration without prolapse or loss of intraocular tissue, left eye, subsequent encounter: Secondary | ICD-10-CM | POA: Diagnosis not present

## 2015-01-20 DIAGNOSIS — S01112D Laceration without foreign body of left eyelid and periocular area, subsequent encounter: Secondary | ICD-10-CM | POA: Diagnosis not present

## 2015-01-20 DIAGNOSIS — S0592XA Unspecified injury of left eye and orbit, initial encounter: Secondary | ICD-10-CM

## 2015-01-20 DIAGNOSIS — S0530XA Ocular laceration without prolapse or loss of intraocular tissue, unspecified eye, initial encounter: Secondary | ICD-10-CM | POA: Diagnosis present

## 2015-01-20 DIAGNOSIS — S0520XD Ocular laceration and rupture with prolapse or loss of intraocular tissue, unspecified eye, subsequent encounter: Secondary | ICD-10-CM | POA: Diagnosis not present

## 2015-01-20 DIAGNOSIS — W228XXA Striking against or struck by other objects, initial encounter: Secondary | ICD-10-CM | POA: Diagnosis not present

## 2015-01-20 HISTORY — PX: RUPTURED GLOBE EXPLORATION AND REPAIR: SHX2366

## 2015-01-20 HISTORY — PX: FACIAL LACERATION REPAIR: SHX6589

## 2015-01-20 LAB — CBC WITH DIFFERENTIAL/PLATELET
BASOS ABS: 0 10*3/uL (ref 0.0–0.1)
Basophils Relative: 0 %
EOS ABS: 0.1 10*3/uL (ref 0.0–0.7)
EOS PCT: 1 %
HCT: 42.5 % (ref 36.0–46.0)
Hemoglobin: 13.9 g/dL (ref 12.0–15.0)
Lymphocytes Relative: 33 %
Lymphs Abs: 2.9 10*3/uL (ref 0.7–4.0)
MCH: 30.5 pg (ref 26.0–34.0)
MCHC: 32.7 g/dL (ref 30.0–36.0)
MCV: 93.4 fL (ref 78.0–100.0)
MONO ABS: 0.6 10*3/uL (ref 0.1–1.0)
Monocytes Relative: 6 %
Neutro Abs: 5.4 10*3/uL (ref 1.7–7.7)
Neutrophils Relative %: 60 %
PLATELETS: 263 10*3/uL (ref 150–400)
RBC: 4.55 MIL/uL (ref 3.87–5.11)
RDW: 13.3 % (ref 11.5–15.5)
WBC: 9 10*3/uL (ref 4.0–10.5)

## 2015-01-20 LAB — I-STAT CHEM 8, ED
BUN: 17 mg/dL (ref 6–20)
CALCIUM ION: 1.07 mmol/L — AB (ref 1.13–1.30)
CREATININE: 0.7 mg/dL (ref 0.44–1.00)
Chloride: 101 mmol/L (ref 101–111)
GLUCOSE: 179 mg/dL — AB (ref 65–99)
HCT: 45 % (ref 36.0–46.0)
HEMOGLOBIN: 15.3 g/dL — AB (ref 12.0–15.0)
Potassium: 2.8 mmol/L — ABNORMAL LOW (ref 3.5–5.1)
Sodium: 139 mmol/L (ref 135–145)
TCO2: 24 mmol/L (ref 0–100)

## 2015-01-20 SURGERY — REPAIR, RUPTURE, GLOBE
Anesthesia: General | Site: Face | Laterality: Left

## 2015-01-20 MED ORDER — HYDROMORPHONE HCL 1 MG/ML IJ SOLN
0.5000 mg | Freq: Once | INTRAMUSCULAR | Status: AC
  Administered 2015-01-20: 0.5 mg via INTRAVENOUS

## 2015-01-20 MED ORDER — DIPHENHYDRAMINE HCL 50 MG/ML IJ SOLN
25.0000 mg | Freq: Once | INTRAMUSCULAR | Status: AC
  Administered 2015-01-20: 25 mg via INTRAVENOUS
  Filled 2015-01-20: qty 1

## 2015-01-20 MED ORDER — TETANUS-DIPHTH-ACELL PERTUSSIS 5-2.5-18.5 LF-MCG/0.5 IM SUSP
0.5000 mL | Freq: Once | INTRAMUSCULAR | Status: AC
  Administered 2015-01-20: 0.5 mL via INTRAMUSCULAR
  Filled 2015-01-20: qty 0.5

## 2015-01-20 MED ORDER — PROPOFOL 10 MG/ML IV BOLUS
INTRAVENOUS | Status: AC
  Filled 2015-01-20: qty 20

## 2015-01-20 MED ORDER — METOCLOPRAMIDE HCL 5 MG/ML IJ SOLN
10.0000 mg | Freq: Once | INTRAMUSCULAR | Status: AC
  Administered 2015-01-20: 10 mg via INTRAVENOUS
  Filled 2015-01-20: qty 2

## 2015-01-20 MED ORDER — FENTANYL CITRATE (PF) 100 MCG/2ML IJ SOLN
50.0000 ug | Freq: Once | INTRAMUSCULAR | Status: AC
  Administered 2015-01-20: 50 ug via INTRAVENOUS
  Filled 2015-01-20: qty 2

## 2015-01-20 MED ORDER — BSS IO SOLN
INTRAOCULAR | Status: AC
  Filled 2015-01-20: qty 15

## 2015-01-20 MED ORDER — FENTANYL CITRATE (PF) 100 MCG/2ML IJ SOLN
100.0000 ug | Freq: Once | INTRAMUSCULAR | Status: AC
  Administered 2015-01-20: 50 ug via INTRAVENOUS
  Filled 2015-01-20: qty 2

## 2015-01-20 MED ORDER — SUFENTANIL CITRATE 50 MCG/ML IV SOLN
INTRAVENOUS | Status: AC
  Filled 2015-01-20: qty 1

## 2015-01-20 MED ORDER — POTASSIUM CHLORIDE 10 MEQ/100ML IV SOLN
10.0000 meq | Freq: Once | INTRAVENOUS | Status: DC
  Filled 2015-01-20 (×2): qty 100

## 2015-01-20 MED ORDER — ONDANSETRON HCL 4 MG/2ML IJ SOLN
4.0000 mg | Freq: Once | INTRAMUSCULAR | Status: AC
  Administered 2015-01-20: 4 mg via INTRAVENOUS
  Filled 2015-01-20: qty 2

## 2015-01-20 MED ORDER — CEFAZOLIN SODIUM-DEXTROSE 2-3 GM-% IV SOLR
2.0000 g | INTRAVENOUS | Status: AC
  Administered 2015-01-20: 2 g via INTRAVENOUS

## 2015-01-20 MED ORDER — HYDROMORPHONE HCL 1 MG/ML IJ SOLN
INTRAMUSCULAR | Status: AC
  Filled 2015-01-20: qty 1

## 2015-01-20 SURGICAL SUPPLY — 76 items
ACCESSORY FRAGMATOME (MISCELLANEOUS) IMPLANT
APPLICATOR DR MATTHEWS STRL (MISCELLANEOUS) ×4 IMPLANT
BAG URINE DRAINAGE (UROLOGICAL SUPPLIES) IMPLANT
BLADE 10 SAFETY STRL DISP (BLADE) ×4 IMPLANT
BLADE EYE CATARACT 19 1.4 BEAV (BLADE) IMPLANT
BLADE MVR KNIFE 19G (BLADE) ×4 IMPLANT
BLADE SURG 15 STRL LF DISP TIS (BLADE) IMPLANT
BLADE SURG 15 STRL SS (BLADE)
CANNULA ANT CHAM MAIN (OPHTHALMIC RELATED) IMPLANT
CANNULA SUBRETINAL FLUID 20G (BLADE) IMPLANT
CATH FOLEY 2WAY SLVR  5CC 16FR (CATHETERS)
CATH FOLEY 2WAY SLVR 5CC 16FR (CATHETERS) IMPLANT
CORDS BIPOLAR (ELECTRODE) ×4 IMPLANT
COTTONBALL LRG STERILE PKG (GAUZE/BANDAGES/DRESSINGS) ×12 IMPLANT
COUNTER NEEDLE 20 DBL MAG RED (NEEDLE) ×4 IMPLANT
COVER MAYO STAND STRL (DRAPES) IMPLANT
COVER SURGICAL LIGHT HANDLE (MISCELLANEOUS) ×4 IMPLANT
DRAPE OPHTHALMIC 77X100 STRL (CUSTOM PROCEDURE TRAY) ×4 IMPLANT
ERASER HMR WETFIELD 23G BP (MISCELLANEOUS) ×4 IMPLANT
FILTER BLUE MILLIPORE (MISCELLANEOUS) IMPLANT
GAS OPHTHALMIC (MISCELLANEOUS) IMPLANT
GLOVE BIO SURGEON STRL SZ 6.5 (GLOVE) ×3 IMPLANT
GLOVE BIO SURGEON STRL SZ7 (GLOVE) ×4 IMPLANT
GLOVE BIO SURGEONS STRL SZ 6.5 (GLOVE) ×1
GLOVE BIOGEL PI IND STRL 7.5 (GLOVE) ×2 IMPLANT
GLOVE BIOGEL PI INDICATOR 7.5 (GLOVE) ×2
GLOVE SS BIOGEL STRL SZ 6.5 (GLOVE) ×2 IMPLANT
GLOVE SUPERSENSE BIOGEL SZ 6.5 (GLOVE) ×2
GLOVE SURG SS PI 7.5 STRL IVOR (GLOVE) ×4 IMPLANT
GOWN STRL REUS W/ TWL LRG LVL3 (GOWN DISPOSABLE) ×4 IMPLANT
GOWN STRL REUS W/TWL LRG LVL3 (GOWN DISPOSABLE) ×4
KIT BASIN OR (CUSTOM PROCEDURE TRAY) ×4 IMPLANT
KIT PERFLUORON PROCEDURE 5ML (MISCELLANEOUS) IMPLANT
KIT ROOM TURNOVER OR (KITS) ×4 IMPLANT
KNIFE CRESCENT 1.75 EDGEAHEAD (BLADE) IMPLANT
KNIFE GRIESHABER SHARP 2.5MM (MISCELLANEOUS) IMPLANT
MARKER SKIN DUAL TIP RULER LAB (MISCELLANEOUS) ×4 IMPLANT
NEEDLE 18GX1X1/2 (RX/OR ONLY) (NEEDLE) ×4 IMPLANT
NEEDLE 25GX 5/8IN NON SAFETY (NEEDLE) ×4 IMPLANT
NEEDLE 27GAX1X1/2 (NEEDLE) IMPLANT
NEEDLE HYPO 30X.5 LL (NEEDLE) ×8 IMPLANT
NS IRRIG 1000ML POUR BTL (IV SOLUTION) ×4 IMPLANT
PACK VITRECTOMY CUSTOM (CUSTOM PROCEDURE TRAY) ×4 IMPLANT
PACK VITRECTOMY PIC MCHSVP (PACKS) IMPLANT
PAD ARMBOARD 7.5X6 YLW CONV (MISCELLANEOUS) ×8 IMPLANT
PROBE LASER 20G CVD ENDOCULAR (MISCELLANEOUS) ×4 IMPLANT
PROBE LASER LIGHT 20GA (MISCELLANEOUS) IMPLANT
REPL STRA BRUSH NEEDLE (NEEDLE) IMPLANT
RESERVOIR BACK FLUSH (MISCELLANEOUS) IMPLANT
ROLLS DENTAL (MISCELLANEOUS) ×8 IMPLANT
SCRAPER DIAMOND DUST MEMBRANE (MISCELLANEOUS) IMPLANT
SET VGFI TUBING 8065808002 (SET/KITS/TRAYS/PACK) IMPLANT
SPEAR EYE SURG WECK-CEL (MISCELLANEOUS) ×16 IMPLANT
STOPCOCK 4 WAY LG BORE MALE ST (IV SETS) IMPLANT
SUT CHROMIC 7 0 TG140 8 (SUTURE) IMPLANT
SUT ETHILON 10 0 BV100 4 (SUTURE) IMPLANT
SUT ETHILON 10 0 BV75 4 (SUTURE) IMPLANT
SUT ETHILON 10 0 CS140 6 (SUTURE) IMPLANT
SUT ETHILON 9 0 TG140 8 (SUTURE) ×8 IMPLANT
SUT PLAIN 6 0 TG1408 (SUTURE) ×4 IMPLANT
SUT SILK 2 0 (SUTURE)
SUT SILK 2-0 18XBRD TIE 12 (SUTURE) IMPLANT
SUT VICRYL 7 0 TG140 8 (SUTURE) ×4 IMPLANT
SWAB COLLECTION DEVICE MRSA (MISCELLANEOUS) IMPLANT
SYR 20CC LL (SYRINGE) ×4 IMPLANT
SYR 50ML SLIP (SYRINGE) IMPLANT
SYR 5ML LL (SYRINGE) IMPLANT
SYR BULB 3OZ (MISCELLANEOUS) IMPLANT
SYR TB 1ML LUER SLIP (SYRINGE) ×4 IMPLANT
SYRINGE 10CC LL (SYRINGE) IMPLANT
TOWEL OR 17X24 6PK STRL BLUE (TOWEL DISPOSABLE) ×12 IMPLANT
TUBE ANAEROBIC SPECIMEN COL (MISCELLANEOUS) IMPLANT
TUBING HIGH PRESS EXTEN 6IN (TUBING) IMPLANT
VITREORETINAL VISCODISSEC (MISCELLANEOUS) IMPLANT
WATER STERILE IRR 1000ML POUR (IV SOLUTION) ×4 IMPLANT
WIPE INSTRUMENT VISIWIPE 73X73 (MISCELLANEOUS) ×4 IMPLANT

## 2015-01-20 NOTE — Consult Note (Signed)
CC:  Chief Complaint  Patient presents with  . Fall  . Eye Injury    left    HPI: Molly [name] is a 82 y.o. female w/ POH of glaucoma OU, RD OU, CE/IOL OU who presents for evaluation of ruptured globe following ground level fall. Symptoms started this evening with fall with resulting left sided facial pain and decreased vision and left eye pain. Last PO ~7 PM. No new symptoms right eye. Last seen at [facility] on 12/30/14. Using multiple glaucoma drops.  ROS: See HPI - notable for left eye pain and left eye decreased vision  PMH: Past Medical History  Diagnosis Date  . Hyperlipidemia   . Diverticulosis of colon   . GERD (gastroesophageal reflux disease)   . Glaucoma   . Kidney stones   . Hypertension   . MALIGNANT NEOPLASM OF UTERUS PART UNSPECIFIED 1998  . Thyroid disease     hypothyroidism    PSH: Past Surgical History  Procedure Laterality Date  . Detached retina repair os    . Abdominal hysterectomy  12/22/1996    Uterine Cancer Dr Phineas Real  . Release r long finger a1 pulley  01/23/2008    Dr Daylene Katayama  . Breast biopsy  1996    Benign  . Cholecystectomy      Meds: No current facility-administered medications on file prior to encounter.   Current Outpatient Prescriptions on File Prior to Encounter  Medication Sig Dispense Refill  . aspirin 81 MG EC tablet Take 81 mg by mouth daily.      Marland Kitchen atenolol (TENORMIN) 25 MG tablet Take 1 tablet (25 mg total) by mouth daily. 90 tablet 3  . AZOPT 1 % ophthalmic suspension Place 1 drop into both eyes 2 (two) times daily.     . brimonidine (ALPHAGAN P) 0.1 % SOLN Apply 1 drop to eye 2 (two) times daily.    . Cholecalciferol ([employer] VITAMIN D) 2000 UNITS CAPS Take 1 capsule (2,000 Units total) by mouth daily.    . Coenzyme Q10 (COQ10 PO) Take 1 tablet by mouth daily.     . dorzolamide-timolol (COSOPT) 22.3-6.8 MG/ML ophthalmic solution Place 1 drop into both eyes 2 (two) times daily.    . hydrochlorothiazide (MICROZIDE) 12.5 MG  capsule Take 1 capsule by mouth daily 90 capsule 1  . latanoprost (XALATAN) 0.005 % ophthalmic solution Place 1 drop into both eyes at bedtime.    Marland Kitchen levothyroxine (SYNTHROID, LEVOTHROID) 88 MCG tablet Take 1 tablet (88 mcg total) by mouth daily. 90 tablet 3  . Multiple Vitamin (MULTIVITAMIN) tablet Take 1 tablet by mouth daily.      . Nutritional Supplements (ESTROVEN PO) Take 1 capsule by mouth daily.      SH: Social History   Social History  . Marital Status: Married    Spouse Name: Trilby Drummer  . Number of Children: 3  . Years of Education: N/A   Occupational History  . Realtor     Retired  . Bookkeeper      Retired    Social History Main Topics  . Smoking status: Never Smoker   . Smokeless tobacco: Never Used  . Alcohol Use: No  . Drug Use: No  . Sexual Activity: Not Asked   Other Topics Concern  . None   Social History Narrative   Married 1952   Retired Careers adviser    FH: Family History  Problem Relation Age of Onset  . Heart disease Father 21  MI  . Cancer Sister     Cervical Cancer  . Diabetes Neg Hx   . Breast cancer Neg Hx   . Colon cancer Maternal Aunt     Exam:  Lucianne Lei: OD: 4 pt cc  OS: LP  CVF: OD: full OS: LP  EOM: OD: full d/v OS: full d/v  Pupils: OD: 3->2 mm, no APD OS: no view, + APD  External: OD: no periorbital edema, no proptosis,  good orbicularis strength OS: no periorbital edema, no proptosis,  good orbicularis strength, 2.5 cm eyebrow laceration   IOP: OD soft / OS deferred  Pen Light Exam: L/L: OD: WNL OS: WNL  C/S: OD: white and quiet OS: 1+ injection, uveal tissues in appearance superiorly  K: OD: clear, no abnormal staining OS: grossly clear - limited view  A/C: OD: grossly deep and quiet appearing by pen light OS: total hyphema  L: OD: PCIOL OS: no view  DFE:  V: OD: clear OS: no view  N: OD: C/D 0.95, no disc edema OS: no view  M: OD: flat, no obvious macular pathology OS:  no view  V: OD: normal appearing vessels OS: no view  P: OD: retina flat 360, no obvious mass/RT/RD OS: no view  A/P:  1. Rupture Globe OS: - Appearance of uveal tissue prolapse superiorly - Update Tetanus - IV Abx given in ED - Plan for GA for repair of ruptured globe - Discussed with patient and husband that given pre-op vision of LP that poor vision prognosis especially w/ already sick eye w/ h/o glaucoma and RD - Discussed with patient and husband that this will be the first of multiple surgeries with today's goal of surgery to close the eye and stabilize and then address the remaining issues non emergently - Plan for Vigamox QID and Pred Forte QID in the left eye and PO Avelox 400 mg daily for 7 days  2. Eyebrow Laceration OS: - Will repair at time of globe repair  3. Facial Fractures OS: - Defer to maxillofacial - per phone conversation recommend defer treatment until globe repaired and more stable  Christopher T. Manuella Ghazi, [place] [phone]

## 2015-01-20 NOTE — ED Notes (Signed)
Placed eye shield on pt's left eye for protection

## 2015-01-20 NOTE — ED Notes (Signed)
Patient transported to CT 

## 2015-01-20 NOTE — ED Provider Notes (Signed)
[facility] to repair open globe. Dr. Manuella Ghazi and is in the room evaluating the patient. She is noted to be hypokalemic and intravenous potassium is ordered. Also, CT scan has demonstrated fractures which will night attention of maxillofacial trauma and consultation is placed. Dr. Manuella Ghazi is taking patient directly to the operating room. Case is discussed with Dr. Birdena Jubilee who is on-call for maxillofacial trauma who states that any operative procedure she needs to do will be on a delayed basis to allow the globe to heal.  Results for orders placed or performed during the hospital encounter of 01/20/15  CBC with Differential  Result Value Ref Range   WBC 9.0 4.0 - 10.5 K/uL   RBC 4.55 3.87 - 5.11 MIL/uL   Hemoglobin 13.9 12.0 - 15.0 g/dL   HCT 42.5 36.0 - 46.0 %   MCV 93.4 78.0 - 100.0 fL   MCH 30.5 26.0 - 34.0 pg   MCHC 32.7 30.0 - 36.0 g/dL   RDW 13.3 11.5 - 15.5 %   Platelets 263 150 - 400 K/uL   Neutrophils Relative % 60 %   Neutro Abs 5.4 1.7 - 7.7 K/uL   Lymphocytes Relative 33 %   Lymphs Abs 2.9 0.7 - 4.0 K/uL   Monocytes Relative 6 %   Monocytes Absolute 0.6 0.1 - 1.0 K/uL   Eosinophils Relative 1 %   Eosinophils Absolute 0.1 0.0 - 0.7 K/uL   Basophils Relative 0 %   Basophils Absolute 0.0 0.0 - 0.1 K/uL  I-stat Chem 8, ED  Result Value Ref Range   Sodium 139 135 - 145 mmol/L   Potassium 2.8 (L) 3.5 - 5.1 mmol/L   Chloride 101 101 - 111 mmol/L   BUN 17 6 - 20 mg/dL   Creatinine, Ser 0.70 0.44 - 1.00 mg/dL   Glucose, Bld 179 (H) 65 - 99 mg/dL   Calcium, Ion 1.07 (L) 1.13 - 1.30 mmol/L   TCO2 24 0 - 100 mmol/L   Hemoglobin 15.3 (H) 12.0 - 15.0 g/dL   HCT 45.0 36.0 - 46.0 %   Ct Maxillofacial Wo Cm  01/20/2015  CLINICAL DATA:  82 year old female with left thigh trauma. EXAM: CT MAXILLOFACIAL WITHOUT CONTRAST TECHNIQUE: Multidetector CT imaging of the maxillofacial structures was performed. Multiplanar CT image reconstructions were  also generated. A small metallic BB was placed on the right temple in order to reliably differentiate right from left. COMPARISON:  None. FINDINGS: There is a minimally depressed fracture of the left zygomatic arch. There are displaced fractures of the posterolateral wall of the left maxillary sinus. There is a mildly depressed fracture of the anterior wall of the left maxillary sinus. There is a mildly displaced fracture of the left orbital floor along the inferior orbital groove. There is a minimally displaced fracture of the lateral wall of the left orbit. A small fracture fragment noted abutting the lateral rectus muscle. Correlation with clinical exam is recommended to exclude ocular entrapment. There is fracture of the inferior orbital rim. No other acute fracture identified. The mandible and pterygoid plates are intact. There is high attenuating content within the left globe concerning for intra-ocular hemorrhage. There is mild enlargement of the left globe. Correlation with clinical exam recommended. There is scleral banding of the left globe. The retro-orbital fat are preserved. There is partial opacification of the maxillary sinuses bilaterally. High attenuating content within the left maxillary sinus compatible with hemosinus. The mastoid air cells are clear. No  acute hemorrhage identified within the visualized brain. IMPRESSION: Left facial and orbital fractures as described. High attenuating content within the left globe most compatible with intra-articular hematoma. Clinical correlation and ophthalmology consult is advised. These results were called by telephone at the time of interpretation on 01/20/2015 at 10:20 pm to Dr. Davonna Belling , who verbally acknowledged these results. Electronically Signed   By: Anner Crete M.D.   On: 01/20/2015 123456      Delora Fuel, MD [id number]

## 2015-01-20 NOTE — ED Notes (Signed)
Nurse drawing labs. 

## 2015-01-20 NOTE — ED Provider Notes (Addendum)
CSN: [id number]     Arrival date & time 01/20/15  2106 History   First MD Initiated Contact with Patient 01/20/15 2113     Chief Complaint  Patient presents with  . Fall  . Eye Injury    left     Level V caveat due to urgent need for intervention in acute pain. Patient is a 82 y.o. female presenting with fall and eye injury. The history is provided by the patient and the spouse.  Fall  Eye Injury   patient reportedly tripped and fell. She had her left eye on something. It is not known what. She can now not see out of left eye. There is severe pain. She's had previous detached retina surgery on that eye. She sees Dr. Kathrin Penner. Also, the side of face. No headache. No confusion. No neck chest back or abdominal pain. She is not on anticoagulation.  Past Medical History  Diagnosis Date  . Hyperlipidemia   . Diverticulosis of colon   . GERD (gastroesophageal reflux disease)   . Glaucoma   . Kidney stones   . Hypertension   . MALIGNANT NEOPLASM OF UTERUS PART UNSPECIFIED 1998  . Thyroid disease     hypothyroidism   Past Surgical History  Procedure Laterality Date  . Detached retina repair os    . Abdominal hysterectomy  12/22/1996    Uterine Cancer Dr Phineas Real  . Release r long finger a1 pulley  01/23/2008    Dr Daylene Katayama  . Breast biopsy  1996    Benign  . Cholecystectomy     Family History  Problem Relation Age of Onset  . Heart disease Father 37    MI  . Cancer Sister     Cervical Cancer  . Diabetes Neg Hx   . Breast cancer Neg Hx   . Colon cancer Maternal Aunt    Social History  Substance Use Topics  . Smoking status: Never Smoker   . Smokeless tobacco: Never Used  . Alcohol Use: No   OB History    Gravida Para Term Preterm AB TAB SAB Ectopic Multiple Living   0 0 0 0 0 0 0 0       Review of Systems  Unable to perform ROS: Acuity of condition      Allergies  Erythromycin; Ezetimibe-simvastatin; Macrodantin; Morphine; Nitrofurantoin; Procaine hcl;  Simvastatin; Tape; Adhesive; and Morphine and related  Home Medications   Prior to Admission medications   Medication Sig Start Date End Date Taking? Authorizing Provider  aspirin 81 MG EC tablet Take 81 mg by mouth daily.     Yes Historical Provider, MD  atenolol (TENORMIN) 25 MG tablet Take 1 tablet (25 mg total) by mouth daily. 11/23/14  Yes Tonia Ghent, MD  AZOPT 1 % ophthalmic suspension Place 1 drop into both eyes 2 (two) times daily.  10/27/12  Yes Historical Provider, MD  brimonidine (ALPHAGAN P) 0.1 % SOLN Apply 1 drop to eye 2 (two) times daily.   Yes Historical Provider, MD  Cholecalciferol ([employer] VITAMIN D) 2000 UNITS CAPS Take 1 capsule (2,000 Units total) by mouth daily. 11/23/14  Yes Tonia Ghent, MD  Coenzyme Q10 (COQ10 PO) Take 1 tablet by mouth daily.    Yes Historical Provider, MD  dorzolamide-timolol (COSOPT) 22.3-6.8 MG/ML ophthalmic solution Place 1 drop into both eyes 2 (two) times daily.   Yes Historical Provider, MD  hydrochlorothiazide (MICROZIDE) 12.5 MG capsule Take 1 capsule by mouth daily 01/20/15  Yes Elveria Rising  Damita Dunnings, MD  latanoprost (XALATAN) 0.005 % ophthalmic solution Place 1 drop into both eyes at bedtime.   Yes Historical Provider, MD  levothyroxine (SYNTHROID, LEVOTHROID) 88 MCG tablet Take 1 tablet (88 mcg total) by mouth daily. 11/23/14  Yes Tonia Ghent, MD  Multiple Vitamin (MULTIVITAMIN) tablet Take 1 tablet by mouth daily.     Yes Historical Provider, MD  Nutritional Supplements (ESTROVEN PO) Take 1 capsule by mouth daily.   Yes Historical Provider, MD   BP 158/96 mmHg  Pulse 64  Temp(Src) 98.2 F (36.8 C) (Oral)  Resp 32  Wt 139 lb (63.05 kg)  SpO2 98% Physical Exam  Constitutional: She appears well-developed.  HENT:  Laceration to left periorbital area. Approximately 1.5 cm.  Eyes:  Bloody tearing from left eye area. There is a hyphema occluding most of the anterior chamber. There is some  dark swelling just superior to the cornea  that could be isolated blood or disrupted sclera. Difficult exam due to patient's pain.  Cardiovascular: Normal rate.   Pulmonary/Chest: Effort normal.  Neurological: She is alert.  Skin: Skin is warm.    ED Course  Procedures (including critical care time) Labs Review Labs Reviewed  CBC WITH DIFFERENTIAL/PLATELET  I-STAT CHEM 8, ED    Imaging Review No results found. I have personally reviewed and evaluated these images and lab results as part of my medical decision-making.   EKG Interpretation None      MDM   Final diagnoses:  Hyphema, left  Eye injury, left, initial encounter    Patient with fall and hit left side of face. And will likely or, however there is no eye equipment at the [facility] [place] and the patient will be transferred to [facility]. I also discussed with Dr Scherrie Merritts, MD 01/20/15 2209  CT scan found orbital fracture. Discussed with the transfer service. May be slightly decreased eye movement but  somewhat difficult exam.  Davonna Belling, MD 01/20/15 2228

## 2015-01-20 NOTE — ED Notes (Signed)
Dilaudid dropping sats, placed on 2 liters via nasal cannula

## 2015-01-20 NOTE — ED Notes (Addendum)
Pt was walking down abut 3 steps and fell b/w step and car. Not sure if pt hit eye on anything. Pt has cuts and brusing to left eye. Pt c/o nausea and pain to left eye.

## 2015-01-20 NOTE — ED Notes (Signed)
Redressed IV and repositioned it to keep it from beeping on the pump

## 2015-01-21 ENCOUNTER — Encounter (HOSPITAL_COMMUNITY): Payer: Self-pay | Admitting: Certified Registered"

## 2015-01-21 ENCOUNTER — Emergency Department (HOSPITAL_COMMUNITY): Payer: PPO | Admitting: Certified Registered"

## 2015-01-21 DIAGNOSIS — H409 Unspecified glaucoma: Secondary | ICD-10-CM

## 2015-01-21 DIAGNOSIS — S0232XA Fracture of orbital floor, left side, initial encounter for closed fracture: Secondary | ICD-10-CM | POA: Diagnosis not present

## 2015-01-21 DIAGNOSIS — I10 Essential (primary) hypertension: Secondary | ICD-10-CM

## 2015-01-21 DIAGNOSIS — S01119A Laceration without foreign body of unspecified eyelid and periocular area, initial encounter: Secondary | ICD-10-CM | POA: Diagnosis present

## 2015-01-21 DIAGNOSIS — R110 Nausea: Secondary | ICD-10-CM | POA: Diagnosis not present

## 2015-01-21 DIAGNOSIS — E034 Atrophy of thyroid (acquired): Secondary | ICD-10-CM

## 2015-01-21 DIAGNOSIS — E038 Other specified hypothyroidism: Secondary | ICD-10-CM

## 2015-01-21 DIAGNOSIS — E876 Hypokalemia: Secondary | ICD-10-CM | POA: Diagnosis not present

## 2015-01-21 DIAGNOSIS — S01112D Laceration without foreign body of left eyelid and periocular area, subsequent encounter: Secondary | ICD-10-CM | POA: Diagnosis not present

## 2015-01-21 DIAGNOSIS — K5900 Constipation, unspecified: Secondary | ICD-10-CM | POA: Diagnosis not present

## 2015-01-21 DIAGNOSIS — Z961 Presence of intraocular lens: Secondary | ICD-10-CM | POA: Diagnosis not present

## 2015-01-21 DIAGNOSIS — S01112A Laceration without foreign body of left eyelid and periocular area, initial encounter: Secondary | ICD-10-CM | POA: Diagnosis not present

## 2015-01-21 DIAGNOSIS — Z8542 Personal history of malignant neoplasm of other parts of uterus: Secondary | ICD-10-CM | POA: Diagnosis not present

## 2015-01-21 DIAGNOSIS — S0532XD Ocular laceration without prolapse or loss of intraocular tissue, left eye, subsequent encounter: Secondary | ICD-10-CM | POA: Diagnosis not present

## 2015-01-21 DIAGNOSIS — E039 Hypothyroidism, unspecified: Secondary | ICD-10-CM | POA: Diagnosis not present

## 2015-01-21 DIAGNOSIS — S0240DA Maxillary fracture, left side, initial encounter for closed fracture: Secondary | ICD-10-CM | POA: Diagnosis not present

## 2015-01-21 DIAGNOSIS — W108XXA Fall (on) (from) other stairs and steps, initial encounter: Secondary | ICD-10-CM | POA: Diagnosis not present

## 2015-01-21 DIAGNOSIS — S0240FA Zygomatic fracture, left side, initial encounter for closed fracture: Secondary | ICD-10-CM | POA: Diagnosis not present

## 2015-01-21 DIAGNOSIS — Z7982 Long term (current) use of aspirin: Secondary | ICD-10-CM | POA: Diagnosis not present

## 2015-01-21 DIAGNOSIS — W228XXA Striking against or struck by other objects, initial encounter: Secondary | ICD-10-CM | POA: Diagnosis not present

## 2015-01-21 DIAGNOSIS — K219 Gastro-esophageal reflux disease without esophagitis: Secondary | ICD-10-CM | POA: Diagnosis not present

## 2015-01-21 DIAGNOSIS — Y92009 Unspecified place in unspecified non-institutional (private) residence as the place of occurrence of the external cause: Secondary | ICD-10-CM | POA: Diagnosis not present

## 2015-01-21 DIAGNOSIS — Z79899 Other long term (current) drug therapy: Secondary | ICD-10-CM | POA: Diagnosis not present

## 2015-01-21 DIAGNOSIS — F0781 Postconcussional syndrome: Secondary | ICD-10-CM | POA: Diagnosis not present

## 2015-01-21 DIAGNOSIS — R531 Weakness: Secondary | ICD-10-CM | POA: Diagnosis present

## 2015-01-21 DIAGNOSIS — S0532XA Ocular laceration without prolapse or loss of intraocular tissue, left eye, initial encounter: Principal | ICD-10-CM

## 2015-01-21 DIAGNOSIS — W0110XA Fall on same level from slipping, tripping and stumbling with subsequent striking against unspecified object, initial encounter: Secondary | ICD-10-CM | POA: Diagnosis not present

## 2015-01-21 DIAGNOSIS — S0530XA Ocular laceration without prolapse or loss of intraocular tissue, unspecified eye, initial encounter: Secondary | ICD-10-CM | POA: Diagnosis present

## 2015-01-21 DIAGNOSIS — E785 Hyperlipidemia, unspecified: Secondary | ICD-10-CM | POA: Diagnosis not present

## 2015-01-21 DIAGNOSIS — R51 Headache: Secondary | ICD-10-CM | POA: Diagnosis not present

## 2015-01-21 LAB — BASIC METABOLIC PANEL
Anion gap: 9 (ref 5–15)
BUN: 7 mg/dL (ref 6–20)
CALCIUM: 8.7 mg/dL — AB (ref 8.9–10.3)
CO2: 27 mmol/L (ref 22–32)
CREATININE: 0.81 mg/dL (ref 0.44–1.00)
Chloride: 100 mmol/L — ABNORMAL LOW (ref 101–111)
GFR calc non Af Amer: >60 mL/min (ref >60)
Glucose, Bld: 185 mg/dL — ABNORMAL HIGH (ref 65–99)
Potassium: 4.7 mmol/L (ref 3.5–5.1)
SODIUM: 136 mmol/L (ref 135–145)

## 2015-01-21 LAB — MAGNESIUM: MAGNESIUM: 1.9 mg/dL (ref 1.7–2.4)

## 2015-01-21 MED ORDER — ACETAMINOPHEN 160 MG/5ML PO SOLN
325.0000 mg | ORAL | Status: DC | PRN
  Filled 2015-01-21: qty 20.3

## 2015-01-21 MED ORDER — ACETAZOLAMIDE SODIUM 500 MG IJ SOLR
INTRAMUSCULAR | Status: AC
  Filled 2015-01-21: qty 500

## 2015-01-21 MED ORDER — NEOSTIGMINE METHYLSULFATE 10 MG/10ML IV SOLN
INTRAVENOUS | Status: AC
  Filled 2015-01-21: qty 1

## 2015-01-21 MED ORDER — LIDOCAINE HCL (CARDIAC) 20 MG/ML IV SOLN
INTRAVENOUS | Status: AC
  Filled 2015-01-21: qty 5

## 2015-01-21 MED ORDER — POTASSIUM CHLORIDE 10 MEQ/100ML IV SOLN
INTRAVENOUS | Status: DC | PRN
  Administered 2015-01-21 (×2): 10 meq via INTRAVENOUS

## 2015-01-21 MED ORDER — FLUORESCEIN SODIUM 1 MG OP STRP
ORAL_STRIP | OPHTHALMIC | Status: DC | PRN
  Administered 2015-01-21: 2 via OPHTHALMIC

## 2015-01-21 MED ORDER — ONDANSETRON HCL 4 MG/2ML IJ SOLN
INTRAMUSCULAR | Status: DC | PRN
  Administered 2015-01-21: 4 mg via INTRAVENOUS

## 2015-01-21 MED ORDER — BSS IO SOLN
INTRAOCULAR | Status: DC | PRN
  Administered 2015-01-21: 15 mL via INTRAOCULAR

## 2015-01-21 MED ORDER — SUCCINYLCHOLINE CHLORIDE 20 MG/ML IJ SOLN
INTRAMUSCULAR | Status: AC
  Filled 2015-01-21: qty 1

## 2015-01-21 MED ORDER — SODIUM CHLORIDE 0.9 % IJ SOLN
3.0000 mL | Freq: Two times a day (BID) | INTRAMUSCULAR | Status: DC
  Administered 2015-01-21: 3 mL via INTRAVENOUS

## 2015-01-21 MED ORDER — HYDROCHLOROTHIAZIDE 12.5 MG PO CAPS: 12.5000 mg | ORAL_CAPSULE | Freq: Every day | ORAL | Status: DC

## 2015-01-21 MED ORDER — ATENOLOL 50 MG PO TABS
25.0000 mg | ORAL_TABLET | Freq: Every day | ORAL | Status: DC
  Administered 2015-01-21 – 2015-01-22 (×2): 25 mg via ORAL
  Filled 2015-01-21 (×2): qty 1

## 2015-01-21 MED ORDER — DEXAMETHASONE SODIUM PHOSPHATE 4 MG/ML IJ SOLN
INTRAMUSCULAR | Status: DC | PRN
  Administered 2015-01-21: 4 mg via INTRAVENOUS

## 2015-01-21 MED ORDER — GLYCOPYRROLATE 0.2 MG/ML IJ SOLN
INTRAMUSCULAR | Status: DC | PRN
  Administered 2015-01-21: 0.4 mg via INTRAVENOUS

## 2015-01-21 MED ORDER — EPINEPHRINE HCL 0.1 MG/ML IJ SOSY
PREFILLED_SYRINGE | INTRAMUSCULAR | Status: AC
  Filled 2015-01-21: qty 10

## 2015-01-21 MED ORDER — LEVOFLOXACIN 500 MG PO TABS
750.0000 mg | ORAL_TABLET | Freq: Every day | ORAL | Status: DC
Start: 2015-01-21 — End: 2015-01-22
  Administered 2015-01-21 – 2015-01-22 (×2): 750 mg via ORAL
  Filled 2015-01-21 (×2): qty 2

## 2015-01-21 MED ORDER — LATANOPROST 0.005 % OP SOLN
1.0000 [drp] | Freq: Every day | OPHTHALMIC | Status: DC
  Administered 2015-01-21: 1 [drp] via OPHTHALMIC
  Filled 2015-01-21: qty 2.5

## 2015-01-21 MED ORDER — LACTATED RINGERS IV SOLN
INTRAVENOUS | Status: DC | PRN
  Administered 2015-01-21: via INTRAVENOUS

## 2015-01-21 MED ORDER — PROPOFOL 10 MG/ML IV BOLUS
INTRAVENOUS | Status: DC | PRN
  Administered 2015-01-21: 120 mg via INTRAVENOUS

## 2015-01-21 MED ORDER — TOBRAMYCIN 0.3 % OP OINT
TOPICAL_OINTMENT | OPHTHALMIC | Status: DC | PRN
  Administered 2015-01-21: 1 via OPHTHALMIC

## 2015-01-21 MED ORDER — SODIUM CHLORIDE 0.9 % IJ SOLN
INTRAMUSCULAR | Status: AC
  Filled 2015-01-21: qty 10

## 2015-01-21 MED ORDER — TOBRAMYCIN-DEXAMETHASONE 0.3-0.1 % OP OINT
TOPICAL_OINTMENT | OPHTHALMIC | Status: AC
  Filled 2015-01-21: qty 3.5

## 2015-01-21 MED ORDER — ACETAMINOPHEN 325 MG PO TABS: 325.0000 mg | ORAL_TABLET | ORAL | Status: DC | PRN

## 2015-01-21 MED ORDER — SODIUM HYALURONATE 10 MG/ML IO SOLN
INTRAOCULAR | Status: AC
  Filled 2015-01-21: qty 0.85

## 2015-01-21 MED ORDER — ROCURONIUM BROMIDE 50 MG/5ML IV SOLN
INTRAVENOUS | Status: AC
  Filled 2015-01-21: qty 1

## 2015-01-21 MED ORDER — HYPROMELLOSE (GONIOSCOPIC) 2.5 % OP SOLN
OPHTHALMIC | Status: AC
  Filled 2015-01-21: qty 15

## 2015-01-21 MED ORDER — POTASSIUM CHLORIDE 10 MEQ/100ML IV SOLN: INTRAVENOUS | Status: DC | PRN

## 2015-01-21 MED ORDER — DORZOLAMIDE HCL-TIMOLOL MAL 2-0.5 % OP SOLN
1.0000 [drp] | Freq: Two times a day (BID) | OPHTHALMIC | Status: DC
  Administered 2015-01-21 – 2015-01-22 (×2): 1 [drp] via OPHTHALMIC
  Filled 2015-01-21: qty 10

## 2015-01-21 MED ORDER — ACETAMINOPHEN 325 MG PO TABS
650.0000 mg | ORAL_TABLET | Freq: Four times a day (QID) | ORAL | Status: DC | PRN
  Administered 2015-01-21: 650 mg via ORAL
  Filled 2015-01-21: qty 2

## 2015-01-21 MED ORDER — BSS PLUS IO SOLN: INTRAOCULAR | Status: DC | PRN

## 2015-01-21 MED ORDER — FENTANYL CITRATE (PF) 100 MCG/2ML IJ SOLN
INTRAMUSCULAR | Status: AC
  Filled 2015-01-21: qty 2

## 2015-01-21 MED ORDER — SUFENTANIL CITRATE 50 MCG/ML IV SOLN
INTRAVENOUS | Status: DC | PRN
  Administered 2015-01-21: 5 ug via INTRAVENOUS

## 2015-01-21 MED ORDER — KCL IN DEXTROSE-NACL 40-5-0.9 MEQ/L-%-% IV SOLN
INTRAVENOUS | Status: DC
  Administered 2015-01-21: 05:00:00 via INTRAVENOUS
  Filled 2015-01-21 (×3): qty 1000

## 2015-01-21 MED ORDER — NAPROXEN SODIUM 550 MG PO TABS
550.0000 mg | ORAL_TABLET | Freq: Two times a day (BID) | ORAL | Status: DC
  Administered 2015-01-21 (×2): 550 mg via ORAL
  Filled 2015-01-21 (×5): qty 1

## 2015-01-21 MED ORDER — ONDANSETRON HCL 4 MG/2ML IJ SOLN
INTRAMUSCULAR | Status: AC
  Filled 2015-01-21: qty 2

## 2015-01-21 MED ORDER — FENTANYL CITRATE (PF) 100 MCG/2ML IJ SOLN
12.5000 ug | INTRAMUSCULAR | Status: DC | PRN
Start: 2015-01-21 — End: 2015-01-22
  Administered 2015-01-21 (×2): 12.5 ug via INTRAVENOUS
  Filled 2015-01-21 (×2): qty 2

## 2015-01-21 MED ORDER — POTASSIUM CHLORIDE CRYS ER 20 MEQ PO TBCR
20.0000 meq | EXTENDED_RELEASE_TABLET | Freq: Two times a day (BID) | ORAL | Status: DC
  Administered 2015-01-21: 20 meq via ORAL
  Filled 2015-01-21: qty 1

## 2015-01-21 MED ORDER — FLUORESCEIN SODIUM 1 MG OP STRP
ORAL_STRIP | OPHTHALMIC | Status: AC
  Filled 2015-01-21: qty 2

## 2015-01-21 MED ORDER — FENTANYL CITRATE (PF) 100 MCG/2ML IJ SOLN
25.0000 ug | INTRAMUSCULAR | Status: DC | PRN
  Administered 2015-01-21: 25 ug via INTRAVENOUS

## 2015-01-21 MED ORDER — PREDNISOLONE ACETATE 1 % OP SUSP
OPHTHALMIC | Status: DC | PRN
  Administered 2015-01-21: 1 [drp] via OPHTHALMIC

## 2015-01-21 MED ORDER — GLYCOPYRROLATE 0.2 MG/ML IJ SOLN
INTRAMUSCULAR | Status: AC
  Filled 2015-01-21: qty 1

## 2015-01-21 MED ORDER — LIDOCAINE HCL (CARDIAC) 20 MG/ML IV SOLN
INTRAVENOUS | Status: DC | PRN
  Administered 2015-01-21: 60 mg via INTRAVENOUS

## 2015-01-21 MED ORDER — GATIFLOXACIN 0.5 % OP SOLN
OPHTHALMIC | Status: DC | PRN
  Administered 2015-01-21: 1 [drp] via OPHTHALMIC

## 2015-01-21 MED ORDER — ONDANSETRON HCL 4 MG/2ML IJ SOLN: 4.0000 mg | Freq: Four times a day (QID) | INTRAMUSCULAR | Status: DC | PRN

## 2015-01-21 MED ORDER — LEVOTHYROXINE SODIUM 88 MCG PO TABS
88.0000 ug | ORAL_TABLET | Freq: Every day | ORAL | Status: DC
Start: 2015-01-21 — End: 2015-01-22
  Administered 2015-01-21 – 2015-01-22 (×2): 88 ug via ORAL
  Filled 2015-01-21 (×3): qty 1

## 2015-01-21 MED ORDER — GATIFLOXACIN 0.5 % OP SOLN
1.0000 [drp] | Freq: Once | OPHTHALMIC | Status: DC
  Filled 2015-01-21: qty 2.5

## 2015-01-21 MED ORDER — BRIMONIDINE TARTRATE 0.2 % OP SOLN
1.0000 [drp] | Freq: Three times a day (TID) | OPHTHALMIC | Status: DC
  Administered 2015-01-21 – 2015-01-22 (×3): 1 [drp] via OPHTHALMIC
  Filled 2015-01-21: qty 5

## 2015-01-21 MED ORDER — PREDNISOLONE ACETATE 1 % OP SUSP
1.0000 [drp] | Freq: Once | OPHTHALMIC | Status: DC
  Filled 2015-01-21: qty 1

## 2015-01-21 MED ORDER — ACETAMINOPHEN 650 MG RE SUPP: 650.0000 mg | Freq: Four times a day (QID) | RECTAL | Status: DC | PRN

## 2015-01-21 MED ORDER — NEOSTIGMINE METHYLSULFATE 10 MG/10ML IV SOLN
INTRAVENOUS | Status: DC | PRN
  Administered 2015-01-21: 3 mg via INTRAVENOUS

## 2015-01-21 MED ORDER — ONDANSETRON HCL 4 MG PO TABS: 4.0000 mg | ORAL_TABLET | Freq: Four times a day (QID) | ORAL | Status: DC | PRN

## 2015-01-21 MED ORDER — ATROPINE SULFATE 1 % OP SOLN
OPHTHALMIC | Status: DC | PRN
  Administered 2015-01-21: 2 [drp] via OPHTHALMIC

## 2015-01-21 MED ORDER — SUCCINYLCHOLINE CHLORIDE 20 MG/ML IJ SOLN
INTRAMUSCULAR | Status: DC | PRN
  Administered 2015-01-21: 40 mg via INTRAVENOUS

## 2015-01-21 MED ORDER — ATROPINE SULFATE 1 % OP SOLN
OPHTHALMIC | Status: AC
  Filled 2015-01-21: qty 5

## 2015-01-21 MED ORDER — ROCURONIUM BROMIDE 100 MG/10ML IV SOLN
INTRAVENOUS | Status: DC | PRN
  Administered 2015-01-21: 30 mg via INTRAVENOUS

## 2015-01-21 MED ORDER — DEXAMETHASONE SODIUM PHOSPHATE 4 MG/ML IJ SOLN
INTRAMUSCULAR | Status: AC
  Filled 2015-01-21: qty 1

## 2015-01-21 NOTE — Progress Notes (Signed)
POST OP CARE:  Please keep patch on until follow-up  - Follow-up at [facility] @ 2 PM - If still admitted will see inpatient after clinic ~5 PM  Post op medications: - No need for drops or ointment necessary while patch is on - Please start PO Avelox 400 mg daily for 7 days to start 01/21/15 AM  Contact: Please call [phone] w/ any questions or concerns regarding her eye  Odetta Forness T. Manuella Ghazi, MD Ophthalmologist

## 2015-01-21 NOTE — Evaluation (Addendum)
Physical Therapy Evaluation Patient Details Name: Molly [name] MRN: [id number] DOB: 24-Mar-1929 Today's Date: 01/21/2015   History of Present Illness  82 y.o. female w/ POH of glaucoma OU, RD OU, CE/IOL OU who presents for evaluation of ruptured globe following ground level fall  Clinical Impression  Pt admitted as above diagnosis. Pt currently with functional limitations due to the deficits listed below (see PT Problem List). Pt will benefit from skilled PT to increase their independence and safety with mobility to allow discharge to the venue listed below.  Patient able to ambulate 150 feet with rw and min guard/supervision. Based upon the patient's current mobility, anticipate patient will D/C to home with spouse assistance. PT to continue to follow to progress ambulation and mobility as tolerated.      Follow Up Recommendations Home health PT;Supervision for mobility/OOB    Equipment Recommendations  Rolling walker with 5" wheels    Recommendations for Other Services       Precautions / Restrictions Precautions Precautions: Fall Restrictions Weight Bearing Restrictions: No      Mobility  Bed Mobility Overal bed mobility: Needs Assistance Bed Mobility: Supine to Sit     Supine to sit: Supervision     General bed mobility comments: supervision for safety  Transfers Overall transfer level: Needs assistance Equipment used: Rolling walker (2 wheeled) Transfers: Sit to/from Stand Sit to Stand: Min guard         General transfer comment: supervision for safety  Ambulation/Gait Ambulation/Gait assistance: Supervision;Min guard Ambulation Distance (Feet): 150 Feet Assistive device: Rolling walker (2 wheeled) Gait Pattern/deviations: Step-through pattern Gait velocity: decreased   General Gait Details: even strides, no loss of balance while using rw. Occasional difficulty navigating due to visual difficulty  Stairs            Wheelchair Mobility     Modified Rankin (Stroke Patients Only)       Balance Overall balance assessment: Needs assistance Sitting-balance support: No upper extremity supported Sitting balance-Leahy Scale: Good     Standing balance support: No upper extremity supported Standing balance-Leahy Scale: Fair Standing balance comment: static                             Pertinent Vitals/Pain Pain Assessment: 0-10 Pain Score: 6  Pain Location: head Pain Descriptors / Indicators: Other (Comment) (headache) Pain Intervention(s): Limited activity within patient's tolerance;Monitored during session    [place] expects to be discharged to:: Private residence Living Arrangements: Spouse/significant other Available Help at Discharge: Family;Available 24 hours/day Type of Home: House Home Access: Stairs to enter Entrance Stairs-Rails: Left Entrance Stairs-Number of Steps: 3 Home Layout: Able to live on main level with bedroom/bathroom Home Equipment: None      Prior Function Level of Independence: Independent               Hand Dominance        Extremity/Trunk Assessment               Lower Extremity Assessment: Generalized weakness         Communication   Communication: No difficulties  Cognition Arousal/Alertness: Awake/alert Behavior During Therapy: WFL for tasks assessed/performed Overall Cognitive Status: Within Functional Limits for tasks assessed                      General Comments      Exercises        Assessment/Plan  PT Assessment Patient needs continued PT services  PT Diagnosis Difficulty walking   PT Problem List Decreased strength;Decreased activity tolerance;Decreased balance;Decreased mobility;Decreased knowledge of use of DME  PT Treatment Interventions DME instruction;Gait training;Stair training;Functional mobility training;Therapeutic activities;Therapeutic exercise;Patient/family education   PT Goals (Current  goals can be found in the Care Plan section) Acute Rehab PT Goals Patient Stated Goal: go back home from the hospital PT Goal Formulation: With patient Time For Goal Achievement: 02/04/15 Potential to Achieve Goals: Good    Frequency Min 3X/week   Barriers to discharge        Co-evaluation               End of Session Equipment Utilized During Treatment: Gait belt Activity Tolerance: Patient tolerated treatment well Patient left: in chair;with call bell/phone within reach Nurse Communication: Mobility status    Functional Assessment Tool Used: clinical judgment Functional Limitation: Mobility: Walking and moving around Mobility: Walking and Moving Around Current Status [phone]): At least 20 percent but less than 40 percent impaired, limited or restricted Mobility: Walking and Moving Around Goal Status [phone]): At least 1 percent but less than 20 percent impaired, limited or restricted    Time: 1147-1216 PT Time Calculation (min) (ACUTE ONLY): 29 min   Charges:   PT Evaluation $PT Eval Moderate Complexity: 1 Procedure PT Treatments $Gait Training: 8-22 mins   PT G Codes:   PT G-Codes **NOT FOR INPATIENT CLASS** Functional Assessment Tool Used: clinical judgment Functional Limitation: Mobility: Walking and moving around Mobility: Walking and Moving Around Current Status [phone]): At least 20 percent but less than 40 percent impaired, limited or restricted Mobility: Walking and Moving Around Goal Status [phone]): At least 1 percent but less than 20 percent impaired, limited or restricted    Cassell Clement, PT, [place] Pager [phone] Office [phone]  01/21/2015, 12:56 PM

## 2015-01-21 NOTE — Evaluation (Signed)
Occupational Therapy Evaluation Patient Details Name: Molly [name] MRN: [id number] DOB: 1929-11-27 Today's Date: 01/21/2015    History of Present Illness 82 y.o. female w/ POH of glaucoma OU, RD OU, CE/IOL OU who presents for evaluation of ruptured globe following ground level fall   Clinical Impression   Pt reports she was independent with ADLs and mobility PTA. Currently pt is overall min guard for safety with ADLs and functional mobility. All safety and ADL education complete; pt and husband with no further questions or concerns for OT at this time. Pt planning to d/c home with 24/7 supervision from her husband. Pt ready to d/c from OT standpoint; signing off at this time. Thank you for this referral.     Follow Up Recommendations  No OT follow up;Supervision/Assistance - 24 hour    Equipment Recommendations  3 in 1 bedside comode    Recommendations for Other Services       Precautions / Restrictions Precautions Precautions: Fall Restrictions Weight Bearing Restrictions: No      Mobility Bed Mobility Overal bed mobility: Needs Assistance Bed Mobility: Supine to Sit;Sit to Supine     Supine to sit: Supervision;HOB elevated Sit to supine: Supervision;HOB elevated   General bed mobility comments: Supervision for safety, no assist required.  Transfers Overall transfer level: Needs assistance Equipment used: Rolling walker (2 wheeled) Transfers: Sit to/from Stand Sit to Stand: Min guard         General transfer comment: Min guard for safety; no physical assist required. VCs for hand placement and safety with RW.    Balance Overall balance assessment: Needs assistance Sitting-balance support: No upper extremity supported;Feet supported Sitting balance-Leahy Scale: Good     Standing balance support: No upper extremity supported;During functional activity Standing balance-Leahy Scale: Fair Standing balance comment: Able to stand and complete ADLs with no UE  support.                            ADL Overall ADL's : Needs assistance/impaired Eating/Feeding: Set up;Sitting   Grooming: Min guard;Wash/dry hands;Standing       Lower Body Bathing: Min guard;Sit to/from stand Lower Body Bathing Details (indicate cue type and reason): Pt able to perform LB bathing following incontinent episode with min guard for sit to stand.     Lower Body Dressing: Min guard;Sit to/from stand Lower Body Dressing Details (indicate cue type and reason): Pt able to don bilateral socks. Doff/don underwear. Min guard for sit to stand. Toilet Transfer: Min guard;Ambulation;Comfort height toilet;Grab bars;RW Armed forces technical officer Details (indicate cue type and reason): Educated pt and husband on use of 3 in 1 over toilet for increased safety with transfers. Toileting- Water quality scientist and Hygiene: Min guard;Sitting/lateral lean;Sit to/from stand       Functional mobility during ADLs: Min guard;Rolling walker General ADL Comments: Pts husband present for OT eval. Educated on home safety, need for close supervision during ADLs and functional mobility upon return home; pt and husband verbalize understanding. Pt incontinent of bladder with sit to stand.     Vision     Perception     Praxis      Pertinent Vitals/Pain Pain Assessment: 0-10 Pain Score: 7  Pain Location: head Pain Descriptors / Indicators: Headache Pain Intervention(s): Limited activity within patient's tolerance;Monitored during session;Patient requesting pain meds-RN notified     Hand Dominance     Extremity/Trunk Assessment Upper Extremity Assessment Upper Extremity Assessment: Generalized weakness   Lower Extremity  Assessment Lower Extremity Assessment: Defer to PT evaluation   Cervical / Trunk Assessment Cervical / Trunk Assessment: Normal   Communication Communication Communication: No difficulties   Cognition Arousal/Alertness: Awake/alert Behavior During Therapy: WFL  for tasks assessed/performed Overall Cognitive Status: Within Functional Limits for tasks assessed                     General Comments       Exercises       Shoulder Instructions      Home Living Family/patient expects to be discharged to:: Private residence Living Arrangements: Spouse/significant other Available Help at Discharge: Family;Available 24 hours/day Type of Home: House Home Access: Stairs to enter [employer] of Steps: 3 Entrance Stairs-Rails: Left Home Layout: Two level;Able to live on main level with bedroom/bathroom     Bathroom Shower/Tub: Occupational psychologist: Handicapped height Bathroom Accessibility: Yes How Accessible: Accessible via walker Home Equipment: Shower seat - built in          Prior Functioning/Environment Level of Independence: Independent             OT Diagnosis: Generalized weakness;Acute pain;Disturbance of vision   OT Problem List:     OT Treatment/Interventions:      OT Goals(Current goals can be found in the care plan section) Acute Rehab OT Goals Patient Stated Goal: to go home tomorrow OT Goal Formulation: All assessment and education complete, DC therapy  OT Frequency:     Barriers to D/C:            Co-evaluation              End of Session Equipment Utilized During Treatment: Gait belt;Rolling walker Nurse Communication: Patient requests pain meds  Activity Tolerance: Patient tolerated treatment well Patient left: in bed;with call bell/phone within reach;with family/visitor present   Time: 1611-1640 OT Time Calculation (min): 29 min Charges:  OT General Charges $OT Visit: 1 Procedure OT Evaluation $OT Eval Moderate Complexity: 1 Procedure OT Treatments $Self Care/Home Management : 8-22 mins G-Codes:     Binnie Kand M.S., OTR/L Pager: [phone]  01/21/2015, 4:48 PM

## 2015-01-21 NOTE — H&P (Signed)
History and Physical  Patient Name: Molly [name]     [id number]    DOB: August 12, 1929    DOA: 01/20/2015 Referring physician: Danice Goltz, MD PCP: Elsie Stain, MD      Chief Complaint: Hypokalemia  HPI: Molly [name] is a 82 y.o. female with a past medical history significant for HTN and hypothyroidism who presents with ruptured globe.   All history is collected from the husband because the patient at this time is still sedated from anesthesia. Tonight after supper the patient was going to the garage when she had a mechanical fall, tripped and hit her head probably on the car. She had immediate severe pain in the left face around the eye and bleeding from the eye.  There was no LOC, and the husband is unaware of any preceding symptoms. He drove her to the emergency room where a CT of the head showed maxillary fracture and possible ruptured globe.  She was taken to the operating room by Dr. Manuella Ghazi, who repaired left ruptured globe and left eyelid laceration.  Skull fracture was discussed with maxillofacial surgery, by phone who recommended nonemergent, staged surgical repair of the skull fracture after healing of the lobe.   TRH were asked to admit the patient for observation after she was incidentally noted to have hypokalemia, and unable to safely return home after surgery because of sedation from anesthesia.     Review of Systems:  Unable to obtain due to patient mentation.  Allergies  Allergen Reactions  . Erythromycin     Intolerant  . Ezetimibe-Simvastatin     REACTION: MUSCLE ACHES AND PAINS.   Clancy Gourd [Nitrofurantoin Macrocrystal] Nausea Only  . Morphine     Chest pain  . Nitrofurantoin     Unrecalled reaction  . Procaine Hcl     REACTION: UNSPECIFIED  . Simvastatin     myalgias  . Tape     Rash  . Adhesive [Tape] Rash  . Morphine And Related Palpitations    Prior to Admission medications   Medication Sig Start Date End Date Taking?  Authorizing Provider  aspirin 81 MG EC tablet Take 81 mg by mouth daily.     Yes Historical Provider, MD  atenolol (TENORMIN) 25 MG tablet Take 1 tablet (25 mg total) by mouth daily. 11/23/14  Yes Tonia Ghent, MD  AZOPT 1 % ophthalmic suspension Place 1 drop into both eyes 2 (two) times daily.  10/27/12  Yes Historical Provider, MD  brimonidine (ALPHAGAN P) 0.1 % SOLN Apply 1 drop to eye 2 (two) times daily.   Yes Historical Provider, MD  Cholecalciferol ([employer]S VITAMIN D) 2000 UNITS CAPS Take 1 capsule (2,000 Units total) by mouth daily. 11/23/14  Yes Tonia Ghent, MD  Coenzyme Q10 (COQ10 PO) Take 1 tablet by mouth daily.    Yes Historical Provider, MD  dorzolamide-timolol (COSOPT) 22.3-6.8 MG/ML ophthalmic solution Place 1 drop into both eyes 2 (two) times daily.   Yes Historical Provider, MD  hydrochlorothiazide (MICROZIDE) 12.5 MG capsule Take 1 capsule by mouth daily 01/20/15  Yes Tonia Ghent, MD  latanoprost (XALATAN) 0.005 % ophthalmic solution Place 1 drop into both eyes at bedtime.   Yes Historical Provider, MD  levothyroxine (SYNTHROID, LEVOTHROID) 88 MCG tablet Take 1 tablet (88 mcg total) by mouth daily. 11/23/14  Yes Tonia Ghent, MD  Multiple Vitamin (MULTIVITAMIN) tablet Take 1 tablet by mouth daily.     Yes Historical Provider, MD  Nutritional Supplements (ESTROVEN  PO) Take 1 capsule by mouth daily.   Yes Historical Provider, MD    Past Medical History  Diagnosis Date  . Hyperlipidemia   . Diverticulosis of colon   . GERD (gastroesophageal reflux disease)   . Glaucoma   . Kidney stones   . Hypertension   . MALIGNANT NEOPLASM OF UTERUS PART UNSPECIFIED 1998  . Thyroid disease     hypothyroidism    Past Surgical History  Procedure Laterality Date  . Detached retina repair os    . Abdominal hysterectomy  12/22/1996    Uterine Cancer Dr Phineas Real  . Release r long finger a1 pulley  01/23/2008    Dr Daylene Katayama  . Breast biopsy  1996    Benign  . Cholecystectomy        Family history: family history includes Cancer in her sister; Colon cancer in her maternal aunt; Heart disease (age of onset: 75) in her father. There is no history of Diabetes or Breast cancer.  Social History: Patient lives with her husband. She is not a smoker. She does not use alcohol. She is from [place] originally.       Physical Exam: BP 170/79 mmHg  Pulse 68  Temp(Src) 97.2 F (36.2 C) (Oral)  Resp 20  Wt 63.05 kg (139 lb)  SpO2 98% General appearance: Well-developed, elderly adult female, sedated and sleeping, occasionally snoring, in no apparent distress.   Eyes: Patch over left eye.     ENT: No nasal deformity, discharge, or epistaxis.    Lymph: No cervical lymphadenopathy. Skin: Warm and dry.   Cardiac: RRR, nl S1-S2, no murmurs appreciated.  Respiratory: Normal respiratory rate and rhythm.  Snoring occasionally.  CTAB without rales or wheezes. Abdomen: Abdomen soft without rigidity.  No TTP. No HSM.  Neuro: Sedated, extubated.  Grips hands, but too sedated to follow commands or respond to questions.   Psych: Unable to assess.    Labs on Admission:  The metabolic panel shows hypokalemia, normal renal function. The complete blood count shows no leukocytosis, anemia or thrombocytopenia.   Radiological Exams on Admission: Ct Maxillofacial Wo Cm 01/20/2015   "IMPRESSION: Left facial and orbital fractures as described. High attenuating content within the left globe most compatible with intra-articular hematoma."    Assessment/Plan 1. Globe rupture:  This is new.  The patient appears to have suffered a mechanical fall and globe rupture, eyelid/brow laceration and maxillary fracture (which will be repaired on non-urgent basis when globe has healed). -Start oral FQ per Dr. Manuella Ghazi, dischage on Avelox 400 daily until 1/26 -Hold glaucoma drops for now, may restart per Ophtho -MIVF with K -Acetaminophen for pain -Fentanyl 12.5-25 mcg IV PRN for severe  pain -PT/OT eval given weakness after anesthesia -Telemetry and continuous pulse oximetry   2. Hypokalemia:  Received only one dose of 10 mEq K in the ER. -Tele -Check magnesium -Replete K  3. HTN:  -Continue home aspirin, atenolol, and HCTZ  4. Hypothyroidism:  -Continue home levothyroxine      DVT PPx: SCDs Diet: Regular Consultants: Ophtho Code Status: Full Family Communication: Husband, present at bedside  Medical decision making: What exists of the patient's previous chart was reviewed in depth and the case was discussed with Dr. Manuella Ghazi. Patient seen 3:20 AM on 01/21/2015.  Disposition Plan:  Place in observation.  Replete K and treat pain.  PT eval for safety, given age and debility after anesthesia.  Husband doubts patient will be ready for discharge before tomorrow.  Edwin Dada [facility] Pager [phone]

## 2015-01-21 NOTE — Transfer of Care (Signed)
Immediate Anesthesia Transfer of Care Note  Patient: Molly [name]  Procedure(s) Performed: Procedure(s): REPAIR OF RUPTURED GLOBE and FACIAL LACERATIONS (Left) FACIAL LACERATION REPAIR (Left)  Patient Location: PACU  Anesthesia Type:General  Level of Consciousness: sedated, patient cooperative and responds to stimulation  Airway & Oxygen Therapy: Patient Spontanous Breathing and Patient connected to nasal cannula oxygen  Post-op Assessment: Report given to RN, Post -op Vital signs reviewed and stable and Patient moving all extremities X 4  Post vital signs: Reviewed and stable  Last Vitals:  Filed Vitals:   01/20/15 2107 01/20/15 2305  BP: 158/96   Pulse: 64 58  Temp: 36.8 C 36.7 C  Resp: 32 12    Complications: No apparent anesthesia complications

## 2015-01-21 NOTE — Anesthesia Postprocedure Evaluation (Signed)
Anesthesia Post Note  Patient: Molly [name]  Procedure(s) Performed: Procedure(s) (LRB): REPAIR OF RUPTURED GLOBE and FACIAL LACERATIONS (Left) FACIAL LACERATION REPAIR (Left)  Patient location during evaluation: PACU Anesthesia Type: General Level of consciousness: patient cooperative Pain management: pain level controlled Vital Signs Assessment: post-procedure vital signs reviewed and stable Respiratory status: spontaneous breathing, respiratory function stable and patient connected to nasal cannula oxygen Cardiovascular status: stable Postop Assessment: no signs of nausea or vomiting Anesthetic complications: no    Last Vitals:  Filed Vitals:   01/21/15 0350 01/21/15 1000  BP: 151/86 120/67  Pulse: 70 70  Temp: 36.7 C   Resp: 20 16    Last Pain:  Filed Vitals:   01/21/15 1056  PainSc: 7                  Sevilla Murtagh

## 2015-01-21 NOTE — Progress Notes (Signed)
S: Pain improved. Vision blurry.  O: VAN: 5 pt cc / LP OS  PLE OS: L/L: eyebrow lac well approximated C/S: 2+ injection, sclera   L: no view, known aphakic  A/P:  1. Rupture Globe OS: - Safe for discharge from an ophthalmic standpoint as long as cleared medically - Follow-up tomorrow at [facility] [phone]) as soon as discharged from hospital - Sleep w/ eye shield at bed time - No need to start drops in the left eye - placed atropine and Tobradex ointment - Please given drops to patient for discharge as will need them  2. Glaucoma OU: - Placed orders to resume glaucoma drops in the RIGHT eye only  - Cosopt BID right eye  - Brimonidine TID right eye  - Xalatan QHS right eye - No glaucoma drops in the LEFT eye  3. Eyebrow Laceration Left: - Tobradex ointment BID OS to incision.   Shaterrica Territo T. Manuella Ghazi, [place]

## 2015-01-21 NOTE — Anesthesia Procedure Notes (Signed)
Procedure Name: Intubation Date/Time: 01/21/2015 12:17 AM Performed by: Claris Che Pre-anesthesia Checklist: Patient identified, Emergency Drugs available, Suction available, Patient being monitored and Timeout performed Patient Re-evaluated:Patient Re-evaluated prior to inductionOxygen Delivery Method: Circle system utilized Preoxygenation: Pre-oxygenation with 100% oxygen Intubation Type: IV induction, Rapid sequence and Cricoid Pressure applied Ventilation: Mask ventilation without difficulty Laryngoscope Size: Mac and 3 Grade View: Grade II Tube type: Oral Tube size: 7.5 mm Number of attempts: 1 Airway Equipment and Method: Stylet Placement Confirmation: ETT inserted through vocal cords under direct vision,  positive ETCO2 and breath sounds checked- equal and bilateral Secured at: 23 cm Tube secured with: Tape Dental Injury: Teeth and Oropharynx as per pre-operative assessment

## 2015-01-21 NOTE — Op Note (Signed)
ATTENDING: Harrell Gave T. Manuella Ghazi, MD   OPERATIVE EYE:  LEFT PRE/POST DIAGNOSIS:  1. Left Eye w/ Ruptured Globe - suspicion for scleral laceration at limbus w/ uveal prolapse 2. Left eyebrow laceration   PROCEDURE:  1. Repair of scleral laceration at the limbus from 1-2 o'clock w/ resection of prolapsed uveal tissue [id number] 2. Removal of intraocular lens of left eye 3 Simple repair of 2.5 cm eyebrow laceration (12011)  ANESTHESIA: general anesthesia  COMMENTS: scarred conjunctiva due to h/o scleral buckle  DESCRIPTION OF PROCEDURE:  The patient was seen in the preoperative area where the surgical eye was  marked.  The patient was brought  back to the operating room and intubated and sedated by anesthesia. The eye was prepped and draped in the usual sterile fashion for surgery as well as the left half of her face.  The surgeon sat superior and a  lid speculum was placed. A conjunctival peritomy was performed of the supero-temporal quadrant and uveal tissue was noted to prolapsing through a scleral rupture adjacent to the limbus from 1-2 o'clock. Iris sweep was attempted to reposition the contents back into the eye; however, this was unsuccessful thus resection of this uveal tissue was performed. Next, the PCIOL - 3 piece IOL was removed via the scleral laceration given that it was prolapsed into the anterior chamber and against the cornea. Then attention was turned to closure with 9-0 nylon interrupted sutures with care to ensure knots were rotated internally. A paracentesis was made temporally and the [facility] was filled w/ BSS and the wound was seidel negative. The conjunctival peritomy was then closed with 7-0 vicryl with anchoring sutures at 12 and 3 o'clock and then a final suture to anchor the edge to the cornea. The lid speculum was removed and then attention turned to the eye brow laceration which was closed using 6-0 plain gut in an interrupted fashion. A pressure patch w/ Tobradex, Pred Forte,  Gatifloxicin, and Atropine was placed using paper tape and then eye shield over the top. Tobradex ointment was placed on the eyebrow laceration. The patient was extubated and transferred to PACU in good care. The patient was admitted to the hospitalist service due to need for repletion of hypokalemia.  POST OP CARE: Keep patch on until follow-up. Scheduled follow-up with me in clinic at 2 PM at [facility] or if still admitted will see following clinic Please start PO Avelox 400 mg daily for 7 days starting 01/21/15 AM  Christopher T. Manuella Ghazi, MD   .

## 2015-01-21 NOTE — Anesthesia Preprocedure Evaluation (Signed)
Anesthesia Evaluation  Patient identified by MRN, date of birth, ID bandGeneral Assessment Comment:somnolent post narcotics  Reviewed: Allergy & Precautions, NPO status , Patient's Chart, lab work & pertinent test results  History of Anesthesia Complications Negative for: history of anesthetic complications  Airway Mallampati: III  TM Distance: <3 FB Neck ROM: Full    Dental  (+) Teeth Intact   Pulmonary neg pulmonary ROS,    breath sounds clear to auscultation       Cardiovascular hypertension, Pt. on medications and Pt. on home beta blockers (-) angina+ Peripheral Vascular Disease  (-) Past MI  Rhythm:Regular     Neuro/Psych negative psych ROS   GI/Hepatic Neg liver ROS, GERD  ,  Endo/Other  Hypothyroidism   Renal/GU negative Renal ROS     Musculoskeletal   Abdominal   Peds  Hematology   Anesthesia Other Findings   Reproductive/Obstetrics                             Anesthesia Physical Anesthesia Plan  ASA: III and emergent  Anesthesia Plan: General   Post-op Pain Management:    Induction: Intravenous  Airway Management Planned: Oral ETT  Additional Equipment: None  Intra-op Plan:   Post-operative Plan: Extubation in OR  Informed Consent: I have reviewed the patients History and Physical, chart, labs and discussed the procedure including the risks, benefits and alternatives for the proposed anesthesia with the patient or authorized representative who has indicated his/her understanding and acceptance.   Dental advisory given  Plan Discussed with: CRNA and Surgeon  Anesthesia Plan Comments:         Anesthesia Quick Evaluation

## 2015-01-21 NOTE — Progress Notes (Signed)
Patient seen and examined, reported pain better controlled, repeat bmp k normalized, ophthalmology will see patient this evening, likely home with home health tomorrow. Home health ordered.

## 2015-01-22 ENCOUNTER — Encounter (HOSPITAL_COMMUNITY): Payer: Self-pay | Admitting: Emergency Medicine

## 2015-01-22 ENCOUNTER — Observation Stay (HOSPITAL_COMMUNITY)
Admission: EM | Admit: 2015-01-22 | Discharge: 2015-01-24 | Disposition: A | Discharge status: Home / Self Care | Payer: PPO | Attending: Internal Medicine | Admitting: Internal Medicine

## 2015-01-22 DIAGNOSIS — Z7982 Long term (current) use of aspirin: Secondary | ICD-10-CM | POA: Diagnosis not present

## 2015-01-22 DIAGNOSIS — S0532XD Ocular laceration without prolapse or loss of intraocular tissue, left eye, subsequent encounter: Secondary | ICD-10-CM

## 2015-01-22 DIAGNOSIS — H409 Unspecified glaucoma: Secondary | ICD-10-CM | POA: Diagnosis not present

## 2015-01-22 DIAGNOSIS — R202 Paresthesia of skin: Secondary | ICD-10-CM | POA: Insufficient documentation

## 2015-01-22 DIAGNOSIS — F0781 Postconcussional syndrome: Secondary | ICD-10-CM

## 2015-01-22 DIAGNOSIS — K219 Gastro-esophageal reflux disease without esophagitis: Secondary | ICD-10-CM | POA: Diagnosis not present

## 2015-01-22 DIAGNOSIS — S0532XA Ocular laceration without prolapse or loss of intraocular tissue, left eye, initial encounter: Secondary | ICD-10-CM | POA: Diagnosis not present

## 2015-01-22 DIAGNOSIS — S01112D Laceration without foreign body of left eyelid and periocular area, subsequent encounter: Secondary | ICD-10-CM

## 2015-01-22 DIAGNOSIS — W228XXA Striking against or struck by other objects, initial encounter: Secondary | ICD-10-CM | POA: Insufficient documentation

## 2015-01-22 DIAGNOSIS — Z79899 Other long term (current) drug therapy: Secondary | ICD-10-CM | POA: Diagnosis not present

## 2015-01-22 DIAGNOSIS — R531 Weakness: Principal | ICD-10-CM

## 2015-01-22 DIAGNOSIS — E860 Dehydration: Secondary | ICD-10-CM

## 2015-01-22 DIAGNOSIS — E876 Hypokalemia: Secondary | ICD-10-CM | POA: Diagnosis not present

## 2015-01-22 DIAGNOSIS — E785 Hyperlipidemia, unspecified: Secondary | ICD-10-CM | POA: Diagnosis not present

## 2015-01-22 DIAGNOSIS — R110 Nausea: Secondary | ICD-10-CM | POA: Insufficient documentation

## 2015-01-22 DIAGNOSIS — E039 Hypothyroidism, unspecified: Secondary | ICD-10-CM | POA: Diagnosis present

## 2015-01-22 DIAGNOSIS — S0520XD Ocular laceration and rupture with prolapse or loss of intraocular tissue, unspecified eye, subsequent encounter: Secondary | ICD-10-CM | POA: Diagnosis not present

## 2015-01-22 DIAGNOSIS — S0530XA Ocular laceration without prolapse or loss of intraocular tissue, unspecified eye, initial encounter: Secondary | ICD-10-CM | POA: Diagnosis present

## 2015-01-22 DIAGNOSIS — K5900 Constipation, unspecified: Secondary | ICD-10-CM | POA: Diagnosis not present

## 2015-01-22 DIAGNOSIS — I10 Essential (primary) hypertension: Secondary | ICD-10-CM | POA: Diagnosis not present

## 2015-01-22 DIAGNOSIS — R5383 Other fatigue: Secondary | ICD-10-CM

## 2015-01-22 DIAGNOSIS — S0990XA Unspecified injury of head, initial encounter: Secondary | ICD-10-CM

## 2015-01-22 DIAGNOSIS — R200 Anesthesia of skin: Secondary | ICD-10-CM

## 2015-01-22 LAB — URINALYSIS, ROUTINE W REFLEX MICROSCOPIC
Bilirubin Urine: NEGATIVE
Glucose, UA: NEGATIVE mg/dL
Hgb urine dipstick: NEGATIVE
Ketones, ur: NEGATIVE mg/dL
NITRITE: NEGATIVE
Protein, ur: NEGATIVE mg/dL
SPECIFIC GRAVITY, URINE: 1.012 (ref 1.005–1.030)
pH: 6.5 (ref 5.0–8.0)

## 2015-01-22 LAB — CBC
HCT: 41.4 % (ref 36.0–46.0)
HEMATOCRIT: 38 % (ref 36.0–46.0)
HEMOGLOBIN: 12.2 g/dL (ref 12.0–15.0)
HEMOGLOBIN: 13.9 g/dL (ref 12.0–15.0)
MCH: 30.9 pg (ref 26.0–34.0)
MCH: 31.4 pg (ref 26.0–34.0)
MCHC: 32.1 g/dL (ref 30.0–36.0)
MCHC: 33.6 g/dL (ref 30.0–36.0)
MCV: 96.2 fL (ref 78.0–100.0)
MCV: 93.7 fL (ref 78.0–100.0)
Platelets: 220 10*3/uL (ref 150–400)
Platelets: 262 10*3/uL (ref 150–400)
RBC: 3.95 MIL/uL (ref 3.87–5.11)
RBC: 4.42 MIL/uL (ref 3.87–5.11)
RDW: 13.9 % (ref 11.5–15.5)
RDW: 13.5 % (ref 11.5–15.5)
WBC: 6.3 10*3/uL (ref 4.0–10.5)
WBC: 6.4 10*3/uL (ref 4.0–10.5)

## 2015-01-22 LAB — URINE MICROSCOPIC-ADD ON: RBC / HPF: NONE SEEN RBC/hpf (ref 0–5)

## 2015-01-22 LAB — BASIC METABOLIC PANEL
ANION GAP: 13 (ref 5–15)
Anion gap: 8 (ref 5–15)
BUN: 10 mg/dL (ref 6–20)
BUN: 12 mg/dL (ref 6–20)
CALCIUM: 9.5 mg/dL (ref 8.9–10.3)
CHLORIDE: 103 mmol/L (ref 101–111)
CO2: 24 mmol/L (ref 22–32)
CO2: 23 mmol/L (ref 22–32)
CREATININE: 0.72 mg/dL (ref 0.44–1.00)
Calcium: 8.3 mg/dL — ABNORMAL LOW (ref 8.9–10.3)
Chloride: 100 mmol/L — ABNORMAL LOW (ref 101–111)
Creatinine, Ser: 0.67 mg/dL (ref 0.44–1.00)
GFR calc Af Amer: >60 mL/min (ref >60)
GFR calc non Af Amer: >60 mL/min (ref >60)
GLUCOSE: 131 mg/dL — AB (ref 65–99)
Glucose, Bld: 124 mg/dL — ABNORMAL HIGH (ref 65–99)
Potassium: 3.5 mmol/L (ref 3.5–5.1)
Potassium: 3.7 mmol/L (ref 3.5–5.1)
SODIUM: 136 mmol/L (ref 135–145)
Sodium: 135 mmol/L (ref 135–145)

## 2015-01-22 MED ORDER — NAPROXEN 250 MG PO TABS
500.0000 mg | ORAL_TABLET | Freq: Two times a day (BID) | ORAL | Status: DC
  Administered 2015-01-22: 500 mg via ORAL
  Filled 2015-01-22: qty 2

## 2015-01-22 MED ORDER — TOBRAMYCIN-DEXAMETHASONE 0.3-0.1 % OP OINT: 1.0000 "application " | TOPICAL_OINTMENT | OPHTHALMIC | Status: DC | PRN

## 2015-01-22 MED ORDER — MOXIFLOXACIN HCL 400 MG PO TABS: 400.0000 mg | ORAL_TABLET | Freq: Every day | ORAL | Status: DC

## 2015-01-22 MED ORDER — ACETAMINOPHEN 500 MG PO TABS: 500.0000 mg | ORAL_TABLET | Freq: Four times a day (QID) | ORAL | Status: DC | PRN

## 2015-01-22 MED ORDER — NAPROXEN 500 MG PO TABS: 500.0000 mg | ORAL_TABLET | Freq: Three times a day (TID) | ORAL | Status: DC

## 2015-01-22 NOTE — ED Provider Notes (Signed)
CSN: [id number]     Arrival date & time 01/22/15  2152 History  By signing my name below, I, Julien Nordmann, attest that this documentation has been prepared under the direction and in the presence of Lacretia Leigh, MD. Electronically Signed: Julien Nordmann, ED Scribe. 01/23/2015. 12:01 AM.    Chief Complaint  Patient presents with  . Weakness      The history is provided by the patient and the spouse. No language interpreter was used.   HPI Comments: Molly [name] is a 82 y.o. female who has a hx of hyperlipidemia and HTN presents to the Emergency Department complaining of constant, gradual worsening generalized weakness onset today. Pt states that she has severe nausea when she tries to eat. Per husband, pt has been having loss of appetite for the past 3 days, disorientation, "shaking all over" and barely taking any fluids including water. Pt was recently discharged from the hospital today after having her eye repaired for a fall that occurred two days ago. She reports she lost her balance and fell down the steps outside which caused her fall. Denies fever, cough, and congestion.  Past Medical History  Diagnosis Date  . Hyperlipidemia   . Diverticulosis of colon   . GERD (gastroesophageal reflux disease)   . Glaucoma   . Kidney stones   . Hypertension   . MALIGNANT NEOPLASM OF UTERUS PART UNSPECIFIED 1998  . Thyroid disease     hypothyroidism   Past Surgical History  Procedure Laterality Date  . Detached retina repair os    . Abdominal hysterectomy  12/22/1996    Uterine Cancer Dr Phineas Real  . Release r long finger a1 pulley  01/23/2008    Dr Daylene Katayama  . Breast biopsy  1996    Benign  . Cholecystectomy    . Ruptured globe exploration and repair Left 01/20/2015    Procedure: REPAIR OF RUPTURED GLOBE and FACIAL LACERATIONS;  Surgeon: Danice Goltz, MD;  Location: [place];  Service: Ophthalmology;  Laterality: Left;  . Facial laceration repair Left 01/20/2015    Procedure:  FACIAL LACERATION REPAIR;  Surgeon: Danice Goltz, MD;  Location: [place];  Service: Ophthalmology;  Laterality: Left;   Family History  Problem Relation Age of Onset  . Heart disease Father 35    MI  . Cancer Sister     Cervical Cancer  . Diabetes Neg Hx   . Breast cancer Neg Hx   . Colon cancer Maternal Aunt    Social History  Substance Use Topics  . Smoking status: Never Smoker   . Smokeless tobacco: Never Used  . Alcohol Use: No   OB History    Gravida Para Term Preterm AB TAB SAB Ectopic Multiple Living   0 0 0 0 0 0 0 0       Review of Systems  Constitutional: Positive for appetite change. Negative for fever.  HENT: Negative for congestion.   Respiratory: Negative for cough.   Gastrointestinal: Positive for nausea.  Neurological: Positive for weakness.  All other systems reviewed and are negative.     Allergies  Erythromycin; Ezetimibe-simvastatin; Macrodantin; Morphine; Nitrofurantoin; Procaine hcl; Simvastatin; Tape; Adhesive; and Morphine and related  Home Medications   Prior to Admission medications   Medication Sig Start Date End Date Taking? Authorizing Provider  acetaminophen (TYLENOL) 500 MG tablet Take 1 tablet (500 mg total) by mouth every 6 (six) hours as needed for mild pain or headache. 01/22/15   Louellen Molder, MD  aspirin 81 MG EC tablet Take 81 mg by mouth daily.      Historical Provider, MD  atenolol (TENORMIN) 25 MG tablet Take 1 tablet (25 mg total) by mouth daily. 11/23/14   Tonia Ghent, MD  AZOPT 1 % ophthalmic suspension Place 1 drop into both eyes 2 (two) times daily.  10/27/12   Historical Provider, MD  brimonidine (ALPHAGAN P) 0.1 % SOLN Apply 1 drop to eye 2 (two) times daily.    Historical Provider, MD  Cholecalciferol ([employer]S VITAMIN D) 2000 UNITS CAPS Take 1 capsule (2,000 Units total) by mouth daily. 11/23/14   Tonia Ghent, MD  Coenzyme Q10 (COQ10 PO) Take 1 tablet by mouth daily.     Historical Provider, MD   dorzolamide-timolol (COSOPT) 22.3-6.8 MG/ML ophthalmic solution Place 1 drop into both eyes 2 (two) times daily.    Historical Provider, MD  hydrochlorothiazide (MICROZIDE) 12.5 MG capsule Take 1 capsule by mouth daily 01/20/15   Tonia Ghent, MD  latanoprost (XALATAN) 0.005 % ophthalmic solution Place 1 drop into both eyes at bedtime.    Historical Provider, MD  levothyroxine (SYNTHROID, LEVOTHROID) 88 MCG tablet Take 1 tablet (88 mcg total) by mouth daily. 11/23/14   Tonia Ghent, MD  moxifloxacin (AVELOX) 400 MG tablet Take 1 tablet (400 mg total) by mouth daily at 8 pm. 01/23/15 01/28/15  Nishant Dhungel, MD  Multiple Vitamin (MULTIVITAMIN) tablet Take 1 tablet by mouth daily.      Historical Provider, MD  naproxen (NAPROSYN) 500 MG tablet Take 1 tablet (500 mg total) by mouth 3 (three) times daily with meals. 01/22/15   Nishant Dhungel, MD  Nutritional Supplements (ESTROVEN PO) Take 1 capsule by mouth daily.    Historical Provider, MD  tobramycin-dexamethasone [facility]) ophthalmic ointment Place 1 application into the left eye as needed. As instrcuted by your opthalmologist 01/22/15   Louellen Molder, MD   Triage vitals: BP 136/87 mmHg  Pulse 63  Temp(Src) 97.9 F (36.6 C)  Resp 20  SpO2 97% Physical Exam  Constitutional: She is oriented to person, place, and time. She appears well-developed and well-nourished.  Non-toxic appearance. No distress.  HENT:  Head: Normocephalic and atraumatic.  Eyes: Conjunctivae, EOM and lids are normal. Pupils are equal, round, and reactive to light.  Left eye is closed with sutures intact.  Neck: Normal range of motion. Neck supple. No tracheal deviation present. No thyroid mass present.  Cardiovascular: Normal rate, regular rhythm and normal heart sounds.  Exam reveals no gallop.   No murmur heard. Pulmonary/Chest: Effort normal and breath sounds normal. No stridor. No respiratory distress. She has no decreased breath sounds. She has no wheezes. She  has no rhonchi. She has no rales.  Abdominal: Soft. Normal appearance and bowel sounds are normal. She exhibits no distension. There is no tenderness. There is no rebound and no CVA tenderness.  Musculoskeletal: Normal range of motion. She exhibits no edema or tenderness.  Neurological: She is alert and oriented to person, place, and time. She has normal strength. No cranial nerve deficit or sensory deficit. GCS eye subscore is 4. GCS verbal subscore is 5. GCS motor subscore is 6.  Skin: Skin is warm and dry. No abrasion and no rash noted.  Psychiatric: She has a normal mood and affect. Her speech is normal and behavior is normal.  Nursing note and vitals reviewed.   ED Course  Procedures  DIAGNOSTIC STUDIES: Oxygen Saturation is 97% on RA, normal by my interpretation.  COORDINATION OF CARE:  11:59 PM Discussed treatment plan which includes nausea medication and IV fluids with pt at bedside and pt agreed to plan.  Labs Review Labs Reviewed  BASIC METABOLIC PANEL - Abnormal; Notable for the following:    Chloride 100 (*)    Glucose, Bld 124 (*)    All other components within normal limits  URINALYSIS, ROUTINE W REFLEX MICROSCOPIC (NOT AT [facility]) - Abnormal; Notable for the following:    APPearance CLOUDY (*)    Leukocytes, UA TRACE (*)    All other components within normal limits  URINE MICROSCOPIC-ADD ON - Abnormal; Notable for the following:    Squamous Epithelial / LPF 0-5 (*)    Bacteria, UA RARE (*)    All other components within normal limits  CBC    Imaging Review No results found. I have personally reviewed and evaluated these images and lab results as part of my medical decision-making.   EKG Interpretation   Date/Time:  Friday January 22 2015 22:48:20 EST Ventricular Rate:  62 PR Interval:  144 QRS Duration: 72 QT Interval:  410 QTC Calculation: 416 R Axis:   77 Text Interpretation:  Normal sinus rhythm Cannot rule out Anterior infarct  , age undetermined  Abnormal ECG No significant change since last tracing  Confirmed by Artemio Dobie  MD, Matthan Sledge ([id number]) on 01/22/2015 11:52:48 PM      MDM   Final diagnoses:  None   I personally performed the services described in this documentation, which was scribed in my presence. The recorded information has been reviewed and is accurate.   Patient given IV fluids and food to eat here and still complains of being weak and off balance. Discussed with the hospitalist and she will be admitted    Lacretia Leigh, MD 01/23/15 [phone]

## 2015-01-22 NOTE — Care Management (Signed)
Patient was discharged prior to Case manager speaking with her. Case manager contacted via telephone,.Referral was called to Tiffany, Advanced Transition to Home Specialist.

## 2015-01-22 NOTE — Progress Notes (Signed)
Physical Therapy Treatment Patient Details Name: Molly [name] MRN: [id number] DOB: [birth year] Today's Date: 01/22/2015    History of Present Illness 82 y.o. female w/ POH of glaucoma OU, RD OU, CE/IOL OU who presents for evaluation of ruptured globe following ground level fall    PT Comments    Patient is making good progress with PT and received stair training this session.  From a mobility standpoint anticipate patient will be ready for DC home when medically ready.     Follow Up Recommendations  Home health PT;Supervision for mobility/OOB     Equipment Recommendations  Rolling walker with 5" wheels    Recommendations for Other Services       Precautions / Restrictions Precautions Precautions: Fall Restrictions Weight Bearing Restrictions: No    Mobility  Bed Mobility Overal bed mobility: Needs Assistance Bed Mobility: Supine to Sit     Supine to sit: Min assist     General bed mobility comments: min A to elevate trunk into sitting;vc for sequencing; HOB flat and no use of bed rails  Transfers Overall transfer level: Needs assistance Equipment used: Rolling walker (2 wheeled)   Sit to Stand: Min guard         General transfer comment: supervision for safety  Ambulation/Gait Ambulation/Gait assistance: Supervision Ambulation Distance (Feet): 200 Feet Assistive device: Rolling walker (2 wheeled) Gait Pattern/deviations: Step-through pattern Gait velocity: decreased   General Gait Details: vc for position of RW and upright posture initially; pt with even bilat step length and no unsteadiness   Stairs Stairs: Yes Stairs assistance: Supervision Stair Management: One rail Left;Forwards;Sideways Number of Stairs: 2 General stair comments: vc for technique and demonstration of descending sideways for increase stability; pt with good safety awareness and no unsteadiness  Wheelchair Mobility    Modified Rankin (Stroke Patients Only)        Balance     Sitting balance-Leahy Scale: Good       Standing balance-Leahy Scale: Fair                      Cognition Arousal/Alertness: Awake/alert Behavior During Therapy: WFL for tasks assessed/performed Overall Cognitive Status: Within Functional Limits for tasks assessed                      Exercises      General Comments        Pertinent Vitals/Pain Pain Assessment: Faces Faces Pain Scale: Hurts a little bit Pain Location: head, bilat LE, and L shoulder Pain Descriptors / Indicators: Sore Pain Intervention(s): Limited activity within patient's tolerance;Monitored during session;Repositioned    Home Living                      Prior Function            PT Goals (current goals can now be found in the care plan section) Acute Rehab PT Goals Patient Stated Goal: go back home from the hospital PT Goal Formulation: With patient Time For Goal Achievement: 02/04/15 Potential to Achieve Goals: Good Progress towards PT goals: Progressing toward goals    Frequency  Min 3X/week    PT Plan Current plan remains appropriate    Co-evaluation             End of Session Equipment Utilized During Treatment: Gait belt Activity Tolerance: Patient tolerated treatment well Patient left: in chair;with call bell/phone within reach;with family/visitor present     Time: [id number] PT  Time Calculation (min) (ACUTE ONLY): 21 min  Charges:  $Gait Training: 8-22 mins                    G Codes:  Functional Assessment Tool Used: clinical judgment Functional Limitation: Mobility: Walking and moving around Mobility: Walking and Moving Around Current Status [phone]): At least 20 percent but less than 40 percent impaired, limited or restricted Mobility: Walking and Moving Around Goal Status [phone]): At least 1 percent but less than 20 percent impaired, limited or restricted   Salina April, PTA Pager: [phone]   01/22/2015, 9:10 AM

## 2015-01-22 NOTE — Discharge Summary (Signed)
Physician Discharge Summary  Molly [name] [id number] DOB: 1929-09-24 DOA: 01/20/2015  PCP: Elsie Stain, MD  Admit date: 01/20/2015 Discharge date: 01/22/2015  Time spent: 30 minutes  Recommendations for Outpatient Follow-up:  1. Discharge home. Patient will go to see the ophthalmologist in his office directly from the hospital upon discharge. Complete antibiotic course on 1/26 (Avelox) 2. Follow-up with PCP in 1-2 weeks   Discharge Diagnoses:  Principal Problem:   Ruptured globe (left eye)  Active Problems:   Hypothyroidism   Essential hypertension   Glaucoma   Hypokalemia   Eyebrow laceration   Discharge Condition: Fair  Diet recommendation: Regular   Code Status: Full code  [facility] Weights   01/20/15 2107  Weight: 63.05 kg (139 lb)    History of present illness:  82 year old female with history of hypertension, hypothyroidism and glaucoma presented after sustaining a mechanical fall at home and tripped and hit her head probably into her car and had severe pain in the left face around the eye and bleeding from the left eye. She did not have any loss of consciousness. She was brought to the ED by her husband with a head CT showed maxillary fracture and possible ruptured globe of the left eye. Ophthalmology was consulted who took her to operating room and repaired her left ruptured globe and left eyelid laceration.  Maxillofacial fracture were discussed with maxillofacial surgeon on the phone who defended nonemergent staged surgical repair of the skull fracture after healing of the globe.  Hospitalist ask to admit patient on observation. She was also found to have hypokalemia.   Hospital Course:  Mechanical fall with rupture of the left globe. Patient underwent surgery with repair of the scleral laceration, resection of the prolapsed uveal tissue and removal of intraocular lens of the left eye. Patient should follow-up with ophthalmologist in his office upon  discharge. Recommend sleeping with eye shield at bedtime. She had atropine and TobraDex ointment applied by ophthalmologist. I have given her those medications. -Prescribed Naprosyn for pain control. Will be treated with one week of oral Avelox.   History of glaucoma Recommended to use her eyedrops in her right eye only.  Left facial and orbital fractures Patient reports off and on numbness in the left side of the face. Continue pain medications. He physician spoke with trauma? Maxillofacial surgeon Dr. Birdena Jubilee who recommended to have operative procedure on a delayed basis to allow the globe to heal. Patient will need referral to maxillofacial surgery as outpatient.  Left eyebrow laceration TobraDex ointment twice a day to the laceration.  Hypokalemia Replenished. Resume HCTZ.  Stable to be discharged home with outpatient follow-up.  Procedures:  CT head  Maxillofacial CT  Surgical repair of scleral laceration  Consultations: Ophthalmology (Dr. Bobbye Riggs)  Discharge Exam: Filed Vitals:   01/21/15 2006 01/22/15 0510  BP: 116/63 153/73  Pulse: 57 53  Temp: 98.9 F (37.2 C) 98.2 F (36.8 C)  Resp: 18 16    General: Elderly female not in distress HEENT: swelling of left eye , unable to open with eyelid laceration, able to open mouth fully  Cardiovascular: Normal S1 and S2, no murmurs  Respiratory: Clear bilaterally GI: Soft, nondistended, nontender Musculoskeletal: Warm, no edema  Discharge Instructions   Discharge Instructions    Face-to-face encounter (required for Medicare/Medicaid patients)    Complete by:  As directed   I Xu,Fang certify that this patient is under my care and that I, or a nurse practitioner or physician's assistant working with me,  had a face-to-face encounter that meets the physician face-to-face encounter requirements with this patient on 01/21/2015. The encounter with the patient was in whole, or in part for the following medical condition(s) which is  the primary reason for home health care (List medical condition): FTT  The encounter with the patient was in whole, or in part, for the following medical condition, which is the primary reason for home health care:  FTT  I certify that, based on my findings, the following services are medically necessary home health services:   Nursing Physical therapy    Reason for Medically Necessary Home Health Services:  Skilled Nursing- Change/Decline in Patient Status  My clinical findings support the need for the above services:  Unsafe ambulation due to balance issues  Further, I certify that my clinical findings support that this patient is homebound due to:  Unsafe ambulation due to balance issues     Home Health    Complete by:  As directed   To provide the following care/treatments:   PT OT RN            Current Discharge Medication List    START taking these medications   Details  acetaminophen (TYLENOL) 500 MG tablet Take 1 tablet (500 mg total) by mouth every 6 (six) hours as needed for mild pain or headache. Qty: 30 tablet, Refills: 0    moxifloxacin (AVELOX) 400 MG tablet Take 1 tablet (400 mg total) by mouth daily at 8 pm. Qty: 6 tablet, Refills: 0    naproxen (NAPROSYN) 500 MG tablet Take 1 tablet (500 mg total) by mouth 3 (three) times daily with meals. Qty: 15 tablet, Refills: 0    tobramycin-dexamethasone (TOBRADEX) ophthalmic ointment Place 1 application into the left eye as needed. As instrcuted by your opthalmologist Qty: 3.5 g, Refills: 0      CONTINUE these medications which have NOT CHANGED   Details  aspirin 81 MG EC tablet Take 81 mg by mouth daily.      atenolol (TENORMIN) 25 MG tablet Take 1 tablet (25 mg total) by mouth daily. Qty: 90 tablet, Refills: 3    AZOPT 1 % ophthalmic suspension Place 1 drop into both eyes 2 (two) times daily.     brimonidine (ALPHAGAN P) 0.1 % SOLN Apply 1 drop to eye 2 (two) times daily.    Cholecalciferol ([employer] VITAMIN D) 2000  UNITS CAPS Take 1 capsule (2,000 Units total) by mouth daily.    Coenzyme Q10 (COQ10 PO) Take 1 tablet by mouth daily.     dorzolamide-timolol (COSOPT) 22.3-6.8 MG/ML ophthalmic solution Place 1 drop into both eyes 2 (two) times daily.    hydrochlorothiazide (MICROZIDE) 12.5 MG capsule Take 1 capsule by mouth daily Qty: 90 capsule, Refills: 1    latanoprost (XALATAN) 0.005 % ophthalmic solution Place 1 drop into both eyes at bedtime.    levothyroxine (SYNTHROID, LEVOTHROID) 88 MCG tablet Take 1 tablet (88 mcg total) by mouth daily. Qty: 90 tablet, Refills: 3    Multiple Vitamin (MULTIVITAMIN) tablet Take 1 tablet by mouth daily.      Nutritional Supplements (ESTROVEN PO) Take 1 capsule by mouth daily.       Allergies  Allergen Reactions  . Erythromycin     Intolerant  . Ezetimibe-Simvastatin     REACTION: MUSCLE ACHES AND PAINS.   Clancy Gourd [Nitrofurantoin Macrocrystal] Nausea Only  . Morphine     Chest pain  . Nitrofurantoin     Unrecalled reaction  . Procaine  Hcl     REACTION: UNSPECIFIED  . Simvastatin     myalgias  . Tape     Rash  . Adhesive [Tape] Rash  . Morphine And Related Palpitations   Follow-up Information    Follow up with Danice Goltz, MD.   Specialty:  Ophthalmology   Why:  go to office  after discharge   Contact information:   [place] [id number] [phone]       Follow up with Elsie Stain, MD. Schedule an appointment as soon as possible for a visit in 2 weeks.   Specialty:  Family Medicine   Contact information:   [place] [id number] [phone]        The results of significant diagnostics from this hospitalization (including imaging, microbiology, ancillary and laboratory) are listed below for reference.    Significant Diagnostic Studies: Dg Bone Density  12/31/2014  EXAM: DUAL X-RAY ABSORPTIOMETRY (DXA) FOR BONE MINERAL DENSITY IMPRESSION: Referring Physician:   Tonia Ghent PATIENT: Name: KALIA, ANDREEN Patient ID: [id number] Birth Date: 18-Jan-1929 Height: 63.0 in. Sex: Female Measured: 12/31/2014 Weight: 140.0 lbs. Indications: Advanced Age, Bilateral Ovariectomy (65.51), Caucasian, Estrogen Deficient, Height Loss (781.91), History of Osteopenia, Hysterectomy, Postmenopausal, Synthroid, Secondary Osteoporosis Fractures: Treatments: Calcium (E943.0), Vitamin D (E933.5) ASSESSMENT: The BMD measured at Femur Neck Left is 0.841 g/cm2 with a T-score of -1.4. This patient is considered to be osteopenic according to [facility]) criteria. L-1, L-2 was excluded due to degenerative changes. Site Region Measured Date Measured Age YA BMD Significant CHANGE T-score DualFemur Neck Left 12/31/2014    83.2         -1.4    0.841 g/cm2 AP Spine  L3-L4     14/31/2014    83.2         -0.5    1.160 g/cm2 World Health Organization [facility]) criteria for post-menopausal, Caucasian Women: Normal       T-score at or above -1 SD Osteopenia   T-score between -1 and -2.5 SD Osteoporosis T-score at or below -2.5 SD RECOMMENDATION: [place] recommends that FDA-approved medical therapies be considered in postmenopausal women and men age 66 or older with a: 1. Hip or vertebral (clinical or morphometric) fracture. 2. T-score of <-2.5 at the spine or hip. 3. Ten-year fracture probability by FRAX of 3% or greater for hip fracture or 20% or greater for major osteoporotic fracture. All treatment decisions require clinical judgment and consideration of individual patient factors, including patient preferences, co-morbidities, previous drug use, risk factors not captured in the FRAX model (e.g. falls, vitamin D deficiency, increased bone turnover, interval significant decline in bone density) and possible under - or over-estimation of fracture risk by FRAX. All patients should ensure an adequate intake of dietary calcium (1200 mg/d) and vitamin D (800 IU daily)  unless contraindicated. FOLLOW-UP: People with diagnosed cases of osteoporosis or at high risk for fracture should have regular bone mineral density tests. For patients eligible for Medicare, routine testing is allowed once every 2 years. The testing frequency can be increased to one year for patients who have rapidly progressing disease, those who are receiving or discontinuing medical therapy to restore bone mass, or have additional risk factors. I have reviewed this report, and agree with the above findings. [facility] FRAX* 10-year Probability of Fracture Based on femoral neck BMD: DualFemur (Left) Major Osteoporotic Fracture: 13.4% Hip Fracture:  3.5% Population:                  [place] (Caucasian) Risk Factors:                Secondary Osteoporosis *FRAX is a Materials engineer of the [employer] for Metabolic Bone Disease, a [place] (WHO) [facility]. ASSESSMENT: The probability of a major osteoporotic fracture is 13.4 % within the next ten years. The probability of a hip fracture is 3.5 % within the next ten years. Electronically Signed   By: Lajean Manes M.D.   On: 12/31/2014 16:32   Mm Screening Breast Tomo Bilateral  01/01/2015  CLINICAL DATA:  Screening. EXAM: DIGITAL SCREENING BILATERAL MAMMOGRAM WITH 3D TOMO WITH CAD COMPARISON:  Previous exam(s). ACR Breast Density Category b: There are scattered areas of fibroglandular density. FINDINGS: There are no findings suspicious for malignancy. Images were processed with CAD. IMPRESSION: No mammographic evidence of malignancy. A result letter of this screening mammogram will be mailed directly to the patient. RECOMMENDATION: Screening mammogram in one year. (Code:SM-B-01Y) BI-RADS CATEGORY  1: Negative. Electronically Signed   By: Lajean Manes M.D.   On: 01/01/2015 10:38   Ct Maxillofacial Wo Cm  01/20/2015  CLINICAL DATA:  82 year old female with left thigh trauma. EXAM: CT  MAXILLOFACIAL WITHOUT CONTRAST TECHNIQUE: Multidetector CT imaging of the maxillofacial structures was performed. Multiplanar CT image reconstructions were also generated. A small metallic BB was placed on the right temple in order to reliably differentiate right from left. COMPARISON:  None. FINDINGS: There is a minimally depressed fracture of the left zygomatic arch. There are displaced fractures of the posterolateral wall of the left maxillary sinus. There is a mildly depressed fracture of the anterior wall of the left maxillary sinus. There is a mildly displaced fracture of the left orbital floor along the inferior orbital groove. There is a minimally displaced fracture of the lateral wall of the left orbit. A small fracture fragment noted abutting the lateral rectus muscle. Correlation with clinical exam is recommended to exclude ocular entrapment. There is fracture of the inferior orbital rim. No other acute fracture identified. The mandible and pterygoid plates are intact. There is high attenuating content within the left globe concerning for intra-ocular hemorrhage. There is mild enlargement of the left globe. Correlation with clinical exam recommended. There is scleral banding of the left globe. The retro-orbital fat are preserved. There is partial opacification of the maxillary sinuses bilaterally. High attenuating content within the left maxillary sinus compatible with hemosinus. The mastoid air cells are clear. No acute hemorrhage identified within the visualized brain. IMPRESSION: Left facial and orbital fractures as described. High attenuating content within the left globe most compatible with intra-articular hematoma. Clinical correlation and ophthalmology consult is advised. These results were called by telephone at the time of interpretation on 01/20/2015 at 10:20 pm to Dr. Davonna Belling , who verbally acknowledged these results. Electronically Signed   By: Anner Crete M.D.   On: 01/20/2015  22:25    Microbiology: No results found for this or any previous visit (from the past 240 hour(s)).   Labs: Basic Metabolic Panel:  Recent Labs Lab 01/20/15 2225 01/21/15 1040 01/22/15 0359  NA 139 136 135  K 2.8* 4.7 3.5  CL 101 100* 103  CO2  --  27 24  GLUCOSE 179* 185* 131*  BUN 17 7 10   CREATININE 0.70 0.81 0.72  CALCIUM  --  8.7* 8.3*  MG  --  1.9  --    Liver Function Tests: No results for input(s): AST, ALT, ALKPHOS, BILITOT, PROT, ALBUMIN in the last 168 hours. No results for input(s): LIPASE, AMYLASE in the last 168 hours. No results for input(s): AMMONIA in the last 168 hours. CBC:  Recent Labs Lab 01/20/15 2222 01/20/15 2225 01/22/15 0359  WBC 9.0  --  6.3  NEUTROABS 5.4  --   --   HGB 13.9 15.3* 12.2  HCT 42.5 45.0 38.0  MCV 93.4  --  96.2  PLT 263  --  220   Cardiac Enzymes: No results for input(s): CKTOTAL, CKMB, CKMBINDEX, TROPONINI in the last 168 hours. BNP: BNP (last 3 results) No results for input(s): BNP in the last 8760 hours.  ProBNP (last 3 results) No results for input(s): PROBNP in the last 8760 hours.  CBG: No results for input(s): GLUCAP in the last 168 hours.     Signed:  Louellen Molder MD.  [facility] 01/22/2015, 10:57 AM

## 2015-01-22 NOTE — ED Notes (Signed)
Pt was discharged from hospital today s/p fall on 1/18.  Family reports generalized weakness, shaking all over, and disorientation.  Pt alert and oriented x 4 at this time.  Husband concerned that pt may be dehydrated.

## 2015-01-23 ENCOUNTER — Observation Stay (HOSPITAL_COMMUNITY): Payer: PPO

## 2015-01-23 ENCOUNTER — Encounter (HOSPITAL_COMMUNITY): Payer: Self-pay | Admitting: Family Medicine

## 2015-01-23 DIAGNOSIS — R200 Anesthesia of skin: Secondary | ICD-10-CM

## 2015-01-23 DIAGNOSIS — E860 Dehydration: Secondary | ICD-10-CM

## 2015-01-23 DIAGNOSIS — F0781 Postconcussional syndrome: Secondary | ICD-10-CM

## 2015-01-23 DIAGNOSIS — R202 Paresthesia of skin: Secondary | ICD-10-CM

## 2015-01-23 DIAGNOSIS — I10 Essential (primary) hypertension: Secondary | ICD-10-CM

## 2015-01-23 DIAGNOSIS — E038 Other specified hypothyroidism: Secondary | ICD-10-CM

## 2015-01-23 DIAGNOSIS — S0292XA Unspecified fracture of facial bones, initial encounter for closed fracture: Secondary | ICD-10-CM | POA: Diagnosis not present

## 2015-01-23 DIAGNOSIS — S0990XA Unspecified injury of head, initial encounter: Secondary | ICD-10-CM | POA: Diagnosis not present

## 2015-01-23 DIAGNOSIS — R531 Weakness: Secondary | ICD-10-CM | POA: Diagnosis not present

## 2015-01-23 DIAGNOSIS — R5383 Other fatigue: Secondary | ICD-10-CM

## 2015-01-23 DIAGNOSIS — E034 Atrophy of thyroid (acquired): Secondary | ICD-10-CM

## 2015-01-23 DIAGNOSIS — S0532XD Ocular laceration without prolapse or loss of intraocular tissue, left eye, subsequent encounter: Secondary | ICD-10-CM

## 2015-01-23 LAB — BASIC METABOLIC PANEL
Anion gap: 8 (ref 5–15)
BUN: 10 mg/dL (ref 6–20)
CO2: 25 mmol/L (ref 22–32)
Calcium: 8.3 mg/dL — ABNORMAL LOW (ref 8.9–10.3)
Chloride: 108 mmol/L (ref 101–111)
Creatinine, Ser: 0.69 mg/dL (ref 0.44–1.00)
GFR calc Af Amer: >60 mL/min (ref >60)
GFR calc non Af Amer: >60 mL/min (ref >60)
Glucose, Bld: 131 mg/dL — ABNORMAL HIGH (ref 65–99)
Potassium: 3.6 mmol/L (ref 3.5–5.1)
Sodium: 141 mmol/L (ref 135–145)

## 2015-01-23 MED ORDER — SODIUM CHLORIDE 0.9 % IV BOLUS (SEPSIS)
2000.0000 mL | Freq: Once | INTRAVENOUS | Status: AC
  Administered 2015-01-23: 2000 mL via INTRAVENOUS

## 2015-01-23 MED ORDER — VITAMIN D 1000 UNITS PO TABS
2000.0000 [IU] | ORAL_TABLET | Freq: Every day | ORAL | Status: DC
  Administered 2015-01-23 – 2015-01-24 (×2): 2000 [IU] via ORAL
  Filled 2015-01-23 (×2): qty 2

## 2015-01-23 MED ORDER — ACETAMINOPHEN 500 MG PO TABS: 500.0000 mg | ORAL_TABLET | Freq: Four times a day (QID) | ORAL | Status: DC | PRN

## 2015-01-23 MED ORDER — HYDROCHLOROTHIAZIDE 12.5 MG PO CAPS: 12.5000 mg | ORAL_CAPSULE | Freq: Every day | ORAL | Status: DC

## 2015-01-23 MED ORDER — LATANOPROST 0.005 % OP SOLN
1.0000 [drp] | Freq: Every day | OPHTHALMIC | Status: DC
  Administered 2015-01-23: 1 [drp] via OPHTHALMIC
  Filled 2015-01-23: qty 2.5

## 2015-01-23 MED ORDER — ONDANSETRON HCL 4 MG PO TABS
4.0000 mg | ORAL_TABLET | Freq: Four times a day (QID) | ORAL | Status: DC | PRN
  Administered 2015-01-23: 4 mg via ORAL
  Filled 2015-01-23: qty 1

## 2015-01-23 MED ORDER — TOBRAMYCIN-DEXAMETHASONE 0.3-0.1 % OP OINT
1.0000 "application " | TOPICAL_OINTMENT | OPHTHALMIC | Status: DC | PRN
  Filled 2015-01-23: qty 3.5

## 2015-01-23 MED ORDER — ONDANSETRON HCL 4 MG/2ML IJ SOLN
4.0000 mg | Freq: Once | INTRAMUSCULAR | Status: AC
  Administered 2015-01-23: 4 mg via INTRAVENOUS
  Filled 2015-01-23: qty 2

## 2015-01-23 MED ORDER — POTASSIUM CHLORIDE IN NACL 20-0.9 MEQ/L-% IV SOLN: INTRAVENOUS | Status: DC

## 2015-01-23 MED ORDER — SENNOSIDES-DOCUSATE SODIUM 8.6-50 MG PO TABS
1.0000 | ORAL_TABLET | Freq: Every evening | ORAL | Status: DC | PRN
  Administered 2015-01-23: 1 via ORAL
  Filled 2015-01-23: qty 1

## 2015-01-23 MED ORDER — SODIUM CHLORIDE 0.9 % IV SOLN: INTRAVENOUS | Status: DC

## 2015-01-23 MED ORDER — BRIMONIDINE TARTRATE 0.2 % OP SOLN
1.0000 [drp] | Freq: Two times a day (BID) | OPHTHALMIC | Status: DC
  Administered 2015-01-23 – 2015-01-24 (×3): 1 [drp] via OPHTHALMIC
  Filled 2015-01-23: qty 5

## 2015-01-23 MED ORDER — LEVOTHYROXINE SODIUM 88 MCG PO TABS
88.0000 ug | ORAL_TABLET | Freq: Every day | ORAL | Status: DC
  Administered 2015-01-23 – 2015-01-24 (×2): 88 ug via ORAL
  Filled 2015-01-23 (×2): qty 1

## 2015-01-23 MED ORDER — DORZOLAMIDE HCL-TIMOLOL MAL 2-0.5 % OP SOLN
1.0000 [drp] | Freq: Two times a day (BID) | OPHTHALMIC | Status: DC
  Administered 2015-01-23 – 2015-01-24 (×3): 1 [drp] via OPHTHALMIC
  Filled 2015-01-23: qty 10

## 2015-01-23 MED ORDER — LEVOFLOXACIN 500 MG PO TABS
750.0000 mg | ORAL_TABLET | Freq: Every day | ORAL | Status: DC
  Administered 2015-01-23: 750 mg via ORAL
  Filled 2015-01-23: qty 2

## 2015-01-23 MED ORDER — ONDANSETRON HCL 4 MG/2ML IJ SOLN: 4.0000 mg | Freq: Four times a day (QID) | INTRAMUSCULAR | Status: DC | PRN

## 2015-01-23 MED ORDER — HYDROCODONE-ACETAMINOPHEN 5-325 MG PO TABS: 1.0000 | ORAL_TABLET | ORAL | Status: DC | PRN

## 2015-01-23 MED ORDER — SODIUM CHLORIDE 0.9 % IV SOLN
INTRAVENOUS | Status: DC
  Filled 2015-01-23 (×3): qty 1000

## 2015-01-23 MED ORDER — ATENOLOL 50 MG PO TABS
25.0000 mg | ORAL_TABLET | Freq: Every day | ORAL | Status: DC
  Administered 2015-01-23 – 2015-01-24 (×2): 25 mg via ORAL
  Filled 2015-01-23 (×2): qty 1

## 2015-01-23 MED ORDER — NAPROXEN 250 MG PO TABS: 500.0000 mg | ORAL_TABLET | Freq: Three times a day (TID) | ORAL | Status: DC | PRN

## 2015-01-23 NOTE — H&P (Signed)
History and Physical  Patient Name: Molly [name]     [id number]    DOB: 30-Sep-1929    DOA: 01/22/2015 Referring physician: Vivi Martens, MD PCP: Elsie Stain, MD      Chief Complaint: Weakness and inability to take PO  HPI: Molly [name] is a 82 y.o. female with a past medical history significant for hypothyroidism, HTN, and recent L eye injury who presents with dehydration.  The patient suffered a mechanical fall 3 days ago in her garage and hit her head, suffering a ruptured left globe and left facial bone fracture. Wednesday night she underwent repair of the globe and an eye laceration, and was observed post-operatively because of prolonged weakness after anesthesia as well as hypokalemia.  The hypokalemia resolved with supplementation, the patient was evaluated by PT and OT (who recommended home health PT and 24 hours supervision), and she was discharged yesterday noon.  The patient and her husband state that since her surgery, she has had no appetite and nausea with any attempt to eat, with the result that she has taken only a few mouthfuls of food for three days.  Today, the patient ate no breakfast or lunch or dinner.  Over the course of the afternoon and evening, her husband felt that she was increasingly weak, shaky and had dry mucus membranes.  He also felt that she was disoriented, anxious, and seeing colors on her clothing and the wall that weren't there, so he brought her back to the ER.  In the ED, the patient had normal electrolytes and renal function, complete blood count, and urinalysis.  She failed a fluid challenge she reports, and felt weak despite 2L NS, and so TRH were asked to evaluate for observation.    Her eye pain is improving.        Review of Systems:  Pt complains of weakness, shakiness, feeling anxious, feeling nausea with the smell of food, seeing colors on her clothing, decreased oral intake. Pt denies any confusion, focal weakness, speech  difficulties.  All other systems negative except as just noted or noted in the history of present illness.  Allergies  Allergen Reactions  . Erythromycin     Intolerant  . Ezetimibe-Simvastatin     REACTION: MUSCLE ACHES AND PAINS.   Clancy Gourd [Nitrofurantoin Macrocrystal] Nausea Only  . Morphine     Chest pain  . Nitrofurantoin     Unrecalled reaction  . Procaine Hcl     REACTION: UNSPECIFIED  . Simvastatin     myalgias  . Tape     Rash  . Adhesive [Tape] Rash  . Morphine And Related Palpitations    Prior to Admission medications   Medication Sig Start Date End Date Taking? Authorizing Provider  acetaminophen (TYLENOL) 500 MG tablet Take 1 tablet (500 mg total) by mouth every 6 (six) hours as needed for mild pain or headache. 01/22/15  Yes Nishant Dhungel, MD  aspirin 81 MG EC tablet Take 81 mg by mouth daily.     Yes Historical Provider, MD  atenolol (TENORMIN) 25 MG tablet Take 1 tablet (25 mg total) by mouth daily. 11/23/14  Yes Tonia Ghent, MD  AZOPT 1 % ophthalmic suspension Place 1 drop into both eyes 2 (two) times daily.  10/27/12  Yes Historical Provider, MD  brimonidine (ALPHAGAN P) 0.1 % SOLN Apply 1 drop to eye 2 (two) times daily.   Yes Historical Provider, MD  Cholecalciferol ([employer] VITAMIN D) 2000 UNITS CAPS Take 1 capsule (  2,000 Units total) by mouth daily. 11/23/14  Yes Tonia Ghent, MD  Coenzyme Q10 (COQ10 PO) Take 1 tablet by mouth daily.    Yes Historical Provider, MD  dorzolamide-timolol (COSOPT) 22.3-6.8 MG/ML ophthalmic solution Place 1 drop into both eyes 2 (two) times daily.   Yes Historical Provider, MD  hydrochlorothiazide (MICROZIDE) 12.5 MG capsule Take 1 capsule by mouth daily 01/20/15  Yes Tonia Ghent, MD  latanoprost (XALATAN) 0.005 % ophthalmic solution Place 1 drop into both eyes at bedtime.   Yes Historical Provider, MD  levothyroxine (SYNTHROID, LEVOTHROID) 88 MCG tablet Take 1 tablet (88 mcg total) by mouth daily. 11/23/14  Yes Tonia Ghent, MD  moxifloxacin (AVELOX) 400 MG tablet Take 1 tablet (400 mg total) by mouth daily at 8 pm. 01/23/15 01/28/15 Yes Nishant Dhungel, MD  Multiple Vitamin (MULTIVITAMIN) tablet Take 1 tablet by mouth daily.     Yes Historical Provider, MD  Nutritional Supplements (ESTROVEN PO) Take 1 capsule by mouth daily.   Yes Historical Provider, MD  tobramycin-dexamethasone [facility]) ophthalmic ointment Place 1 application into the left eye as needed. As instrcuted by your opthalmologist 01/22/15  Yes Nishant Dhungel, MD  naproxen (NAPROSYN) 500 MG tablet Take 1 tablet (500 mg total) by mouth 3 (three) times daily with meals. Patient not taking: Reported on 01/23/2015 01/22/15   Louellen Molder, MD    Past Medical History  Diagnosis Date  . Hyperlipidemia   . Diverticulosis of colon   . GERD (gastroesophageal reflux disease)   . Glaucoma   . Kidney stones   . Hypertension   . MALIGNANT NEOPLASM OF UTERUS PART UNSPECIFIED 1998  . Thyroid disease     hypothyroidism    Past Surgical History  Procedure Laterality Date  . Detached retina repair os    . Abdominal hysterectomy  12/22/1996    Uterine Cancer Dr Phineas Real  . Release r long finger a1 pulley  01/23/2008    Dr Daylene Katayama  . Breast biopsy  1996    Benign  . Cholecystectomy    . Ruptured globe exploration and repair Left 01/20/2015    Procedure: REPAIR OF RUPTURED GLOBE and FACIAL LACERATIONS;  Surgeon: Danice Goltz, MD;  Location: [place];  Service: Ophthalmology;  Laterality: Left;  . Facial laceration repair Left 01/20/2015    Procedure: FACIAL LACERATION REPAIR;  Surgeon: Danice Goltz, MD;  Location: [facility];  Service: Ophthalmology;  Laterality: Left;    Family history: family history includes Cancer in her sister; Colon cancer in her maternal aunt; Heart disease (age of onset: 18) in her father. There is no history of Diabetes or Breast cancer.  Social History: Patient lives with her husband.  She is a non-smoker.           Physical Exam: BP 149/73 mmHg  Pulse 68  Temp(Src) 97.3 F (36.3 C) (Oral)  Resp 17  SpO2 97% General appearance: Thin elderly female, alert and in no acute distress.     Eyes: Left eye closed with ointment applied.  The right eye is inflamed. ENT: No nasal deformity, discharge, or epistaxis.  Oral mucosa dry.   Skin: Warm and dry.   Cardiac: RRR, nl S1-S2, no murmurs appreciated.  Capillary refill is brisk.   Respiratory: Normal respiratory rate and rhythm.  CTAB without rales or wheezes. Abdomen: Abdomen soft without rigidity.  No TTP. No ascites, distension.   Neuro: CN 2 minimally decreased in R eye.  CN 3-12 intact.  Muscle strength  globally 4/5 but symmetric.  Sensorium intact and responding to questions, attention normal.  Speech is fluent.  Moves all extremities equally and with normal coordination.   FTN testing limited by monocular vision, but normal with repeat. Pupils are 3 mm and reactive to 2 mm.  Extraocular movements are intact, without nystagmus.  There is no ataxia to tandem gait.  Romberg maneuver is negative for pathology.  The patient is oriented to time, place and person.  Speech is fluent.  Naming is grossly intact.  Recall, recent and remote, as well as general fund of knowledge seem within normal limits. Psych: Behavior appropriate.  Affect anxious.  No evidence of aural or visual hallucinations or delusions.       Labs on Admission:  The metabolic panel shows normal sodium, potassium, bicarbonate, and renal function. Urinalysis shows trace leukocytes only. The complete blood count shows no leukocytosis, anemia, thrombocytopenia.   Radiological Exams on Admission: Ct Head Wo Contrast 01/23/2015  "IMPRESSION: No acute intracranial hemorrhage. Age-related atrophy and chronic microvascular ischemic disease. If symptoms persist and there are no contraindications, MRI may provide better evaluation if clinically indicated. Left facial bone fractures and high  attenuating content within the left globe similar to prior study."       Assessment/Plan 1. Weakness, suspected from dehydration and surgery:  This is new.  Electrolytes normal.  No leukocytosis or pyuria.  Suspect this is within range of normal for age after surgery and with new monocular vision loss from trauma, especially given nausea and no PO intake, but will rule out evolving SDH with non-contrast CT.     -CT head  -IVF administered and UOP good -Repeat BMP -Ondansetron before meals and oral challenge again tomorrow   2. Ruptured globe:  Oral fluoroquinolone daily Acetaminophen or naproxen PRN for pain  3. Glaucoma in R eye:  Continue home brimon, Cosopt, and latanaprost R eye only  4. HTN:  Continue home HCTZ and atenolol  5. Hypothyroidism:  Continue hoem levothyroxine  6. Constipation:  Senokot PRN      DVT PPx: SCDs Diet: Regular Consultants: None Code Status: Full Family Communication: Husband, present at bedside  Medical decision making: What exists of the patient's previous chart was reviewed in depth and the case was discussed with Dr. [name] Resides. Patient seen 4:16 AM on 01/23/2015.  Disposition Plan:  I recommend admission to med surg observation status.  Status stable.  Anticipate fluid hydration and oral challenge tomorrow.  If patient is unable to eat breakfast or lunch tomorrow, IVF will have to be restarted and observation continued.      Edwin Dada [facility] Pager [phone]

## 2015-01-23 NOTE — Progress Notes (Signed)
The risk of infection at this point without PO antibiotics is likely low and given poor visual prognosis for her left eye, it is not worth continuing the PO fluoroquinolone if it is felt to be contributing to her nausea/weakness/inability to take PO. This antibiotic was a prophylactic measure for preventing intraocular infection following ruptured globe.  Christopher T. Manuella Ghazi, [place] Cell: [phone]

## 2015-01-23 NOTE — Progress Notes (Signed)
PROGRESS NOTE  Molly [name] [id number] DOB: 05/09/1929 DOA: 01/22/2015 PCP: Elsie Stain, MD  Brief History 82 year old female with a history of hypertension and hypothyroidism who was discharged on 01/22/2015 after repair of left globe injury from a mechanical fall. 3 days prior to this admission, the patient had a mechanical fall and hit her head against a car. She was found to have a left maxillary fracture and left globe rupture. Ophthalmology, Dr. Manuella Ghazi, repaired the ruptured globe and left eyelid laceration on 01/20/2015. The patient was sent home with home health physical therapy and moxifloxacin per ophthalmology recommendations. The patient was discharged on 01/22/2015. However, the patient and her husband stated that since her surgery she has had nausea with any attempt to eat resulting in poor oral intake. Over the course of the evening on 01/22/2015, her husband was concerned about increasing weakness and shakiness and dehydration.  He also felt that she was disoriented, anxious, and seeing colors on her clothing and wall that weren't there. As a result, the patient was brought to emergency for further evaluation. The patient was given 2 L in the emergency department, but continued to feel weak. Assessment/Plan: Generalized weakness -Given the patient's recent facial and head injury, suspected degree of post-concussive syndrome -UA without pyuria -check TSH -check B12 -CT brain negative -check mag -judicious IVF Post-concussion syndrome -this most likely explains her constellation of symptoms -MR brain for new complaints of L-facial numbness/tingling -PT evaluation -Symptomatic treatment Ruptured globe -s/p repair by Dr. Manuella Ghazi 1/18 -judicious pain control -continue quinolone per ophthalmology--D#3/7 Essential HTN -continue atenolol -hold HCTZ for now Hypothyroidism -continue synthroid Glaucoma -Continue eyedrops prior to admission   Family Communication:    Husband updated at beside Disposition Plan:   Home when medically stable        Procedures/Studies: Ct Head Wo Contrast  01/23/2015  CLINICAL DATA:  82 year old female status post fall not presenting with mental status change. EXAM: CT HEAD WITHOUT CONTRAST TECHNIQUE: Contiguous axial images were obtained from the base of the skull through the vertex without intravenous contrast. COMPARISON:  CT dated 01/20/2015 FINDINGS: There is diffuse cortical atrophy predominantly involving the frontal lobes. Periventricular and deep white matter hypodensities represent chronic microvascular ischemic changes. There is no intracranial hemorrhage. No mass effect or midline shift identified. There are fractures of the left zygomatic bone, as well as fractures of the anterior, and posterior lateral walls of the left maxillary sinus. There is partial opacification of the left maxillary sinus. High attenuating content within the left globe similar to prior study. The calvarium is intact. IMPRESSION: No acute intracranial hemorrhage. Age-related atrophy and chronic microvascular ischemic disease. If symptoms persist and there are no contraindications, MRI may provide better evaluation if clinically indicated. Left facial bone fractures and high attenuating content within the left globe similar to prior study. Electronically Signed   By: Anner Crete M.D.   On: 01/23/2015 05:19   Dg Bone Density  12/31/2014  EXAM: DUAL X-RAY ABSORPTIOMETRY (DXA) FOR BONE MINERAL DENSITY IMPRESSION: Referring Physician:  Tonia Ghent PATIENT: Name: Molly, [name] Patient ID: [id number] Birth Date: February 15, 1929 Height: 63.0 in. Sex: Female Measured: 12/31/2014 Weight: 140.0 lbs. Indications: Advanced Age, Bilateral Ovariectomy (65.51), Caucasian, Estrogen Deficient, Height Loss (781.91), History of Osteopenia, Hysterectomy, Postmenopausal, Synthroid, Secondary Osteoporosis Fractures: Treatments: Calcium (E943.0), Vitamin D  (E933.5) ASSESSMENT: The BMD measured at Femur Neck Left is 0.841 g/cm2 with a T-score of -1.4. This patient is  considered to be osteopenic according to [facility]) criteria. L-1, L-2 was excluded due to degenerative changes. Site Region Measured Date Measured Age YA BMD Significant CHANGE T-score DualFemur Neck Left 12/31/2014    83.2         -1.4    0.841 g/cm2 AP Spine  L3-L4     14/31/2014    83.2         -0.5    1.160 g/cm2 World Health Organization [facility]) criteria for post-menopausal, Caucasian Women: Normal       T-score at or above -1 SD Osteopenia   T-score between -1 and -2.5 SD Osteoporosis T-score at or below -2.5 SD RECOMMENDATION: [address] recommends that FDA-approved medical therapies be considered in postmenopausal women and men age 59 or older with a: 1. Hip or vertebral (clinical or morphometric) fracture. 2. T-score of <-2.5 at the spine or hip. 3. Ten-year fracture probability by FRAX of 3% or greater for hip fracture or 20% or greater for major osteoporotic fracture. All treatment decisions require clinical judgment and consideration of individual patient factors, including patient preferences, co-morbidities, previous drug use, risk factors not captured in the FRAX model (e.g. falls, vitamin D deficiency, increased bone turnover, interval significant decline in bone density) and possible under - or over-estimation of fracture risk by FRAX. All patients should ensure an adequate intake of dietary calcium (1200 mg/d) and vitamin D (800 IU daily) unless contraindicated. FOLLOW-UP: People with diagnosed cases of osteoporosis or at high risk for fracture should have regular bone mineral density tests. For patients eligible for Medicare, routine testing is allowed once every 2 years. The testing frequency can be increased to one year for patients who have rapidly progressing disease, those who are receiving or discontinuing medical therapy to restore bone  mass, or have additional risk factors. I have reviewed this report, and agree with the above findings. [facility] FRAX* 10-year Probability of Fracture Based on femoral neck BMD: DualFemur (Left) Major Osteoporotic Fracture: 13.4% Hip Fracture:                3.5% Population:                  [place] (Caucasian) Risk Factors:                Secondary Osteoporosis *FRAX is a [place] [employer] for Metabolic Bone Disease, a [place] (WHO) [facility]. ASSESSMENT: The probability of a major osteoporotic fracture is 13.4 % within the next ten years. The probability of a hip fracture is 3.5 % within the next ten years. Electronically Signed   By: Lajean Manes M.D.   On: 12/31/2014 16:32   Mm Screening Breast Tomo Bilateral  01/01/2015  CLINICAL DATA:  Screening. EXAM: DIGITAL SCREENING BILATERAL MAMMOGRAM WITH 3D TOMO WITH CAD COMPARISON:  Previous exam(s). ACR Breast Density Category b: There are scattered areas of fibroglandular density. FINDINGS: There are no findings suspicious for malignancy. Images were processed with CAD. IMPRESSION: No mammographic evidence of malignancy. A result letter of this screening mammogram will be mailed directly to the patient. RECOMMENDATION: Screening mammogram in one year. (Code:SM-B-01Y) BI-RADS CATEGORY  1: Negative. Electronically Signed   By: Lajean Manes M.D.   On: 01/01/2015 10:38   Ct Maxillofacial Wo Cm  01/20/2015  CLINICAL DATA:  82 year old female with left thigh trauma. EXAM: CT MAXILLOFACIAL WITHOUT CONTRAST TECHNIQUE: Multidetector CT imaging of the maxillofacial structures was  performed. Multiplanar CT image reconstructions were also generated. A small metallic BB was placed on the right temple in order to reliably differentiate right from left. COMPARISON:  None. FINDINGS: There is a minimally depressed fracture of the left zygomatic arch. There are displaced fractures of the  posterolateral wall of the left maxillary sinus. There is a mildly depressed fracture of the anterior wall of the left maxillary sinus. There is a mildly displaced fracture of the left orbital floor along the inferior orbital groove. There is a minimally displaced fracture of the lateral wall of the left orbit. A small fracture fragment noted abutting the lateral rectus muscle. Correlation with clinical exam is recommended to exclude ocular entrapment. There is fracture of the inferior orbital rim. No other acute fracture identified. The mandible and pterygoid plates are intact. There is high attenuating content within the left globe concerning for intra-ocular hemorrhage. There is mild enlargement of the left globe. Correlation with clinical exam recommended. There is scleral banding of the left globe. The retro-orbital fat are preserved. There is partial opacification of the maxillary sinuses bilaterally. High attenuating content within the left maxillary sinus compatible with hemosinus. The mastoid air cells are clear. No acute hemorrhage identified within the visualized brain. IMPRESSION: Left facial and orbital fractures as described. High attenuating content within the left globe most compatible with intra-articular hematoma. Clinical correlation and ophthalmology consult is advised. These results were called by telephone at the time of interpretation on 01/20/2015 at 10:20 pm to Dr. Davonna Belling , who verbally acknowledged these results. Electronically Signed   By: Anner Crete M.D.   On: 01/20/2015 22:25         Subjective: Patient complains of some nausea with dizziness and headache. Denies any fevers, chills, chest pain, shortness of breath, vomiting, diarrhea, abdominal pain, dysuria, hematuria. No rashes. Denies any neck pain. Denies any unusual rashes or synovitis.  Objective: Filed Vitals:   01/23/15 0045 01/23/15 0245 01/23/15 0300 01/23/15 0347  BP: 146/86 148/69 126/61 149/73    Pulse: 57 75 59 68  Temp:    97.3 F (36.3 C)  TempSrc:    Oral  Resp: 18 14 17 17   SpO2: 99% 99% 95% 97%    Intake/Output Summary (Last 24 hours) at 01/23/15 0851 Last data filed at 01/23/15 0107  Gross per 24 hour  Intake      0 ml  Output    250 ml  Net   -250 ml   Weight change:  Exam:   General:  Pt is alert, follows commands appropriately, not in acute distress  HEENT: No icterus, No thrush, No neck mass, mild left facial edema with sutures intact around the left globe. No drainage, erythema  Cardiovascular: RRR, S1/S2, no rubs, no gallops  Respiratory: CTA bilaterally, no wheezing, no crackles, no rhonchi  Abdomen: Soft/+BS, non tender, non distended, no guarding; no hepatomegaly  Extremities: No edema, No lymphangitis, No petechiae, No rashes, no synovitis; no cyanosis or clubbing  Data Reviewed: Basic Metabolic Panel:  Recent Labs Lab 01/20/15 2225 01/21/15 1040 01/22/15 0359 01/22/15 2236 01/23/15 0500  NA 139 136 135 136 141  K 2.8* 4.7 3.5 3.7 3.6  CL 101 100* 103 100* 108  CO2  --  27 24 23 25   GLUCOSE 179* 185* 131* 124* 131*  BUN 17 7 10 12 10   CREATININE 0.70 0.81 0.72 0.67 0.69  CALCIUM  --  8.7* 8.3* 9.5 8.3*  MG  --  1.9  --   --   --  Liver Function Tests: No results for input(s): AST, ALT, ALKPHOS, BILITOT, PROT, ALBUMIN in the last 168 hours. No results for input(s): LIPASE, AMYLASE in the last 168 hours. No results for input(s): AMMONIA in the last 168 hours. CBC:  Recent Labs Lab 01/20/15 2222 01/20/15 2225 01/22/15 0359 01/22/15 2236  WBC 9.0  --  6.3 6.4  NEUTROABS 5.4  --   --   --   HGB 13.9 15.3* 12.2 13.9  HCT 42.5 45.0 38.0 41.4  MCV 93.4  --  96.2 93.7  PLT 263  --  220 262   Cardiac Enzymes: No results for input(s): CKTOTAL, CKMB, CKMBINDEX, TROPONINI in the last 168 hours. BNP: Invalid input(s): POCBNP CBG: No results for input(s): GLUCAP in the last 168 hours.  No results found for this or any previous  visit (from the past 240 hour(s)).   Scheduled Meds: . atenolol  25 mg Oral Daily  . brimonidine  1 drop Right Eye BID  . cholecalciferol  2,000 Units Oral Daily  . dorzolamide-timolol  1 drop Right Eye BID  . hydrochlorothiazide  12.5 mg Oral Daily  . latanoprost  1 drop Right Eye QHS  . levofloxacin  750 mg Oral Daily  . levothyroxine  88 mcg Oral QAC breakfast   Continuous Infusions:    Rilley Poulter, DO  [facility] Pager [phone]  If 7PM-7AM, please contact night-coverage [email or web address] Password [email or web address] 01/23/2015, 8:51 AM   LOS: 0 days

## 2015-01-24 DIAGNOSIS — E038 Other specified hypothyroidism: Secondary | ICD-10-CM | POA: Diagnosis not present

## 2015-01-24 DIAGNOSIS — R531 Weakness: Secondary | ICD-10-CM | POA: Diagnosis not present

## 2015-01-24 DIAGNOSIS — E860 Dehydration: Secondary | ICD-10-CM | POA: Diagnosis not present

## 2015-01-24 DIAGNOSIS — I10 Essential (primary) hypertension: Secondary | ICD-10-CM | POA: Diagnosis not present

## 2015-01-24 LAB — BASIC METABOLIC PANEL
Anion gap: 8 (ref 5–15)
BUN: 7 mg/dL (ref 6–20)
CHLORIDE: 112 mmol/L — AB (ref 101–111)
CO2: 23 mmol/L (ref 22–32)
CREATININE: 0.68 mg/dL (ref 0.44–1.00)
Calcium: 8.9 mg/dL (ref 8.9–10.3)
GFR calc Af Amer: >60 mL/min (ref >60)
GFR calc non Af Amer: >60 mL/min (ref >60)
Glucose, Bld: 132 mg/dL — ABNORMAL HIGH (ref 65–99)
Potassium: 3.9 mmol/L (ref 3.5–5.1)
Sodium: 143 mmol/L (ref 135–145)

## 2015-01-24 LAB — MAGNESIUM: Magnesium: 1.9 mg/dL (ref 1.7–2.4)

## 2015-01-24 MED ORDER — HYDROCODONE-ACETAMINOPHEN 5-325 MG PO TABS: 1.0000 | ORAL_TABLET | ORAL | Status: DC | PRN

## 2015-01-24 NOTE — Progress Notes (Signed)
CM received call from MD to please arrange HHPT.  Pt previously setup with [facility]; CM called [facility] rep, Tiffany to notify of discharge for [facility].  No other CM needs were communicated.
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
## 2015-01-25 ENCOUNTER — Telehealth: Payer: Self-pay

## 2015-01-25 NOTE — Telephone Encounter (Signed)
Molly [name] with [facility] left v/m; pt was referred for home health skilled nursing, OT and PT after discharge from [facility]. Jacqlyn Larsen does not think pt needs skilled nursing but does need PT and OT. Molly [name] request cb.

## 2015-01-25 NOTE — Telephone Encounter (Signed)
Please give the order for PT and OT. Thanks.

## 2015-01-25 NOTE — Telephone Encounter (Signed)
Will defer to PCP

## 2015-01-26 ENCOUNTER — Telehealth: Payer: Self-pay | Admitting: *Deleted

## 2015-01-26 DIAGNOSIS — E039 Hypothyroidism, unspecified: Secondary | ICD-10-CM | POA: Diagnosis not present

## 2015-01-26 DIAGNOSIS — S0532XA Ocular laceration without prolapse or loss of intraocular tissue, left eye, initial encounter: Secondary | ICD-10-CM | POA: Diagnosis not present

## 2015-01-26 DIAGNOSIS — I10 Essential (primary) hypertension: Secondary | ICD-10-CM | POA: Diagnosis not present

## 2015-01-26 DIAGNOSIS — S01112D Laceration without foreign body of left eyelid and periocular area, subsequent encounter: Secondary | ICD-10-CM | POA: Diagnosis not present

## 2015-01-26 DIAGNOSIS — R531 Weakness: Secondary | ICD-10-CM | POA: Diagnosis not present

## 2015-01-26 DIAGNOSIS — H2102 Hyphema, left eye: Secondary | ICD-10-CM | POA: Diagnosis not present

## 2015-01-26 DIAGNOSIS — K219 Gastro-esophageal reflux disease without esophagitis: Secondary | ICD-10-CM | POA: Diagnosis not present

## 2015-01-26 DIAGNOSIS — H401112 Primary open-angle glaucoma, right eye, moderate stage: Secondary | ICD-10-CM | POA: Diagnosis not present

## 2015-01-26 DIAGNOSIS — E785 Hyperlipidemia, unspecified: Secondary | ICD-10-CM | POA: Diagnosis not present

## 2015-01-26 DIAGNOSIS — Z9889 Other specified postprocedural states: Secondary | ICD-10-CM | POA: Diagnosis not present

## 2015-01-26 DIAGNOSIS — S0532XD Ocular laceration without prolapse or loss of intraocular tissue, left eye, subsequent encounter: Secondary | ICD-10-CM | POA: Diagnosis not present

## 2015-01-26 DIAGNOSIS — X58XXXA Exposure to other specified factors, initial encounter: Secondary | ICD-10-CM | POA: Diagnosis not present

## 2015-01-26 DIAGNOSIS — Z8669 Personal history of other diseases of the nervous system and sense organs: Secondary | ICD-10-CM | POA: Diagnosis not present

## 2015-01-26 NOTE — Telephone Encounter (Signed)
Update from [place] at [facility]  Patient's husband is concerned that patient has not had a BM since 1/19.  She denies any discomfort or abdominal distention.  She is currently taking Norco as prescribed by hospitalist.  Faythe Ghee to use otc stool softeners?  Please advise.

## 2015-01-26 NOTE — Telephone Encounter (Signed)
Start colace 100mg  a day and add on miralax 1 dose per day, both OTC.  Thanks.

## 2015-01-26 NOTE — Telephone Encounter (Signed)
Left message on patient's voicemail to return call

## 2015-01-26 NOTE — Telephone Encounter (Signed)
Molly [name] returned call.  Orders given for PT/OT.

## 2015-01-26 NOTE — Telephone Encounter (Signed)
Husband advised. 

## 2015-01-26 NOTE — Telephone Encounter (Signed)
Transitional care call attempted.  Left message for patient to return call. 

## 2015-01-27 NOTE — Telephone Encounter (Signed)
Spouse called he stated Molly [name] is not any better and he did everything you told him to do yesterday.  She has not a bowel movement in a long time. She is in a lot of pain.

## 2015-01-27 NOTE — Telephone Encounter (Signed)
Patient's husband notified as instructed by telephone and verbalized understanding.

## 2015-01-27 NOTE — Telephone Encounter (Signed)
Transition Care Management Follow-up Telephone Call   Date discharged? 01/24/15   How have you been since you were released from the hospital? Weakness has improved, now having issues with hemorrhoids   Do you understand why you were in the hospital? yes   Do you understand the discharge instructions? yes   Where were you discharged to? home   Items Reviewed:  Medications reviewed: yes  Allergies reviewed: yes  Dietary changes reviewed: no  Referrals reviewed: no   Functional Questionnaire:   Activities of Daily Living (ADLs):   She states they are independent in the following: ambulation, bathing and hygiene, feeding, continence, grooming, toileting and dressing States they require assistance with the following: none   Any transportation issues/concerns?: no, husband will provide transportation   Any patient concerns? yes, continued rectal pain associated with hemorrhoids - discussed, will f/u at ov   Confirmed importance and date/time of follow-up visits scheduled yes, 02/02/15 @ 1130  Provider Appointment booked with G. Renford Dills, MD  Confirmed with patient if condition begins to worsen call PCP or go to the ER.  Patient was given the office number and encouraged to call back with question or concerns.  : yes

## 2015-01-27 NOTE — Telephone Encounter (Signed)
They can double up on the dosing of both- miralax BID and colace BID.  If severe pain or unable to pass gas, then to ER.   It may take another day to have effect from the medicine.

## 2015-01-27 NOTE — Telephone Encounter (Signed)
If she can't use prep H due to trouble with petroleum jelly, then okay to try otc plain hydrocortisone cream 1%.  That may help with irritation on the hemorrhoids.   Can try sitz baths also.  Thanks.

## 2015-01-27 NOTE — Telephone Encounter (Signed)
Patient's husband notified as instructed by telephone and verbalized understanding. Mr. Schalk stated that the main problem is hemorrhoids and severe pain when she goes to the bathroom. Patient has started passing a little stool.  Patient stated that she is allergic to petroleum jelly. . [facility]

## 2015-01-28 ENCOUNTER — Encounter: Payer: Self-pay | Admitting: Family Medicine

## 2015-01-28 ENCOUNTER — Ambulatory Visit (INDEPENDENT_AMBULATORY_CARE_PROVIDER_SITE_OTHER): Payer: PPO | Admitting: Family Medicine

## 2015-01-28 ENCOUNTER — Telehealth: Payer: Self-pay | Admitting: Family Medicine

## 2015-01-28 VITALS — BP 112/66 | HR 75 | Temp 98.6°F | Wt 141.5 lb

## 2015-01-28 DIAGNOSIS — K6289 Other specified diseases of anus and rectum: Secondary | ICD-10-CM | POA: Diagnosis not present

## 2015-01-28 MED ORDER — HYDROCODONE-ACETAMINOPHEN 5-325 MG PO TABS: 0.5000 | ORAL_TABLET | Freq: Four times a day (QID) | ORAL | Status: DC | PRN

## 2015-01-28 MED ORDER — LIDOCAINE 5 % EX OINT: TOPICAL_OINTMENT | CUTANEOUS | Status: DC

## 2015-01-28 NOTE — Patient Instructions (Signed)
Take 1 tylenol with either a half or whole vicodin for pain.  Continue miralax and the stool softeners.  Use a small amount of lidocaine on your rectum for the pain in the meantime.   Take care.  Glad to see you.

## 2015-01-28 NOTE — Telephone Encounter (Signed)
Patient called and said she'd really like to get an appointment with Dr.Duncan today. Patient tried to do everything that was suggested to her yesterday and it hasn't helped.  Patient said she doesn't want to go to the emergency room because they don't help. Please advise.

## 2015-01-28 NOTE — Telephone Encounter (Signed)
Scheduled at 2:45

## 2015-01-28 NOTE — Telephone Encounter (Signed)
Add her on when possible (2:45?). Thanks.

## 2015-01-28 NOTE — Progress Notes (Signed)
Pre visit review using our clinic review tool, if applicable. No additional management support is needed unless otherwise documented below in the visit note.  L facial fx and L eye/globe rupture repair noted.  Now home.  Not on vicodin for pain- never got rx filled.  Still with facial pain and L V2 numbness.  Now with pain with BMs, rectal pain.  On miralax and colace.  No help with rectal pain with topical hydrocortisone.  No blood in stool.  Rectal pain was the main issue for today.   Meds, vitals, and allergies reviewed.   ROS: See HPI.  Otherwise, noncontributory.  nad L facial injuries noted, with L eyelid changes noted.  Reports numbness in L V2 rrr Chaperoned exam.  No gross blood but mult nonthrombosed ext hemorrhoids with likely mult rect fissures.

## 2015-01-29 DIAGNOSIS — K6289 Other specified diseases of anus and rectum: Secondary | ICD-10-CM | POA: Insufficient documentation

## 2015-01-29 NOTE — Assessment & Plan Note (Signed)
She had intolerance not allergy to injected procaine but should be able to tolerate topical lidocaine in small amounts.  Reasonable to try.  D/w pt.  Continue bowel regimen and update me tomorrow as needed.   Also- can try vicodin for facial pain, with constipation cautions.  See AVS.  Okay for outpatient f/u.   App help of all involved in her care.

## 2015-02-01 ENCOUNTER — Telehealth: Payer: Self-pay | Admitting: *Deleted

## 2015-02-01 ENCOUNTER — Other Ambulatory Visit (INDEPENDENT_AMBULATORY_CARE_PROVIDER_SITE_OTHER): Payer: PPO

## 2015-02-01 DIAGNOSIS — E860 Dehydration: Secondary | ICD-10-CM

## 2015-02-01 NOTE — Telephone Encounter (Signed)
Patient's husband called stating that patient's bottom lip is red and swollen. Patient's husband stated that he is concerned about his wife and thinks that she may be dehydrated because she is not eating and drinking. Patient's husband stated that they just got back from the eye doctor and was informed that her eye has to come out.  Was advised that she has an appointment scheduled Thursday at [facility] Mr. Kawczynski stated that the eye doctor is concerned about her electrolytes and calcium level.  Mr. Rutherford wants to know what they should do?

## 2015-02-01 NOTE — Telephone Encounter (Signed)
Patient advised.  Lab appt scheduled.  

## 2015-02-01 NOTE — Telephone Encounter (Signed)
If clinically dehydrated and needing IVF, then to ER.  O/w okay to recheck labs here.  Order is in.  Encouraged sips of gatorade, etc in the meantime.   Hold HCTZ for 2 days.   Thanks.

## 2015-02-02 ENCOUNTER — Encounter: Payer: Self-pay | Admitting: Family Medicine

## 2015-02-02 ENCOUNTER — Telehealth: Payer: Self-pay | Admitting: *Deleted

## 2015-02-02 ENCOUNTER — Ambulatory Visit (INDEPENDENT_AMBULATORY_CARE_PROVIDER_SITE_OTHER): Payer: PPO | Admitting: Family Medicine

## 2015-02-02 VITALS — BP 112/78 | HR 65 | Temp 97.9°F | Wt 137.8 lb

## 2015-02-02 DIAGNOSIS — F0781 Postconcussional syndrome: Secondary | ICD-10-CM | POA: Diagnosis not present

## 2015-02-02 DIAGNOSIS — E860 Dehydration: Secondary | ICD-10-CM

## 2015-02-02 DIAGNOSIS — K6289 Other specified diseases of anus and rectum: Secondary | ICD-10-CM | POA: Diagnosis not present

## 2015-02-02 LAB — BASIC METABOLIC PANEL
BUN: 12 mg/dL (ref 6–23)
CALCIUM: 9.7 mg/dL (ref 8.4–10.5)
CO2: 31 mEq/L (ref 19–32)
CREATININE: 0.6 mg/dL (ref 0.40–1.20)
Chloride: 97 mEq/L (ref 96–112)
GFR: 101.4 mL/min (ref >60.00)
Glucose, Bld: 147 mg/dL — ABNORMAL HIGH (ref 70–99)
Potassium: 3.6 mEq/L (ref 3.5–5.1)
SODIUM: 136 meq/L (ref 135–145)

## 2015-02-02 MED ORDER — ONDANSETRON HCL 4 MG PO TABS: 4.0000 mg | ORAL_TABLET | Freq: Three times a day (TID) | ORAL | Status: DC | PRN

## 2015-02-02 NOTE — Assessment & Plan Note (Signed)
Lytes okay, continue sips of fluids and ADAT.  She agrees.

## 2015-02-02 NOTE — Progress Notes (Signed)
Pre visit review using our clinic review tool, if applicable. No additional management support is needed unless otherwise documented below in the visit note.  Less pain with BMs with lidocaine.  Last BM was the day before yesterday.   Facial pain, L sided, continues.  On prn vicodin, with some relief, so she can rest.   Still with some L V2 numbness.   Still with minimal appetite.  Taking some small amounts of soft foods in the meantime.  Taking fluids fairly well but still with some nausea.  No vomiting since night of surgery.  She feels better after a burp, ie relief from pressure.  She has been able to get some gatorade and jello down, ie able to tolerate that.  UOP x2 this AM, not dark but not clear.  Labs dw pt at [place].  Cr and lytes okay.   She has f/u with [facility] on 02/04/15.   Meds, vitals, and allergies reviewed.   ROS: See HPI.  Otherwise, noncontributory.  nad L eye closed.  Bruising resolving.  Speech at baseline Neck supple, no LA rrr ctab abd soft, not ttp, normal BS Ext w/o edema

## 2015-02-02 NOTE — Assessment & Plan Note (Signed)
Concussion dx d/w pt.   Still with some nausea, likely from concussion.  No other focal neuro sx except for the numbness that is expected on L V2.  D/w pt.  Pain/pain meds may also inc nausea.   Likely not with residual sx from anesthesia.   Okay to try zofran in the meantime.  Continue pain meds as is, prn.  She agrees.  Okay for outpatient f/u.  I'll await notes from [facility].  She agrees.

## 2015-02-02 NOTE — Telephone Encounter (Signed)
Submitted PA for Ondansetron thru [facility], awaiting response.

## 2015-02-02 NOTE — Assessment & Plan Note (Signed)
Improved, d/w pt.

## 2015-02-02 NOTE — Patient Instructions (Signed)
Your labs are fine.  Zofran for the nausea.  I'll await the notes from the eye clinic.  Take care.  Glad to see you.

## 2015-02-03 ENCOUNTER — Telehealth: Payer: Self-pay

## 2015-02-03 NOTE — Telephone Encounter (Signed)
Esther OT with [facility] left v/m; last week pts husband declined OT home health; pts husband declined OT home health services again; Mr Guo said pt is not ready for OT. Sherlynn Stalls wanted Dr Damita Dunnings to know that she will not be seeing pt for home health OT.

## 2015-02-03 NOTE — Telephone Encounter (Signed)
Received additional paperwork from [facility] to complete for PA.  Placed on your desk.

## 2015-02-04 DIAGNOSIS — H2102 Hyphema, left eye: Secondary | ICD-10-CM | POA: Diagnosis not present

## 2015-02-04 DIAGNOSIS — Z79899 Other long term (current) drug therapy: Secondary | ICD-10-CM | POA: Diagnosis not present

## 2015-02-04 DIAGNOSIS — R531 Weakness: Secondary | ICD-10-CM

## 2015-02-04 DIAGNOSIS — Z7982 Long term (current) use of aspirin: Secondary | ICD-10-CM | POA: Diagnosis not present

## 2015-02-04 DIAGNOSIS — H5440 Blindness, one eye, unspecified eye: Secondary | ICD-10-CM | POA: Diagnosis not present

## 2015-02-04 DIAGNOSIS — H5710 Ocular pain, unspecified eye: Secondary | ICD-10-CM | POA: Diagnosis not present

## 2015-02-04 DIAGNOSIS — S0532XA Ocular laceration without prolapse or loss of intraocular tissue, left eye, initial encounter: Secondary | ICD-10-CM | POA: Diagnosis not present

## 2015-02-04 DIAGNOSIS — I10 Essential (primary) hypertension: Secondary | ICD-10-CM

## 2015-02-04 DIAGNOSIS — S01112D Laceration without foreign body of left eyelid and periocular area, subsequent encounter: Secondary | ICD-10-CM

## 2015-02-04 DIAGNOSIS — S02402D Zygomatic fracture, unspecified, subsequent encounter for fracture with routine healing: Secondary | ICD-10-CM | POA: Diagnosis not present

## 2015-02-04 DIAGNOSIS — H401133 Primary open-angle glaucoma, bilateral, severe stage: Secondary | ICD-10-CM | POA: Diagnosis not present

## 2015-02-04 DIAGNOSIS — X58XXXD Exposure to other specified factors, subsequent encounter: Secondary | ICD-10-CM | POA: Diagnosis not present

## 2015-02-04 DIAGNOSIS — S02402A Zygomatic fracture, unspecified, initial encounter for closed fracture: Secondary | ICD-10-CM | POA: Diagnosis not present

## 2015-02-04 DIAGNOSIS — S0532XD Ocular laceration without prolapse or loss of intraocular tissue, left eye, subsequent encounter: Secondary | ICD-10-CM

## 2015-02-04 NOTE — Telephone Encounter (Signed)
PA done, please send in.  Thanks.

## 2015-02-04 NOTE — Telephone Encounter (Signed)
Molly [name] with [facility] request cb about ondansetron. Ref # [id number].

## 2015-02-04 NOTE — Telephone Encounter (Signed)
Noted  

## 2015-02-04 NOTE — Telephone Encounter (Signed)
Faxed form.  Approval given.  Patient and pharmacy advised.

## 2015-02-05 ENCOUNTER — Other Ambulatory Visit: Payer: Self-pay | Admitting: *Deleted

## 2015-02-05 MED ORDER — HYDROCODONE-ACETAMINOPHEN 5-325 MG PO TABS: 0.5000 | ORAL_TABLET | Freq: Four times a day (QID) | ORAL | Status: DC | PRN

## 2015-02-05 NOTE — Telephone Encounter (Signed)
Pt's husband called Triage, he said pt's surgery was put off for now and she has a f/u appt with surgeon on 02/11/15 and he thinks they will have a surgery date at that appt., but pt only has 5 pills left and husband is requesting a refill of med, last refilled on 01/28/15 #30 with 0 refills, husband request call back today

## 2015-02-05 NOTE — Telephone Encounter (Signed)
I'm just getting to this.  Printed.   Can they get it filled at [facility]?  I'll take it there.

## 2015-02-08 NOTE — Telephone Encounter (Signed)
Contacted husband and he came by the office and I met him at the front door with the Rx and he signed for it.

## 2015-02-11 DIAGNOSIS — S02402A Zygomatic fracture, unspecified, initial encounter for closed fracture: Secondary | ICD-10-CM | POA: Diagnosis not present

## 2015-02-11 DIAGNOSIS — S0512XA Contusion of eyeball and orbital tissues, left eye, initial encounter: Secondary | ICD-10-CM | POA: Diagnosis not present

## 2015-02-11 DIAGNOSIS — S0532XA Ocular laceration without prolapse or loss of intraocular tissue, left eye, initial encounter: Secondary | ICD-10-CM | POA: Diagnosis not present

## 2015-02-11 DIAGNOSIS — H5442 Blindness, left eye, normal vision right eye: Secondary | ICD-10-CM | POA: Diagnosis not present

## 2015-02-11 DIAGNOSIS — Z9889 Other specified postprocedural states: Secondary | ICD-10-CM | POA: Diagnosis not present

## 2015-02-11 DIAGNOSIS — H5710 Ocular pain, unspecified eye: Secondary | ICD-10-CM | POA: Diagnosis not present

## 2015-02-11 DIAGNOSIS — H2102 Hyphema, left eye: Secondary | ICD-10-CM | POA: Diagnosis not present

## 2015-02-11 DIAGNOSIS — X58XXXA Exposure to other specified factors, initial encounter: Secondary | ICD-10-CM | POA: Diagnosis not present

## 2015-02-11 DIAGNOSIS — H33003 Unspecified retinal detachment with retinal break, bilateral: Secondary | ICD-10-CM | POA: Diagnosis not present

## 2015-02-11 DIAGNOSIS — Z7982 Long term (current) use of aspirin: Secondary | ICD-10-CM | POA: Diagnosis not present

## 2015-02-12 DIAGNOSIS — R531 Weakness: Secondary | ICD-10-CM | POA: Diagnosis not present

## 2015-02-12 DIAGNOSIS — E785 Hyperlipidemia, unspecified: Secondary | ICD-10-CM | POA: Diagnosis not present

## 2015-02-12 DIAGNOSIS — I10 Essential (primary) hypertension: Secondary | ICD-10-CM | POA: Diagnosis not present

## 2015-02-12 DIAGNOSIS — S0532XD Ocular laceration without prolapse or loss of intraocular tissue, left eye, subsequent encounter: Secondary | ICD-10-CM | POA: Diagnosis not present

## 2015-02-12 DIAGNOSIS — S01112D Laceration without foreign body of left eyelid and periocular area, subsequent encounter: Secondary | ICD-10-CM | POA: Diagnosis not present

## 2015-02-12 DIAGNOSIS — E039 Hypothyroidism, unspecified: Secondary | ICD-10-CM | POA: Diagnosis not present

## 2015-02-12 DIAGNOSIS — K219 Gastro-esophageal reflux disease without esophagitis: Secondary | ICD-10-CM | POA: Diagnosis not present

## 2015-02-17 DIAGNOSIS — Z91040 Latex allergy status: Secondary | ICD-10-CM | POA: Diagnosis not present

## 2015-02-17 DIAGNOSIS — H1189 Other specified disorders of conjunctiva: Secondary | ICD-10-CM | POA: Diagnosis not present

## 2015-02-17 DIAGNOSIS — Z882 Allergy status to sulfonamides status: Secondary | ICD-10-CM | POA: Diagnosis not present

## 2015-02-17 DIAGNOSIS — I10 Essential (primary) hypertension: Secondary | ICD-10-CM | POA: Diagnosis not present

## 2015-02-17 DIAGNOSIS — H401123 Primary open-angle glaucoma, left eye, severe stage: Secondary | ICD-10-CM | POA: Diagnosis not present

## 2015-02-17 DIAGNOSIS — E039 Hypothyroidism, unspecified: Secondary | ICD-10-CM | POA: Diagnosis not present

## 2015-02-17 DIAGNOSIS — Z9842 Cataract extraction status, left eye: Secondary | ICD-10-CM | POA: Diagnosis not present

## 2015-02-17 DIAGNOSIS — Z83518 Family history of other specified eye disorder: Secondary | ICD-10-CM | POA: Diagnosis not present

## 2015-02-17 DIAGNOSIS — Z885 Allergy status to narcotic agent status: Secondary | ICD-10-CM | POA: Diagnosis not present

## 2015-02-17 DIAGNOSIS — Z884 Allergy status to anesthetic agent status: Secondary | ICD-10-CM | POA: Diagnosis not present

## 2015-02-17 DIAGNOSIS — Z9841 Cataract extraction status, right eye: Secondary | ICD-10-CM | POA: Diagnosis not present

## 2015-02-17 DIAGNOSIS — Z888 Allergy status to other drugs, medicaments and biological substances status: Secondary | ICD-10-CM | POA: Diagnosis not present

## 2015-02-17 DIAGNOSIS — H1820 Unspecified corneal edema: Secondary | ICD-10-CM | POA: Diagnosis not present

## 2015-02-17 DIAGNOSIS — M19049 Primary osteoarthritis, unspecified hand: Secondary | ICD-10-CM | POA: Diagnosis not present

## 2015-02-17 DIAGNOSIS — H5442 Blindness, left eye, normal vision right eye: Secondary | ICD-10-CM | POA: Diagnosis not present

## 2015-02-17 DIAGNOSIS — H5712 Ocular pain, left eye: Secondary | ICD-10-CM | POA: Diagnosis not present

## 2015-02-17 DIAGNOSIS — Z881 Allergy status to other antibiotic agents status: Secondary | ICD-10-CM | POA: Diagnosis not present

## 2015-02-17 DIAGNOSIS — Z7982 Long term (current) use of aspirin: Secondary | ICD-10-CM | POA: Diagnosis not present

## 2015-02-17 DIAGNOSIS — Z961 Presence of intraocular lens: Secondary | ICD-10-CM | POA: Diagnosis not present

## 2015-02-17 DIAGNOSIS — H401112 Primary open-angle glaucoma, right eye, moderate stage: Secondary | ICD-10-CM | POA: Diagnosis not present

## 2015-02-17 DIAGNOSIS — Z79899 Other long term (current) drug therapy: Secondary | ICD-10-CM | POA: Diagnosis not present

## 2015-02-17 DIAGNOSIS — Z83511 Family history of glaucoma: Secondary | ICD-10-CM | POA: Diagnosis not present

## 2015-02-23 DIAGNOSIS — Z79899 Other long term (current) drug therapy: Secondary | ICD-10-CM | POA: Diagnosis not present

## 2015-02-23 DIAGNOSIS — Z9889 Other specified postprocedural states: Secondary | ICD-10-CM | POA: Diagnosis not present

## 2015-02-23 DIAGNOSIS — Z7982 Long term (current) use of aspirin: Secondary | ICD-10-CM | POA: Diagnosis not present

## 2015-02-23 DIAGNOSIS — Z48810 Encounter for surgical aftercare following surgery on the sense organs: Secondary | ICD-10-CM | POA: Diagnosis not present

## 2015-02-25 DIAGNOSIS — Z9841 Cataract extraction status, right eye: Secondary | ICD-10-CM | POA: Diagnosis not present

## 2015-02-25 DIAGNOSIS — Z9889 Other specified postprocedural states: Secondary | ICD-10-CM | POA: Diagnosis not present

## 2015-02-25 DIAGNOSIS — Z882 Allergy status to sulfonamides status: Secondary | ICD-10-CM | POA: Diagnosis not present

## 2015-02-25 DIAGNOSIS — Z48810 Encounter for surgical aftercare following surgery on the sense organs: Secondary | ICD-10-CM | POA: Diagnosis not present

## 2015-02-25 DIAGNOSIS — Z7982 Long term (current) use of aspirin: Secondary | ICD-10-CM | POA: Diagnosis not present

## 2015-02-25 DIAGNOSIS — Z888 Allergy status to other drugs, medicaments and biological substances status: Secondary | ICD-10-CM | POA: Diagnosis not present

## 2015-02-25 DIAGNOSIS — Z881 Allergy status to other antibiotic agents status: Secondary | ICD-10-CM | POA: Diagnosis not present

## 2015-02-25 DIAGNOSIS — Z9842 Cataract extraction status, left eye: Secondary | ICD-10-CM | POA: Diagnosis not present

## 2015-02-25 DIAGNOSIS — Z885 Allergy status to narcotic agent status: Secondary | ICD-10-CM | POA: Diagnosis not present

## 2015-02-25 DIAGNOSIS — Z961 Presence of intraocular lens: Secondary | ICD-10-CM | POA: Diagnosis not present

## 2015-02-25 DIAGNOSIS — Z79899 Other long term (current) drug therapy: Secondary | ICD-10-CM | POA: Diagnosis not present

## 2015-03-18 DIAGNOSIS — M4726 Other spondylosis with radiculopathy, lumbar region: Secondary | ICD-10-CM | POA: Diagnosis not present

## 2015-03-18 DIAGNOSIS — M5136 Other intervertebral disc degeneration, lumbar region: Secondary | ICD-10-CM | POA: Diagnosis not present

## 2015-03-23 DIAGNOSIS — Z9842 Cataract extraction status, left eye: Secondary | ICD-10-CM | POA: Diagnosis not present

## 2015-03-23 DIAGNOSIS — Z882 Allergy status to sulfonamides status: Secondary | ICD-10-CM | POA: Diagnosis not present

## 2015-03-23 DIAGNOSIS — Z888 Allergy status to other drugs, medicaments and biological substances status: Secondary | ICD-10-CM | POA: Diagnosis not present

## 2015-03-23 DIAGNOSIS — Z961 Presence of intraocular lens: Secondary | ICD-10-CM | POA: Diagnosis not present

## 2015-03-23 DIAGNOSIS — Z91048 Other nonmedicinal substance allergy status: Secondary | ICD-10-CM | POA: Diagnosis not present

## 2015-03-23 DIAGNOSIS — Z9841 Cataract extraction status, right eye: Secondary | ICD-10-CM | POA: Diagnosis not present

## 2015-03-23 DIAGNOSIS — Z881 Allergy status to other antibiotic agents status: Secondary | ICD-10-CM | POA: Diagnosis not present

## 2015-03-23 DIAGNOSIS — Z48810 Encounter for surgical aftercare following surgery on the sense organs: Secondary | ICD-10-CM | POA: Diagnosis not present

## 2015-03-23 DIAGNOSIS — Z9889 Other specified postprocedural states: Secondary | ICD-10-CM | POA: Diagnosis not present

## 2015-03-23 DIAGNOSIS — Z885 Allergy status to narcotic agent status: Secondary | ICD-10-CM | POA: Diagnosis not present

## 2015-04-08 DIAGNOSIS — Z4422 Encounter for fitting and adjustment of artificial left eye: Secondary | ICD-10-CM | POA: Diagnosis not present

## 2015-04-13 DIAGNOSIS — H401123 Primary open-angle glaucoma, left eye, severe stage: Secondary | ICD-10-CM | POA: Diagnosis not present

## 2015-04-19 DIAGNOSIS — Z0100 Encounter for examination of eyes and vision without abnormal findings: Secondary | ICD-10-CM | POA: Diagnosis not present

## 2015-04-19 DIAGNOSIS — Z970 Presence of artificial eye: Secondary | ICD-10-CM | POA: Diagnosis not present

## 2015-04-19 DIAGNOSIS — H401113 Primary open-angle glaucoma, right eye, severe stage: Secondary | ICD-10-CM | POA: Diagnosis not present

## 2015-05-11 DIAGNOSIS — Z881 Allergy status to other antibiotic agents status: Secondary | ICD-10-CM | POA: Diagnosis not present

## 2015-05-11 DIAGNOSIS — Z882 Allergy status to sulfonamides status: Secondary | ICD-10-CM | POA: Diagnosis not present

## 2015-05-11 DIAGNOSIS — Z9842 Cataract extraction status, left eye: Secondary | ICD-10-CM | POA: Diagnosis not present

## 2015-05-11 DIAGNOSIS — H5000 Unspecified esotropia: Secondary | ICD-10-CM | POA: Diagnosis not present

## 2015-05-11 DIAGNOSIS — Z961 Presence of intraocular lens: Secondary | ICD-10-CM | POA: Diagnosis not present

## 2015-05-11 DIAGNOSIS — Z48810 Encounter for surgical aftercare following surgery on the sense organs: Secondary | ICD-10-CM | POA: Diagnosis not present

## 2015-05-11 DIAGNOSIS — Z885 Allergy status to narcotic agent status: Secondary | ICD-10-CM | POA: Diagnosis not present

## 2015-05-11 DIAGNOSIS — Z888 Allergy status to other drugs, medicaments and biological substances status: Secondary | ICD-10-CM | POA: Diagnosis not present

## 2015-05-11 DIAGNOSIS — Z9841 Cataract extraction status, right eye: Secondary | ICD-10-CM | POA: Diagnosis not present

## 2015-05-11 DIAGNOSIS — H02409 Unspecified ptosis of unspecified eyelid: Secondary | ICD-10-CM | POA: Diagnosis not present

## 2015-06-07 DIAGNOSIS — H401113 Primary open-angle glaucoma, right eye, severe stage: Secondary | ICD-10-CM | POA: Diagnosis not present

## 2015-06-07 DIAGNOSIS — H401123 Primary open-angle glaucoma, left eye, severe stage: Secondary | ICD-10-CM | POA: Diagnosis not present

## 2015-06-18 DIAGNOSIS — R1031 Right lower quadrant pain: Secondary | ICD-10-CM | POA: Diagnosis not present

## 2015-06-18 DIAGNOSIS — M5416 Radiculopathy, lumbar region: Secondary | ICD-10-CM | POA: Diagnosis not present

## 2015-06-18 DIAGNOSIS — M4726 Other spondylosis with radiculopathy, lumbar region: Secondary | ICD-10-CM | POA: Diagnosis not present

## 2015-06-18 DIAGNOSIS — M5136 Other intervertebral disc degeneration, lumbar region: Secondary | ICD-10-CM | POA: Diagnosis not present

## 2015-06-23 DIAGNOSIS — M5136 Other intervertebral disc degeneration, lumbar region: Secondary | ICD-10-CM | POA: Diagnosis not present

## 2015-06-23 DIAGNOSIS — M4726 Other spondylosis with radiculopathy, lumbar region: Secondary | ICD-10-CM | POA: Diagnosis not present

## 2015-06-23 DIAGNOSIS — M4806 Spinal stenosis, lumbar region: Secondary | ICD-10-CM | POA: Diagnosis not present

## 2015-08-09 ENCOUNTER — Encounter: Payer: Self-pay | Admitting: Internal Medicine

## 2015-08-09 ENCOUNTER — Ambulatory Visit (INDEPENDENT_AMBULATORY_CARE_PROVIDER_SITE_OTHER): Payer: PPO | Admitting: Internal Medicine

## 2015-08-09 VITALS — BP 144/84 | HR 64 | Temp 97.4°F | Wt 132.8 lb

## 2015-08-09 DIAGNOSIS — R300 Dysuria: Secondary | ICD-10-CM | POA: Diagnosis not present

## 2015-08-09 DIAGNOSIS — R350 Frequency of micturition: Secondary | ICD-10-CM

## 2015-08-09 DIAGNOSIS — N390 Urinary tract infection, site not specified: Secondary | ICD-10-CM | POA: Diagnosis not present

## 2015-08-09 LAB — POC URINALSYSI DIPSTICK (AUTOMATED)
GLUCOSE UA: NEGATIVE
KETONES UA: NEGATIVE
Nitrite, UA: POSITIVE
Spec Grav, UA: 1.02
Urobilinogen, UA: 1
pH, UA: 6

## 2015-08-09 MED ORDER — CIPROFLOXACIN HCL 250 MG PO TABS: 250.0000 mg | ORAL_TABLET | Freq: Two times a day (BID) | ORAL | 0 refills | Status: AC

## 2015-08-09 NOTE — Patient Instructions (Signed)

## 2015-08-09 NOTE — Addendum Note (Signed)
Addended by: Lurlean Nanny on: 08/09/2015 11:30 AM   Modules accepted: Orders

## 2015-08-09 NOTE — Progress Notes (Signed)
HPI  Pt presents to the clinic today with c/o urinary frequency and dysuria. This started 2 days ago. She does have some associated bladder pressure. She denies fever, chills, nausea or low back pain. She denies blood in the stool or vaginal complaints. She has tried Engineer, site with some relief. She does have a history of a cystocele.   Review of Systems  Past Medical History:  Diagnosis Date  . Diverticulosis of colon   . GERD (gastroesophageal reflux disease)   . Glaucoma   . Hyperlipidemia   . Hypertension   . Kidney stones   . MALIGNANT NEOPLASM OF UTERUS PART UNSPECIFIED 1998  . Thyroid disease    hypothyroidism    Family History  Problem Relation Age of Onset  . Heart disease Father 26    MI  . Cancer Sister     Cervical Cancer  . Diabetes Neg Hx   . Breast cancer Neg Hx   . Colon cancer Maternal Aunt     Social History   Social History  . Marital status: Married    Spouse name: Trilby Drummer  . Number of children: 3  . Years of education: N/A   Occupational History  . Realtor     Retired  . Bookkeeper      Retired    Social History Main Topics  . Smoking status: Never Smoker  . Smokeless tobacco: Never Used  . Alcohol use No  . Drug use: No  . Sexual activity: Not on file   Other Topics Concern  . Not on file   Social History Narrative   Married 1952   Retired Network engineer and bookkeeper    Allergies  Allergen Reactions  . Erythromycin     Intolerant  . Ezetimibe-Simvastatin     REACTION: MUSCLE ACHES AND PAINS.   Clancy Gourd [Nitrofurantoin Macrocrystal] Nausea Only  . Morphine     Chest pain  . Nitrofurantoin     Unrecalled reaction  . Petroleum Jelly [Skin Protectants, Misc.] Other (See Comments)    Skin irritation.   . Procaine Hcl     Can tolerate lidocaine  . Simvastatin     myalgias  . Tape     Rash  . Adhesive [Tape] Rash  . Morphine And Related Palpitations    Constitutional: Denies fever, malaise, fatigue, headache or abrupt  weight changes.   GU: Pt reports frequency and pain with urination. Denies burning sensation, blood in urine, odor or discharge. Skin: Denies redness, rashes, lesions or ulcercations.   No other specific complaints in a complete review of systems (except as listed in HPI above).    Objective:   Physical Exam  BP (!) 144/84   Pulse 64   Temp 97.4 F (36.3 C) (Oral)   Wt 132 lb 12 oz (60.2 kg)   SpO2 97%   BMI 23.52 kg/m  Wt Readings from Last 3 Encounters:  08/09/15 132 lb 12 oz (60.2 kg)  02/02/15 137 lb 12 oz (62.5 kg)  01/28/15 141 lb 8 oz (64.2 kg)    General: Appears her stated age, well developed, well nourished in NAD. Cardiovascular: Normal rate and rhythm. S1,S2 noted.   Pulmonary/Chest: Normal effort and positive vesicular breath sounds. No respiratory distress. No wheezes, rales or ronchi noted.  Abdomen: Soft and nontender. Normal bowel sounds. No distention or masses noted.   No CVA tenderness.      Assessment & Plan:  Frequency, Dysuria secondary to UTI:  Urinalysis: 3+ leuks, 3+ blood Will  send urine culture eRx sent if for Cipro 250 mg BID x 5 days OK to take Uristat OTC Drink plenty of fluids  RTC as needed or if symptoms persist. Webb Silversmith, NP

## 2015-08-09 NOTE — Progress Notes (Signed)
Pre visit review using our clinic review tool, if applicable. No additional management support is needed unless otherwise documented below in the visit note. 

## 2015-08-11 LAB — URINE CULTURE: Colony Count: >=100000

## 2015-09-03 ENCOUNTER — Other Ambulatory Visit: Payer: Self-pay | Admitting: Family Medicine

## 2015-09-29 DIAGNOSIS — D1801 Hemangioma of skin and subcutaneous tissue: Secondary | ICD-10-CM | POA: Diagnosis not present

## 2015-09-29 DIAGNOSIS — L918 Other hypertrophic disorders of the skin: Secondary | ICD-10-CM | POA: Diagnosis not present

## 2015-09-29 DIAGNOSIS — Z85828 Personal history of other malignant neoplasm of skin: Secondary | ICD-10-CM | POA: Diagnosis not present

## 2015-09-29 DIAGNOSIS — L821 Other seborrheic keratosis: Secondary | ICD-10-CM | POA: Diagnosis not present

## 2015-09-29 DIAGNOSIS — D2262 Melanocytic nevi of left upper limb, including shoulder: Secondary | ICD-10-CM | POA: Diagnosis not present

## 2015-09-30 DIAGNOSIS — M5136 Other intervertebral disc degeneration, lumbar region: Secondary | ICD-10-CM | POA: Diagnosis not present

## 2015-09-30 DIAGNOSIS — M5416 Radiculopathy, lumbar region: Secondary | ICD-10-CM | POA: Diagnosis not present

## 2015-09-30 DIAGNOSIS — M4806 Spinal stenosis, lumbar region: Secondary | ICD-10-CM | POA: Diagnosis not present

## 2015-09-30 DIAGNOSIS — M4726 Other spondylosis with radiculopathy, lumbar region: Secondary | ICD-10-CM | POA: Diagnosis not present

## 2015-10-11 DIAGNOSIS — H401113 Primary open-angle glaucoma, right eye, severe stage: Secondary | ICD-10-CM | POA: Diagnosis not present

## 2015-10-11 DIAGNOSIS — H401123 Primary open-angle glaucoma, left eye, severe stage: Secondary | ICD-10-CM | POA: Diagnosis not present

## 2015-10-12 DIAGNOSIS — H02402 Unspecified ptosis of left eyelid: Secondary | ICD-10-CM | POA: Diagnosis not present

## 2015-10-12 DIAGNOSIS — Z888 Allergy status to other drugs, medicaments and biological substances status: Secondary | ICD-10-CM | POA: Diagnosis not present

## 2015-10-12 DIAGNOSIS — Z79899 Other long term (current) drug therapy: Secondary | ICD-10-CM | POA: Diagnosis not present

## 2015-10-12 DIAGNOSIS — Z885 Allergy status to narcotic agent status: Secondary | ICD-10-CM | POA: Diagnosis not present

## 2015-10-12 DIAGNOSIS — Z961 Presence of intraocular lens: Secondary | ICD-10-CM | POA: Diagnosis not present

## 2015-10-12 DIAGNOSIS — Q111 Other anophthalmos: Secondary | ICD-10-CM | POA: Diagnosis not present

## 2015-10-12 DIAGNOSIS — Z881 Allergy status to other antibiotic agents status: Secondary | ICD-10-CM | POA: Diagnosis not present

## 2015-10-12 DIAGNOSIS — Z882 Allergy status to sulfonamides status: Secondary | ICD-10-CM | POA: Diagnosis not present

## 2015-10-12 DIAGNOSIS — Z9842 Cataract extraction status, left eye: Secondary | ICD-10-CM | POA: Diagnosis not present

## 2015-10-12 DIAGNOSIS — Z9841 Cataract extraction status, right eye: Secondary | ICD-10-CM | POA: Diagnosis not present

## 2015-10-12 DIAGNOSIS — Z7982 Long term (current) use of aspirin: Secondary | ICD-10-CM | POA: Diagnosis not present

## 2015-10-19 ENCOUNTER — Ambulatory Visit (INDEPENDENT_AMBULATORY_CARE_PROVIDER_SITE_OTHER): Payer: PPO

## 2015-10-19 DIAGNOSIS — Z23 Encounter for immunization: Secondary | ICD-10-CM

## 2015-11-23 ENCOUNTER — Other Ambulatory Visit: Payer: Self-pay | Admitting: Family Medicine

## 2015-11-23 ENCOUNTER — Other Ambulatory Visit (INDEPENDENT_AMBULATORY_CARE_PROVIDER_SITE_OTHER): Payer: PPO

## 2015-11-23 DIAGNOSIS — E559 Vitamin D deficiency, unspecified: Secondary | ICD-10-CM

## 2015-11-23 DIAGNOSIS — I10 Essential (primary) hypertension: Secondary | ICD-10-CM

## 2015-11-23 DIAGNOSIS — E038 Other specified hypothyroidism: Secondary | ICD-10-CM | POA: Diagnosis not present

## 2015-11-23 LAB — LIPID PANEL
CHOL/HDL RATIO: 4
Cholesterol: 275 mg/dL — ABNORMAL HIGH (ref 0–200)
HDL: 69.1 mg/dL (ref >39.00)
LDL CALC: 179 mg/dL — AB (ref 0–99)
NONHDL: 205.5
TRIGLYCERIDES: 133 mg/dL (ref 0.0–149.0)
VLDL: 26.6 mg/dL (ref 0.0–40.0)

## 2015-11-23 LAB — COMPREHENSIVE METABOLIC PANEL
ALT: 13 U/L (ref 0–35)
AST: 13 U/L (ref 0–37)
Albumin: 3.8 g/dL (ref 3.5–5.2)
Alkaline Phosphatase: 58 U/L (ref 39–117)
BUN: 15 mg/dL (ref 6–23)
CALCIUM: 9.5 mg/dL (ref 8.4–10.5)
CHLORIDE: 105 meq/L (ref 96–112)
CO2: 32 meq/L (ref 19–32)
Creatinine, Ser: 0.7 mg/dL (ref 0.40–1.20)
GFR: 84.71 mL/min (ref >60.00)
GLUCOSE: 98 mg/dL (ref 70–99)
Potassium: 3.9 mEq/L (ref 3.5–5.1)
SODIUM: 143 meq/L (ref 135–145)
Total Bilirubin: 0.6 mg/dL (ref 0.2–1.2)
Total Protein: 6.8 g/dL (ref 6.0–8.3)

## 2015-11-23 LAB — TSH: TSH: 1.16 u[IU]/mL (ref 0.35–4.50)

## 2015-11-23 LAB — VITAMIN D 25 HYDROXY (VIT D DEFICIENCY, FRACTURES): VITD: 27.12 ng/mL — ABNORMAL LOW (ref 30.00–100.00)

## 2015-12-02 ENCOUNTER — Encounter: Payer: PPO | Admitting: Family Medicine

## 2015-12-03 ENCOUNTER — Encounter: Payer: Self-pay | Admitting: Family Medicine

## 2015-12-03 ENCOUNTER — Ambulatory Visit (INDEPENDENT_AMBULATORY_CARE_PROVIDER_SITE_OTHER): Payer: PPO | Admitting: Family Medicine

## 2015-12-03 VITALS — BP 140/60 | HR 63 | Temp 98.6°F | Ht 63.0 in | Wt 138.0 lb

## 2015-12-03 DIAGNOSIS — I10 Essential (primary) hypertension: Secondary | ICD-10-CM | POA: Diagnosis not present

## 2015-12-03 DIAGNOSIS — S0532XD Ocular laceration without prolapse or loss of intraocular tissue, left eye, subsequent encounter: Secondary | ICD-10-CM

## 2015-12-03 DIAGNOSIS — E782 Mixed hyperlipidemia: Secondary | ICD-10-CM | POA: Diagnosis not present

## 2015-12-03 DIAGNOSIS — E038 Other specified hypothyroidism: Secondary | ICD-10-CM | POA: Diagnosis not present

## 2015-12-03 DIAGNOSIS — Z0000 Encounter for general adult medical examination without abnormal findings: Secondary | ICD-10-CM | POA: Diagnosis not present

## 2015-12-03 DIAGNOSIS — E559 Vitamin D deficiency, unspecified: Secondary | ICD-10-CM | POA: Diagnosis not present

## 2015-12-03 MED ORDER — LEVOTHYROXINE SODIUM 88 MCG PO TABS: 88.0000 ug | ORAL_TABLET | Freq: Every day | ORAL | 3 refills | Status: DC

## 2015-12-03 MED ORDER — HYDROCHLOROTHIAZIDE 12.5 MG PO CAPS: ORAL_CAPSULE | ORAL | 3 refills | Status: DC

## 2015-12-03 MED ORDER — ATENOLOL 25 MG PO TABS: 25.0000 mg | ORAL_TABLET | Freq: Every day | ORAL | 3 refills | Status: DC

## 2015-12-03 NOTE — Progress Notes (Signed)
Pre visit review using our clinic review tool, if applicable. No additional management support is needed unless otherwise documented below in the visit note. 

## 2015-12-03 NOTE — Progress Notes (Signed)
I have personally reviewed the Medicare Annual Wellness questionnaire and have noted 1. The patient's medical and social history 2. Their use of alcohol, tobacco or illicit drugs 3. Their current medications and supplements 4. The patient's functional ability including ADL's, fall risks, home safety risks and hearing or visual             impairment. 5. Diet and physical activities 6. Evidence for depression or mood disorders  The patients weight, height, BMI have been recorded in the chart and visual acuity is per eye clinic.  I have made referrals, counseling and provided education to the patient based review of the above and I have provided the pt with a written personalized care plan for preventive services.  Provider list updated- see scanned forms.  Routine anticipatory guidance given to patient.  See health maintenance.  Flu up to date Shingles up to date PNA up to date Tetanus up to date Colon cancer screening deferred due to age.   Breast cancer screening- f/u pending.  DXA 2016.   Advance directive- husband designated if patient were incapacitated.   Cognitive function addressed- see scanned forms- and if abnormal then additional documentation follows.   Hypertension:    Using medication without problems or lightheadedness: yes  Chest pain with exertion:no Edema:no Short of breath:no  HLD.  Statin intolerant.  Labs d/w pt.    Vit D def.  Osteopenia.  DXA d/w pt.  no need for further intervention at this point. Discussed with patient. She agrees.  Hypothyroidism.  TSH wnl.  No neck mass, no dysphagia.    She is adjusting to the changes after her fall.  She has had f/u with the eye clinic.  Still with L eyelid droop, at baseline.  She may have to have L eye prosthesis size reduction.    PMH and SH reviewed  Meds, vitals, and allergies reviewed.   ROS: Per HPI.  Unless specifically indicated otherwise in HPI, the patient denies:  General: fever. Eyes: acute vision  changes ENT: sore throat Cardiovascular: chest pain Respiratory: SOB GI: vomiting GU: dysuria Musculoskeletal: acute back pain Derm: acute rash Neuro: acute motor dysfunction Psych: worsening mood Endocrine: polydipsia Heme: bleeding Allergy: hayfever  GEN: nad, alert and oriented HEENT: mucous membranes moist, left eye prosthesis noted. Left upper eyelid droop noted, at baseline. NECK: supple w/o LA CV: rrr. PULM: ctab, no inc wob ABD: soft, +bs EXT: no edema SKIN: no acute rash

## 2015-12-03 NOTE — Patient Instructions (Signed)
Take care.  Glad to see you. Update me as needed.  Thanks for your effort.  

## 2015-12-06 NOTE — Assessment & Plan Note (Signed)
Status post left eye prosthesis. Per ophthalmology clinic. Appreciate help of all involved.

## 2015-12-06 NOTE — Assessment & Plan Note (Signed)
Vitamin D level not back to normal, but improved. Osteopenia noted. No need for repeat bone density at this point. Continue as is. She agrees.

## 2015-12-06 NOTE — Assessment & Plan Note (Signed)
Flu up to date Shingles up to date PNA up to date Tetanus up to date Colon cancer screening deferred due to age.   Breast cancer screening- f/u pending.  DXA 2016.   Advance directive- husband designated if patient were incapacitated.   Cognitive function addressed- see scanned forms- and if abnormal then additional documentation follows.

## 2015-12-06 NOTE — Assessment & Plan Note (Signed)
TSH within normal limits. No neck mass. Compliant. Continue as is. She agrees.

## 2015-12-06 NOTE — Assessment & Plan Note (Signed)
Controlled. Continue as is. She agrees.  

## 2015-12-06 NOTE — Assessment & Plan Note (Signed)
Statin intolerant. Continue work on diet and exercise. She agrees. Labs discussed with patient.

## 2015-12-09 DIAGNOSIS — M5136 Other intervertebral disc degeneration, lumbar region: Secondary | ICD-10-CM | POA: Diagnosis not present

## 2015-12-09 DIAGNOSIS — M4726 Other spondylosis with radiculopathy, lumbar region: Secondary | ICD-10-CM | POA: Diagnosis not present

## 2015-12-09 DIAGNOSIS — M48061 Spinal stenosis, lumbar region without neurogenic claudication: Secondary | ICD-10-CM | POA: Diagnosis not present

## 2016-01-24 ENCOUNTER — Telehealth: Payer: Self-pay | Admitting: *Deleted

## 2016-01-24 MED ORDER — METOPROLOL SUCCINATE ER 25 MG PO TB24
25.0000 mg | ORAL_TABLET | Freq: Every day | ORAL | 3 refills | Status: DC
Start: 2016-01-24 — End: 2016-12-29

## 2016-01-24 NOTE — Telephone Encounter (Signed)
Change to metoprolol 25mg  a day.  Sent.   Doesn't have to taper off one med before starting the other.  Update me if lightheaded, or if BP consistently >140/>90.  Thanks.

## 2016-01-24 NOTE — Telephone Encounter (Signed)
Per [facility] is on manufacturer back order. Will need a substitute sent to [facility]. Thanks!

## 2016-01-25 NOTE — Telephone Encounter (Signed)
Patient notified as instructed by telephone and verbalized understanding. Patient stated that she still has about 60 Atenolol on hand and will finish them before starting the new medication.

## 2016-01-26 ENCOUNTER — Telehealth: Payer: Self-pay | Admitting: Family Medicine

## 2016-01-26 DIAGNOSIS — M546 Pain in thoracic spine: Secondary | ICD-10-CM | POA: Diagnosis not present

## 2016-01-26 DIAGNOSIS — M5416 Radiculopathy, lumbar region: Secondary | ICD-10-CM | POA: Diagnosis not present

## 2016-01-26 DIAGNOSIS — M5136 Other intervertebral disc degeneration, lumbar region: Secondary | ICD-10-CM | POA: Diagnosis not present

## 2016-01-26 DIAGNOSIS — M4316 Spondylolisthesis, lumbar region: Secondary | ICD-10-CM | POA: Diagnosis not present

## 2016-01-26 DIAGNOSIS — R1031 Right lower quadrant pain: Secondary | ICD-10-CM | POA: Diagnosis not present

## 2016-01-26 DIAGNOSIS — M4726 Other spondylosis with radiculopathy, lumbar region: Secondary | ICD-10-CM | POA: Diagnosis not present

## 2016-01-26 NOTE — Telephone Encounter (Signed)
She has been getting ESI for over a year. The pain is radiating around her abdomen instead of down the legs. Dr Rita Ohara is doing an MRI of her Back but said he PCP would have to order the MRI of the abdomen.

## 2016-01-26 NOTE — Telephone Encounter (Signed)
Please see what questions she has and let me know.  Thanks.

## 2016-01-26 NOTE — Telephone Encounter (Signed)
Pt saw Dr Durene Cal, he is wanting for pt to have an mri of the abdomen as soon as possible.   Pt request call back and to talk to you for a few minutes cb number is [phone]

## 2016-01-27 ENCOUNTER — Ambulatory Visit (INDEPENDENT_AMBULATORY_CARE_PROVIDER_SITE_OTHER): Payer: PPO | Admitting: Family Medicine

## 2016-01-27 ENCOUNTER — Encounter: Payer: Self-pay | Admitting: Family Medicine

## 2016-01-27 VITALS — BP 142/70 | HR 61 | Temp 97.8°F | Wt 135.8 lb

## 2016-01-27 DIAGNOSIS — G8929 Other chronic pain: Secondary | ICD-10-CM

## 2016-01-27 DIAGNOSIS — R1031 Right lower quadrant pain: Secondary | ICD-10-CM

## 2016-01-27 LAB — BASIC METABOLIC PANEL
BUN: 11 mg/dL (ref 6–23)
CHLORIDE: 103 meq/L (ref 96–112)
CO2: 32 mEq/L (ref 19–32)
Calcium: 9.3 mg/dL (ref 8.4–10.5)
Creatinine, Ser: 0.67 mg/dL (ref 0.40–1.20)
GFR: 89.06 mL/min (ref >60.00)
Glucose, Bld: 99 mg/dL (ref 70–99)
POTASSIUM: 3.2 meq/L — AB (ref 3.5–5.1)
Sodium: 143 mEq/L (ref 135–145)

## 2016-01-27 NOTE — Telephone Encounter (Signed)
Dr. Damita Dunnings spoke to office

## 2016-01-27 NOTE — Assessment & Plan Note (Signed)
Worsened.  Likely with spinal stenosis/back pathology causing radicular pain into the abd, but needs CT abd/pelvis to eval for intraabdominal cause of RLQ pain.  Check bmet today, proceed with CT abd/pel with contrast assuming GFR reasonable.  D/w pt about reason for CT abd/pel vs f/u MRI back per neurosurgery.    I called and talked with Dr. Lollie Sails staff today.  App help of all involved.

## 2016-01-27 NOTE — Patient Instructions (Addendum)
Go to the lab on the way out.  We'll contact you with your lab report. Rosaria Ferries will call about your referral. See her on the way out.  Take tramadol as needed for pain.  Take care.  Glad to see you.

## 2016-01-27 NOTE — Progress Notes (Signed)
Pre visit review using our clinic review tool, if applicable. No additional management support is needed unless otherwise documented below in the visit note. 

## 2016-01-27 NOTE — Telephone Encounter (Signed)
Please try to get Dr. Rita Ohara or his staff on the phone so I can talk with him/them.  Thanks.

## 2016-01-27 NOTE — Progress Notes (Signed)
Has MRI scheduled with Dr. Rita Ohara re: back.    She came in today for advice.  She has RLQ pain- it radiates from the back around to the RLQ but not down the R leg.    She has been getting injections prev per neurosurgery clinic but it didn't last for about 6 weeks.    She has been taking ibuprofen but then had hemorrhoid irritation and bleeding and stopped ibuprofen.  Restarted tramadol in the meantime for pain, it helped some with the pain.    Pain in the back on standing.  The pain is clearly sitting.  No pain sitting.  RLQ pain gets better with sitting.  Injections prev helped with RLQ pain.    No LLQ/L abd pain. No FCNAVD.    Prev CT unremarkable with f/u MRI with fatty deposit in liver but no other acute issues, d/w pt at OV.   Prev MRI spine with mild spinal stenosis noted, d/w pt at OV.   PMH and SH reviewed  ROS: Per HPI unless specifically indicated in ROS section   Meds, vitals, and allergies reviewed.   nad ncat rrr ctab Back not ttp in midline but R lower back ttp in paraspinal muscles, no CVA pain abd soft, not ttp, normal BS, no rebound.  Ext w/o edema No pain when sitting.

## 2016-02-02 DIAGNOSIS — Z135 Encounter for screening for eye and ear disorders: Secondary | ICD-10-CM | POA: Diagnosis not present

## 2016-02-02 DIAGNOSIS — M5126 Other intervertebral disc displacement, lumbar region: Secondary | ICD-10-CM | POA: Diagnosis not present

## 2016-02-02 DIAGNOSIS — M5416 Radiculopathy, lumbar region: Secondary | ICD-10-CM | POA: Diagnosis not present

## 2016-02-02 DIAGNOSIS — M5414 Radiculopathy, thoracic region: Secondary | ICD-10-CM | POA: Diagnosis not present

## 2016-02-02 DIAGNOSIS — M4804 Spinal stenosis, thoracic region: Secondary | ICD-10-CM | POA: Diagnosis not present

## 2016-02-03 ENCOUNTER — Other Ambulatory Visit: Payer: PPO

## 2016-02-03 DIAGNOSIS — Z4422 Encounter for fitting and adjustment of artificial left eye: Secondary | ICD-10-CM | POA: Diagnosis not present

## 2016-02-04 ENCOUNTER — Ambulatory Visit (INDEPENDENT_AMBULATORY_CARE_PROVIDER_SITE_OTHER)
Admission: RE | Admit: 2016-02-04 | Discharge: 2016-02-04 | Disposition: A | Discharge status: Home / Self Care | Payer: PPO | Source: Ambulatory Visit | Attending: Family Medicine | Admitting: Family Medicine

## 2016-02-04 DIAGNOSIS — G8929 Other chronic pain: Secondary | ICD-10-CM

## 2016-02-04 DIAGNOSIS — R1031 Right lower quadrant pain: Secondary | ICD-10-CM | POA: Diagnosis not present

## 2016-02-04 MED ORDER — IOPAMIDOL (ISOVUE-300) INJECTION 61%
100.0000 mL | Freq: Once | INTRAVENOUS | Status: AC | PRN
  Administered 2016-02-04: 100 mL via INTRAVENOUS

## 2016-02-07 DIAGNOSIS — M5416 Radiculopathy, lumbar region: Secondary | ICD-10-CM | POA: Diagnosis not present

## 2016-02-07 DIAGNOSIS — M5030 Other cervical disc degeneration, unspecified cervical region: Secondary | ICD-10-CM | POA: Diagnosis not present

## 2016-02-07 DIAGNOSIS — M542 Cervicalgia: Secondary | ICD-10-CM | POA: Diagnosis not present

## 2016-02-07 DIAGNOSIS — M47812 Spondylosis without myelopathy or radiculopathy, cervical region: Secondary | ICD-10-CM | POA: Diagnosis not present

## 2016-02-14 DIAGNOSIS — H401113 Primary open-angle glaucoma, right eye, severe stage: Secondary | ICD-10-CM | POA: Diagnosis not present

## 2016-02-17 DIAGNOSIS — Z9001 Acquired absence of eye: Secondary | ICD-10-CM | POA: Diagnosis not present

## 2016-02-17 DIAGNOSIS — Z885 Allergy status to narcotic agent status: Secondary | ICD-10-CM | POA: Diagnosis not present

## 2016-02-17 DIAGNOSIS — H1851 Endothelial corneal dystrophy: Secondary | ICD-10-CM | POA: Diagnosis not present

## 2016-02-17 DIAGNOSIS — Z9841 Cataract extraction status, right eye: Secondary | ICD-10-CM | POA: Diagnosis not present

## 2016-02-17 DIAGNOSIS — Z882 Allergy status to sulfonamides status: Secondary | ICD-10-CM | POA: Diagnosis not present

## 2016-02-17 DIAGNOSIS — Z881 Allergy status to other antibiotic agents status: Secondary | ICD-10-CM | POA: Diagnosis not present

## 2016-02-17 DIAGNOSIS — Z9842 Cataract extraction status, left eye: Secondary | ICD-10-CM | POA: Diagnosis not present

## 2016-02-17 DIAGNOSIS — H02831 Dermatochalasis of right upper eyelid: Secondary | ICD-10-CM | POA: Diagnosis not present

## 2016-02-17 DIAGNOSIS — H02402 Unspecified ptosis of left eyelid: Secondary | ICD-10-CM | POA: Diagnosis not present

## 2016-02-17 DIAGNOSIS — Z888 Allergy status to other drugs, medicaments and biological substances status: Secondary | ICD-10-CM | POA: Diagnosis not present

## 2016-02-17 DIAGNOSIS — Q111 Other anophthalmos: Secondary | ICD-10-CM | POA: Diagnosis not present

## 2016-02-17 DIAGNOSIS — Z961 Presence of intraocular lens: Secondary | ICD-10-CM | POA: Diagnosis not present

## 2016-02-21 DIAGNOSIS — I10 Essential (primary) hypertension: Secondary | ICD-10-CM | POA: Diagnosis not present

## 2016-02-21 DIAGNOSIS — M4726 Other spondylosis with radiculopathy, lumbar region: Secondary | ICD-10-CM | POA: Diagnosis not present

## 2016-02-21 DIAGNOSIS — M5136 Other intervertebral disc degeneration, lumbar region: Secondary | ICD-10-CM | POA: Diagnosis not present

## 2016-02-21 DIAGNOSIS — M5416 Radiculopathy, lumbar region: Secondary | ICD-10-CM | POA: Diagnosis not present

## 2016-03-02 DIAGNOSIS — M4726 Other spondylosis with radiculopathy, lumbar region: Secondary | ICD-10-CM | POA: Diagnosis not present

## 2016-03-02 DIAGNOSIS — M5116 Intervertebral disc disorders with radiculopathy, lumbar region: Secondary | ICD-10-CM | POA: Diagnosis not present

## 2016-03-02 DIAGNOSIS — M5416 Radiculopathy, lumbar region: Secondary | ICD-10-CM | POA: Diagnosis not present

## 2016-03-02 DIAGNOSIS — M48061 Spinal stenosis, lumbar region without neurogenic claudication: Secondary | ICD-10-CM | POA: Diagnosis not present

## 2016-04-13 DIAGNOSIS — H401133 Primary open-angle glaucoma, bilateral, severe stage: Secondary | ICD-10-CM | POA: Diagnosis not present

## 2016-05-19 DIAGNOSIS — M5416 Radiculopathy, lumbar region: Secondary | ICD-10-CM | POA: Diagnosis not present

## 2016-05-19 DIAGNOSIS — M4726 Other spondylosis with radiculopathy, lumbar region: Secondary | ICD-10-CM | POA: Diagnosis not present

## 2016-05-19 DIAGNOSIS — M5136 Other intervertebral disc degeneration, lumbar region: Secondary | ICD-10-CM | POA: Diagnosis not present

## 2016-05-19 DIAGNOSIS — M5414 Radiculopathy, thoracic region: Secondary | ICD-10-CM | POA: Diagnosis not present

## 2016-05-25 DIAGNOSIS — M4726 Other spondylosis with radiculopathy, lumbar region: Secondary | ICD-10-CM | POA: Diagnosis not present

## 2016-05-25 DIAGNOSIS — M5136 Other intervertebral disc degeneration, lumbar region: Secondary | ICD-10-CM | POA: Diagnosis not present

## 2016-05-25 DIAGNOSIS — M48061 Spinal stenosis, lumbar region without neurogenic claudication: Secondary | ICD-10-CM | POA: Diagnosis not present

## 2016-06-19 DIAGNOSIS — H401113 Primary open-angle glaucoma, right eye, severe stage: Secondary | ICD-10-CM | POA: Diagnosis not present

## 2016-06-19 DIAGNOSIS — H401123 Primary open-angle glaucoma, left eye, severe stage: Secondary | ICD-10-CM | POA: Diagnosis not present

## 2016-07-12 DIAGNOSIS — H52201 Unspecified astigmatism, right eye: Secondary | ICD-10-CM | POA: Diagnosis not present

## 2016-07-12 DIAGNOSIS — H524 Presbyopia: Secondary | ICD-10-CM | POA: Diagnosis not present

## 2016-07-12 DIAGNOSIS — H5211 Myopia, right eye: Secondary | ICD-10-CM | POA: Diagnosis not present

## 2016-07-27 DIAGNOSIS — M48061 Spinal stenosis, lumbar region without neurogenic claudication: Secondary | ICD-10-CM | POA: Diagnosis not present

## 2016-07-27 DIAGNOSIS — M5116 Intervertebral disc disorders with radiculopathy, lumbar region: Secondary | ICD-10-CM | POA: Diagnosis not present

## 2016-07-27 DIAGNOSIS — M4726 Other spondylosis with radiculopathy, lumbar region: Secondary | ICD-10-CM | POA: Diagnosis not present

## 2016-08-03 ENCOUNTER — Ambulatory Visit (INDEPENDENT_AMBULATORY_CARE_PROVIDER_SITE_OTHER): Payer: PPO | Admitting: Family Medicine

## 2016-08-03 ENCOUNTER — Encounter: Payer: Self-pay | Admitting: Family Medicine

## 2016-08-03 VITALS — BP 124/68 | HR 65 | Temp 97.7°F | Wt 129.5 lb

## 2016-08-03 DIAGNOSIS — R238 Other skin changes: Secondary | ICD-10-CM | POA: Diagnosis not present

## 2016-08-03 DIAGNOSIS — R233 Spontaneous ecchymoses: Secondary | ICD-10-CM

## 2016-08-03 NOTE — Progress Notes (Signed)
Bruising on the arms, on extensor sides, not on medial side, bilaterally.  She feels well. No other bleeding.  Some fatigue. No epistaxis, no gum bleeding.  No blood in stool.   She had her back injected last week.  She has taken some ibuprofen.    Meds, vitals, and allergies reviewed.   ROS: Per HPI unless specifically indicated in ROS section   GEN: nad, alert and oriented HEENT: mucous membranes moist NECK: supple w/o LA CV: rrr PULM: ctab, no inc wob EXT: no edema SKIN: no acute rash but scattered irregular bruising noted on the extensor surface of the arms bilaterally, but not on the medial surface.

## 2016-08-03 NOTE — Patient Instructions (Addendum)
Go to the lab on the way out.  We'll contact you with your lab report. Take care.  Glad to see you.  

## 2016-08-04 DIAGNOSIS — R238 Other skin changes: Secondary | ICD-10-CM | POA: Insufficient documentation

## 2016-08-04 DIAGNOSIS — R233 Spontaneous ecchymoses: Secondary | ICD-10-CM | POA: Insufficient documentation

## 2016-08-04 LAB — CBC WITH DIFFERENTIAL/PLATELET
BASOS ABS: 0.2 10*3/uL — AB (ref 0.0–0.1)
Basophils Relative: 2.2 % (ref 0.0–3.0)
EOS ABS: 0 10*3/uL (ref 0.0–0.7)
Eosinophils Relative: 0.3 % (ref 0.0–5.0)
HCT: 41.4 % (ref 36.0–46.0)
HEMOGLOBIN: 13.9 g/dL (ref 12.0–15.0)
LYMPHS ABS: 2.3 10*3/uL (ref 0.7–4.0)
Lymphocytes Relative: 26 % (ref 12.0–46.0)
MCHC: 33.5 g/dL (ref 30.0–36.0)
MCV: 97.4 fl (ref 78.0–100.0)
MONO ABS: 0.6 10*3/uL (ref 0.1–1.0)
Monocytes Relative: 6.6 % (ref 3.0–12.0)
NEUTROS PCT: 64.9 % (ref 43.0–77.0)
Neutro Abs: 5.8 10*3/uL (ref 1.4–7.7)
Platelets: 361 10*3/uL (ref 150.0–400.0)
RBC: 4.25 Mil/uL (ref 3.87–5.11)
RDW: 14.3 % (ref 11.5–15.5)
WBC: 8.9 10*3/uL (ref 4.0–10.5)

## 2016-08-04 NOTE — Assessment & Plan Note (Signed)
Senile purpura noted, likely due to combination of aging, thin skin. Discussed with patient. Check CBC to make sure she does not have thrombocytopenia. No other alarming bleeding. Update me as needed. See notes on labs. She agrees.

## 2016-08-18 ENCOUNTER — Other Ambulatory Visit: Payer: Self-pay | Admitting: Family Medicine

## 2016-08-18 NOTE — Telephone Encounter (Signed)
Electronic refill request. Lidocaine Last office visit:   08/03/16 Last Filled:   Not on patient's current meds list Please advise.

## 2016-08-19 NOTE — Telephone Encounter (Signed)
Verify with patient.  If needed, then okay to send in as is.  Thanks.

## 2016-08-21 ENCOUNTER — Telehealth: Payer: Self-pay | Admitting: Family Medicine

## 2016-08-21 NOTE — Telephone Encounter (Signed)
Patient states that I will be getting a form from her insurance but that the medication is now on a different Tier and her cost will still be the same, awaiting form.

## 2016-08-21 NOTE — Telephone Encounter (Signed)
Pt would like lugene to call her  Regarding medication lidocaine She stated the insurance company is faxing over a form  To fill out for rx.   She stated this rx went up from$45 to $85  [facility] [address]  Best number [phone]

## 2016-08-22 NOTE — Telephone Encounter (Signed)
Form placed in Dr. Duncan's In Box. 

## 2016-08-22 NOTE — Telephone Encounter (Signed)
Jose with [facility] called requesting status of PA... Spoke to [place] and she has received form needed to complete PA

## 2016-08-23 NOTE — Telephone Encounter (Signed)
Please talk to me about questions 5-7 on the form.  Thanks.

## 2016-08-23 NOTE — Telephone Encounter (Signed)
Questions completed on form per Dr. Damita Dunnings. Completed paperwork faxed back to [facility] as instructed.

## 2016-08-24 ENCOUNTER — Telehealth: Payer: Self-pay | Admitting: Gastroenterology

## 2016-08-24 DIAGNOSIS — M48061 Spinal stenosis, lumbar region without neurogenic claudication: Secondary | ICD-10-CM | POA: Diagnosis not present

## 2016-08-24 DIAGNOSIS — M4726 Other spondylosis with radiculopathy, lumbar region: Secondary | ICD-10-CM | POA: Diagnosis not present

## 2016-08-24 DIAGNOSIS — M5136 Other intervertebral disc degeneration, lumbar region: Secondary | ICD-10-CM | POA: Diagnosis not present

## 2016-08-24 NOTE — Telephone Encounter (Signed)
yes

## 2016-08-24 NOTE — Telephone Encounter (Signed)
OK 

## 2016-08-24 NOTE — Telephone Encounter (Signed)
Dr. Gessner will you accept this patient? °

## 2016-08-24 NOTE — Telephone Encounter (Signed)
LM on vmail to call back to schedule with Dr. Carlean Purl

## 2016-08-25 NOTE — Telephone Encounter (Signed)
Approval received.  Patient notified and faxed to pharmacy.

## 2016-09-07 ENCOUNTER — Telehealth: Payer: Self-pay | Admitting: *Deleted

## 2016-09-07 NOTE — Telephone Encounter (Signed)
Apparently the patient dropped off a urine specimen this morning in the lab on the way to another MD appointment. Terri couldn't find an order so she brought it to me.  I thought I would get the order as I worked on my [employer] but by the end of the day, still no order.  I questioned Dr. Damita Dunnings who could not find anything.  Patient was called and says she thinks she has cystitis and she dropped off the specimen (likely not sterile) and thought that we could call in a Rx if needed.  Patient was advised that an OV would be required if she thought she needed an ABX.  UA was run and culture drawn up but not sure if this is even accurate since the specimen is likely non-sterile and has sat in the office most of the day.  Please advise.

## 2016-09-08 ENCOUNTER — Ambulatory Visit (INDEPENDENT_AMBULATORY_CARE_PROVIDER_SITE_OTHER): Payer: PPO | Admitting: Family Medicine

## 2016-09-08 ENCOUNTER — Encounter: Payer: Self-pay | Admitting: Family Medicine

## 2016-09-08 VITALS — BP 122/72 | HR 66 | Temp 98.6°F | Wt 126.8 lb

## 2016-09-08 DIAGNOSIS — R300 Dysuria: Secondary | ICD-10-CM | POA: Insufficient documentation

## 2016-09-08 LAB — POC URINALSYSI DIPSTICK (AUTOMATED)
BILIRUBIN UA: NEGATIVE
Blood, UA: NEGATIVE
GLUCOSE UA: NEGATIVE
KETONES UA: NEGATIVE
NITRITE UA: NEGATIVE
PH UA: 6 (ref 5.0–8.0)
Protein, UA: NEGATIVE
Spec Grav, UA: 1.02 (ref 1.010–1.025)
Urobilinogen, UA: 0.2 E.U./dL

## 2016-09-08 MED ORDER — CEPHALEXIN 250 MG PO CAPS: 250.0000 mg | ORAL_CAPSULE | Freq: Two times a day (BID) | ORAL | 0 refills | Status: DC

## 2016-09-08 NOTE — Progress Notes (Signed)
Dysuria:yes, burning, urgency.  Local irritation on the skin.   duration of symptoms: about 3 days.  abdominal pain: no fevers:no back pain:some lower back pain but she has some lower back pain at baseline.   Vomiting:no  Meds, vitals, and allergies reviewed.   Per HPI unless specifically indicated in ROS section   GEN: nad, alert and oriented HEENT: mucous membranes moist NECK: supple CV: rrr.  PULM: ctab, no inc wob ABD: soft, +bs, suprapubic area tender EXT: no edema SKIN: no acute rash BACK: no CVA pain

## 2016-09-08 NOTE — Patient Instructions (Signed)
Drink plenty of water and start the antibiotics today.  We'll contact you with your lab report.  Take care.   

## 2016-09-08 NOTE — Assessment & Plan Note (Signed)
Nontoxic, presumed UTI, okay for outpatient f/u.  Keflex, ucx, PO fluids.  She agrees.

## 2016-09-08 NOTE — Telephone Encounter (Signed)
Appointment has already been scheduled

## 2016-09-08 NOTE — Telephone Encounter (Signed)
We can't use the sample she gave since it was in a nonsterile container.   Can she come by the office today for OV?

## 2016-09-09 LAB — URINE CULTURE
MICRO NUMBER:: 80986191
RESULT: NO GROWTH
SPECIMEN QUALITY:: ADEQUATE

## 2016-09-19 ENCOUNTER — Other Ambulatory Visit: Payer: Self-pay | Admitting: Family Medicine

## 2016-09-19 DIAGNOSIS — Z4422 Encounter for fitting and adjustment of artificial left eye: Secondary | ICD-10-CM | POA: Diagnosis not present

## 2016-10-11 DIAGNOSIS — I788 Other diseases of capillaries: Secondary | ICD-10-CM | POA: Diagnosis not present

## 2016-10-11 DIAGNOSIS — D1801 Hemangioma of skin and subcutaneous tissue: Secondary | ICD-10-CM | POA: Diagnosis not present

## 2016-10-11 DIAGNOSIS — L84 Corns and callosities: Secondary | ICD-10-CM | POA: Diagnosis not present

## 2016-10-11 DIAGNOSIS — L821 Other seborrheic keratosis: Secondary | ICD-10-CM | POA: Diagnosis not present

## 2016-10-11 DIAGNOSIS — Z85828 Personal history of other malignant neoplasm of skin: Secondary | ICD-10-CM | POA: Diagnosis not present

## 2016-10-11 DIAGNOSIS — D692 Other nonthrombocytopenic purpura: Secondary | ICD-10-CM | POA: Diagnosis not present

## 2016-10-11 DIAGNOSIS — L820 Inflamed seborrheic keratosis: Secondary | ICD-10-CM | POA: Diagnosis not present

## 2016-10-25 ENCOUNTER — Ambulatory Visit: Payer: PPO

## 2016-10-25 DIAGNOSIS — H401113 Primary open-angle glaucoma, right eye, severe stage: Secondary | ICD-10-CM | POA: Diagnosis not present

## 2016-10-26 ENCOUNTER — Ambulatory Visit (INDEPENDENT_AMBULATORY_CARE_PROVIDER_SITE_OTHER): Payer: PPO

## 2016-10-26 DIAGNOSIS — Z23 Encounter for immunization: Secondary | ICD-10-CM

## 2016-11-10 DIAGNOSIS — H0100A Unspecified blepharitis right eye, upper and lower eyelids: Secondary | ICD-10-CM | POA: Diagnosis not present

## 2016-11-15 DIAGNOSIS — M4726 Other spondylosis with radiculopathy, lumbar region: Secondary | ICD-10-CM | POA: Diagnosis not present

## 2016-11-15 DIAGNOSIS — M5416 Radiculopathy, lumbar region: Secondary | ICD-10-CM | POA: Diagnosis not present

## 2016-11-15 DIAGNOSIS — M5136 Other intervertebral disc degeneration, lumbar region: Secondary | ICD-10-CM | POA: Diagnosis not present

## 2016-11-18 ENCOUNTER — Other Ambulatory Visit: Payer: Self-pay

## 2016-11-18 ENCOUNTER — Encounter (HOSPITAL_COMMUNITY): Payer: Self-pay | Admitting: Emergency Medicine

## 2016-11-18 ENCOUNTER — Emergency Department (HOSPITAL_COMMUNITY)
Admission: EM | Admit: 2016-11-18 | Discharge: 2016-11-18 | Disposition: A | Discharge status: Home / Self Care | Payer: PPO | Attending: Emergency Medicine | Admitting: Emergency Medicine

## 2016-11-18 ENCOUNTER — Emergency Department (HOSPITAL_COMMUNITY): Payer: PPO

## 2016-11-18 DIAGNOSIS — E039 Hypothyroidism, unspecified: Secondary | ICD-10-CM | POA: Insufficient documentation

## 2016-11-18 DIAGNOSIS — I10 Essential (primary) hypertension: Secondary | ICD-10-CM | POA: Insufficient documentation

## 2016-11-18 DIAGNOSIS — M25512 Pain in left shoulder: Secondary | ICD-10-CM | POA: Diagnosis not present

## 2016-11-18 DIAGNOSIS — Z79899 Other long term (current) drug therapy: Secondary | ICD-10-CM | POA: Insufficient documentation

## 2016-11-18 DIAGNOSIS — M25511 Pain in right shoulder: Secondary | ICD-10-CM | POA: Diagnosis not present

## 2016-11-18 MED ORDER — TRAMADOL HCL 50 MG PO TABS: 50.0000 mg | ORAL_TABLET | Freq: Three times a day (TID) | ORAL | 0 refills | Status: DC | PRN

## 2016-11-18 MED ORDER — TRAMADOL HCL 50 MG PO TABS
50.0000 mg | ORAL_TABLET | Freq: Once | ORAL | Status: AC
  Administered 2016-11-18: 50 mg via ORAL
  Filled 2016-11-18: qty 1

## 2016-11-18 MED ORDER — IBUPROFEN 400 MG PO TABS
400.0000 mg | ORAL_TABLET | Freq: Once | ORAL | Status: AC
Start: 2016-11-18 — End: 2016-11-18
  Administered 2016-11-18: 400 mg via ORAL
  Filled 2016-11-18: qty 1

## 2016-11-18 MED ORDER — PREDNISONE 20 MG PO TABS: ORAL_TABLET | ORAL | 0 refills | Status: DC

## 2016-11-18 NOTE — Discharge Instructions (Signed)
It was our pleasure to provide your ER care today - we hope that you feel better.  Take prednisone as prescribed. Take ultram as need for pain - no driving when taking ultram.  Use sling as need for comfort/support.   Follow up with orthopedic doctor in the next 1-2 weeks - call office Monday to arrange appointment.    Your blood pressure is high today - follow up with primary care doctor in 1 week.   Return to ER if worse, new symptoms, fevers, intractable pain, other concern.

## 2016-11-18 NOTE — ED Notes (Signed)
Pt requesting to speak to MD before being discharged. MD made aware. Will cont to monitor.

## 2016-11-18 NOTE — ED Triage Notes (Signed)
Pt states 3 days ago she lifted a heavy box and since has been having serve right shoulder pain pt guards right arm pt has trouble lifting arm arm due to pain.

## 2016-11-18 NOTE — ED Provider Notes (Signed)
[facility] EMERGENCY DEPARTMENT Provider Note   CSN: [id number] Arrival date & time: 11/18/16  2992     History   Chief Complaint Chief Complaint  Patient presents with  . Shoulder Pain    HPI Molly [name] is a 83 y.o. female.  Patient c/o right shoulder pain for past several days. States initially was lifting a heavy box, and felt a pull or pop in her right shoulder. Pain is constant, dull, mod-severe, worse w shoulder movements. Denies hx chronic shoulder problems. No hx rotator cuff injury. Denies neck pain or radicular pain down arm. No numbness/weakness. No skin changes, rash or lesions. No arm swelling. No fever or chills.    The history is provided by the patient.  Shoulder Pain      Past Medical History:  Diagnosis Date  . Diverticulosis of colon   . GERD (gastroesophageal reflux disease)   . Glaucoma   . Hyperlipidemia   . Hypertension   . Kidney stones   . MALIGNANT NEOPLASM OF UTERUS PART UNSPECIFIED 1998  . Thyroid disease    hypothyroidism    Patient Active Problem List   Diagnosis Date Noted  . Dysuria 09/08/2016  . Easy bruising 08/04/2016  . Rectal pain 01/29/2015  . Weakness 01/23/2015  . Dehydration 01/23/2015  . Post concussion syndrome 01/23/2015  . Numbness and tingling of left side of face   . Ruptured globe 01/21/2015  . Hypokalemia 01/21/2015  . Osteopenia 01/01/2015  . Vitamin D deficiency 11/25/2014  . Cough 10/01/2014  . Chronic RLQ pain 03/12/2014  . Advance care planning 11/24/2013  . Cystocele 11/24/2013  . Medicare annual wellness visit, subsequent 1May 17, 202014  . Glaucoma 1May 17, 202014  . Abdominal pain, other specified site 10/25/2011  . Diverticulosis 08/18/2011  . Postmenopausal HRT (hormone replacement therapy) 07/18/2011  . Back pain 07/18/2011  . Shoulder pain 07/18/2011  . GERD 09/14/2008  . TIBIALIS TENDINITIS 07/30/2008  . PAIN IN JOINT, MULTIPLE SITES 06/02/2008  . INTERMITTENT  CLAUDICATION, LEFT LEG 11/19/2007  . Hypothyroidism 05/28/2007  . HYPERLIPIDEMIA, MIXED 05/28/2007  . Essential hypertension 05/28/2007  . Disorder of bone and cartilage 05/28/2007  . IMPAIRED FASTING GLUCOSE 05/28/2007    Past Surgical History:  Procedure Laterality Date  . ABDOMINAL HYSTERECTOMY  12/22/1996   Uterine Cancer Dr Phineas Real  . BREAST BIOPSY  1996   Benign  . CHOLECYSTECTOMY    . Detached Retina Repair OS    . FACIAL LACERATION REPAIR Left 01/20/2015   Performed by Danice Goltz, MD at [facility]  . Release R Long Finger A1 Pulley  01/23/2008   Dr Daylene Katayama  . REPAIR OF RUPTURED GLOBE and FACIAL LACERATIONS Left 01/20/2015   Performed by Danice Goltz, MD at [facility] OR    OB History    Gravida Para Term Preterm AB Living   0 0 0 0 0     SAB TAB Ectopic Multiple Live Births   0 0 0           Home Medications    Prior to Admission medications   Medication Sig Start Date End Date Taking? Authorizing Provider  acetaminophen (TYLENOL) 500 MG tablet Take 1 tablet (500 mg total) by mouth every 6 (six) hours as needed for mild pain or headache. 01/22/15   Dhungel, Flonnie Overman, MD  atenolol (TENORMIN) 25 MG tablet Take 1 tablet by mouth daily 09/19/16   Tonia Ghent, MD  AZOPT 1 % ophthalmic suspension Place 1 drop into the  right eye 2 (two) times daily.  10/27/12   [provider]  Biotin 10 MG CAPS Take by mouth.    [provider]  brimonidine (ALPHAGAN P) 0.1 % SOLN Apply 1 drop to eye 2 (two) times daily.    [provider]  cephALEXin (KEFLEX) 250 MG capsule Take 1 capsule (250 mg total) by mouth 2 (two) times daily. 09/08/16   Tonia Ghent, MD  Cholecalciferol ([facility] VITAMIN D) 2000 UNITS CAPS Take 1 capsule (2,000 Units total) by mouth daily. 11/23/14   Tonia Ghent, MD  Coenzyme Q10 (COQ10 PO) Take 1 tablet by mouth daily.     [provider]  dorzolamide-timolol (COSOPT) 22.3-6.8 MG/ML ophthalmic solution Place 1  drop into the right eye 2 (two) times daily.     [provider]  hydrochlorothiazide (MICROZIDE) 12.5 MG capsule Take 1 capsule by mouth every day 12/03/15   Tonia Ghent, MD  latanoprost (XALATAN) 0.005 % ophthalmic solution Place 1 drop into the right eye at bedtime.     [provider]  levothyroxine (SYNTHROID, LEVOTHROID) 88 MCG tablet Take 1 tablet (88 mcg total) by mouth daily. 12/03/15   Tonia Ghent, MD  lidocaine (XYLOCAINE) 5 % ointment APPLY A SMALL AMOUNT TO RECTUM 3-4 TIMES PER DAY AS NEEDED 08/21/16   Tonia Ghent, MD  metoprolol succinate (TOPROL-XL) 25 MG 24 hr tablet Take 1 tablet (25 mg total) by mouth daily. 01/24/16   Tonia Ghent, MD  Multiple Vitamin (MULTIVITAMIN) tablet Take 1 tablet by mouth daily.      [provider]  Nutritional Supplements (ESTROVEN PO) Take 1 capsule by mouth daily.    [provider]  traMADol (ULTRAM) 50 MG tablet Take by mouth every 6 (six) hours as needed.    [provider]    Family History Family History  Problem Relation Age of Onset  . Heart disease Father 39       MI  . Cancer Sister        Cervical Cancer  . Colon cancer Maternal Aunt   . Diabetes Neg Hx   . Breast cancer Neg Hx     Social History Social History   Tobacco Use  . Smoking status: Never Smoker  . Smokeless tobacco: Never Used  Substance Use Topics  . Alcohol use: No    Alcohol/week: 0.0 oz  . Drug use: No     Allergies   Erythromycin; Ezetimibe-simvastatin; Macrodantin [nitrofurantoin macrocrystal]; Morphine; Nitrofurantoin; Petroleum jelly [skin protectants, misc.]; Preparation h [phenyleph-shark liv oil-mo-pet]; Procaine hcl; Simvastatin; Tape; Adhesive [tape]; and Morphine and related   Review of Systems Review of Systems  Constitutional: Negative for fever.  HENT: Negative for sore throat.   Eyes: Negative for redness.  Respiratory: Negative for shortness of breath.   Cardiovascular:  Negative for chest pain.  Gastrointestinal: Negative for abdominal pain.  Genitourinary: Negative for flank pain.  Musculoskeletal: Negative for neck pain.  Skin: Negative for rash.  Neurological: Negative for headaches.  Hematological: Does not bruise/bleed easily.  Psychiatric/Behavioral: Negative for confusion.     Physical Exam Updated Vital Signs BP (!) 165/119   Pulse 65   Temp 98 F (36.7 C) (Oral)   Resp 16   SpO2 99%   Physical Exam  Constitutional: She appears well-developed and well-nourished. No distress.  HENT:  Head: Atraumatic.  Eyes: Conjunctivae are normal. No scleral icterus.  Neck: Neck supple. No tracheal deviation present.  Cardiovascular: Normal rate  and intact distal pulses.  Pulmonary/Chest: Effort normal. No respiratory distress.  Abdominal: Normal appearance. She exhibits no distension.  Musculoskeletal: She exhibits no edema.  Pain w active rom right shoulder. Good passive rom slowly without severe pain. No swelling, increased warmth or erythema to shoulder. Radial pulse 2+. No arm swelling. c spine non tender, aligned, good rom.   Neurological: She is alert.  RUE motor 5/5. sens grossly intact.   Skin: Skin is warm and dry. No rash noted. She is not diaphoretic.  No shingles/rash to area of pain.   Psychiatric: She has a normal mood and affect.  Nursing note and vitals reviewed.    ED Treatments / Results  Labs (all labs ordered are listed, but only abnormal results are displayed) Labs Reviewed - No data to display  EKG  EKG Interpretation None       Radiology Dg Shoulder Right  Result Date: 11/18/2016 CLINICAL DATA:  Generalized right shoulder pain EXAM: RIGHT SHOULDER - 2+ VIEW COMPARISON:  None. FINDINGS: No acute bony abnormality. Specifically, no fracture, subluxation, or dislocation. Soft tissues are intact. Joint spaces are maintained. IMPRESSION: Negative. Electronically Signed   By: Rolm Baptise M.D.   On: 11/18/2016 11:10      Procedures Procedures (including critical care time)  Medications Ordered in ED Medications  ibuprofen (ADVIL,MOTRIN) tablet 400 mg (not administered)  traMADol (ULTRAM) tablet 50 mg (not administered)     Initial Impression / Assessment and Plan / ED Course  I have reviewed the triage vital signs and the nursing notes.  Pertinent labs & imaging results that were available during my care of the patient were reviewed by me and considered in my medical decision making (see chart for details).  Imaging study ordered.   No meds prior to arrival.  Ultram po, motrin po.    Sling for comfort.   Discussed possible soft tissue process, incl rotator cuff injury, tendonitis, labrum injury, etc.  rec ortho outpatient f/u.   rx for home.   Also rec pcp r/u re bp.    Final Clinical Impressions(s) / ED Diagnoses   Final diagnoses:  None    ED Discharge Orders    None       Lajean Saver, MD 11/18/16 1230

## 2016-11-19 ENCOUNTER — Telehealth: Payer: Self-pay | Admitting: Family Medicine

## 2016-11-19 DIAGNOSIS — M25519 Pain in unspecified shoulder: Secondary | ICD-10-CM

## 2016-11-19 NOTE — Telephone Encounter (Signed)
Let me know if patient needs help getting f/u set with ortho re: shoulder pain.  If her blood pressure stays elevated at home then let me know.  Thanks.

## 2016-11-20 ENCOUNTER — Telehealth: Payer: Self-pay | Admitting: Family Medicine

## 2016-11-20 NOTE — Telephone Encounter (Signed)
Patient would like referral to Dr. Onnie Graham if possible.  Patient has not taken BP since yesterday at ER because she does not have a BP machine at home.

## 2016-11-20 NOTE — Telephone Encounter (Signed)
Ordered.  Thanks.  May be able to check BP at pharmacy.

## 2016-11-20 NOTE — Telephone Encounter (Signed)
Copied from [facility] [id number]. Topic: Referral - Request >> Nov 20, 2016  9:26 AM Aurelio Brash B wrote: Reason for CRM: PT fell hurt shoulder  was in ER Saturday - Er Dr recommends a referral to Dr. Justice Britain  [phone]  at [facility]

## 2016-11-27 ENCOUNTER — Other Ambulatory Visit: Payer: Self-pay | Admitting: Family Medicine

## 2016-11-29 DIAGNOSIS — M7541 Impingement syndrome of right shoulder: Secondary | ICD-10-CM | POA: Diagnosis not present

## 2016-11-29 DIAGNOSIS — M542 Cervicalgia: Secondary | ICD-10-CM | POA: Diagnosis not present

## 2016-11-30 DIAGNOSIS — M5136 Other intervertebral disc degeneration, lumbar region: Secondary | ICD-10-CM | POA: Diagnosis not present

## 2016-11-30 DIAGNOSIS — M48061 Spinal stenosis, lumbar region without neurogenic claudication: Secondary | ICD-10-CM | POA: Diagnosis not present

## 2016-11-30 DIAGNOSIS — M4726 Other spondylosis with radiculopathy, lumbar region: Secondary | ICD-10-CM | POA: Diagnosis not present

## 2016-12-07 DIAGNOSIS — M4722 Other spondylosis with radiculopathy, cervical region: Secondary | ICD-10-CM | POA: Diagnosis not present

## 2016-12-07 DIAGNOSIS — M4802 Spinal stenosis, cervical region: Secondary | ICD-10-CM | POA: Diagnosis not present

## 2016-12-20 DIAGNOSIS — M5030 Other cervical disc degeneration, unspecified cervical region: Secondary | ICD-10-CM | POA: Diagnosis not present

## 2016-12-20 DIAGNOSIS — M4722 Other spondylosis with radiculopathy, cervical region: Secondary | ICD-10-CM | POA: Diagnosis not present

## 2016-12-20 DIAGNOSIS — M5136 Other intervertebral disc degeneration, lumbar region: Secondary | ICD-10-CM | POA: Diagnosis not present

## 2016-12-20 DIAGNOSIS — M542 Cervicalgia: Secondary | ICD-10-CM | POA: Diagnosis not present

## 2016-12-28 ENCOUNTER — Other Ambulatory Visit: Payer: Self-pay | Admitting: Family Medicine

## 2016-12-28 DIAGNOSIS — I10 Essential (primary) hypertension: Secondary | ICD-10-CM

## 2016-12-28 DIAGNOSIS — M8580 Other specified disorders of bone density and structure, unspecified site: Secondary | ICD-10-CM

## 2016-12-28 IMAGING — CT CT MAXILLOFACIAL W/O CM
3 series · 15 of 47 positions shown, 18 images · non-contrast
Comparison: None.

CLINICAL DATA: 85-year-old female with left thigh trauma.

EXAM:
CT MAXILLOFACIAL WITHOUT CONTRAST
TECHNIQUE: Multidetector CT imaging of the maxillofacial structures was
performed. Multiplanar CT image reconstructions were also generated.
A small metallic BB was placed on the right temple in order to
reliably differentiate right from left.

[Series 3: facial st · axial · 0.38mm/px · z∈[-220,-88]mm · 9 of 78 slices shown, 12 images]
[im 6/78  brain]
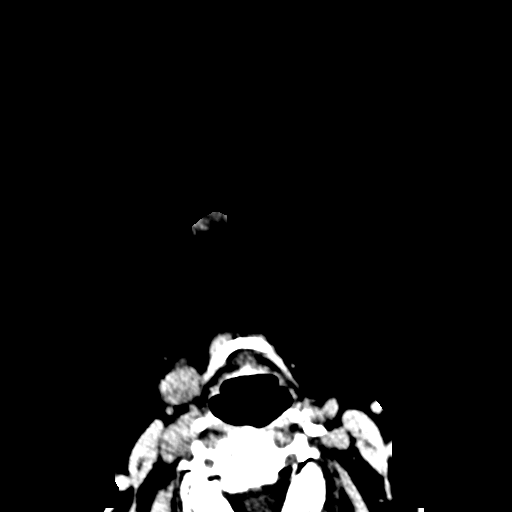
[im 6/78  bone]
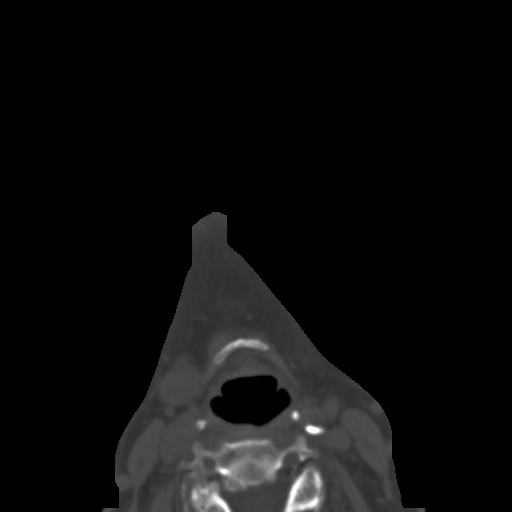
[im 14/78  bone]
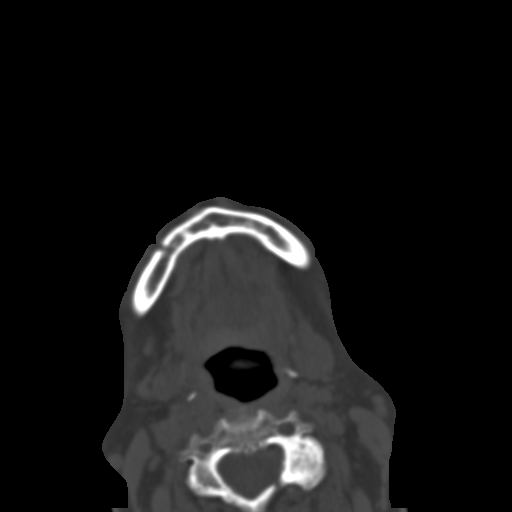
[im 22/78  bone]
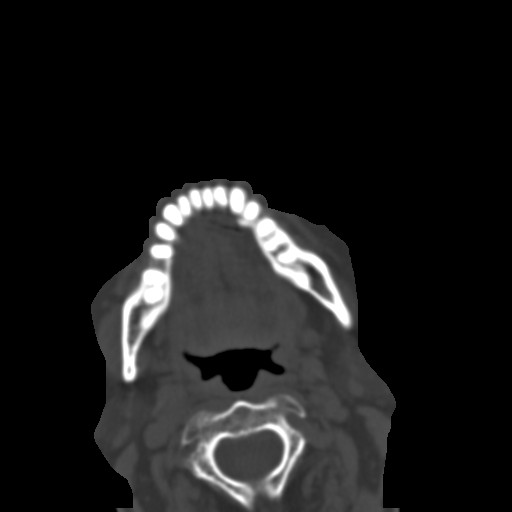
[im 30/78  bone]
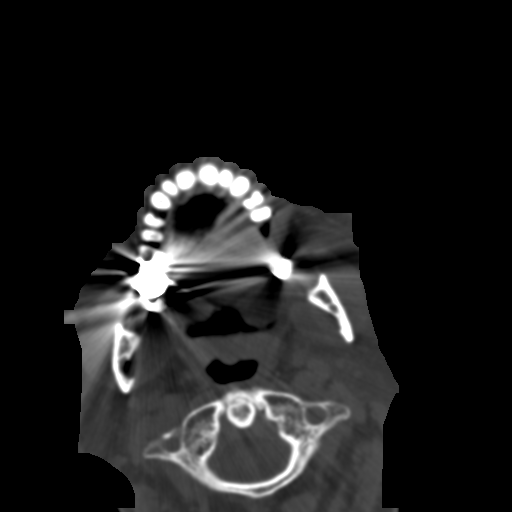
[im 40/78  brain]
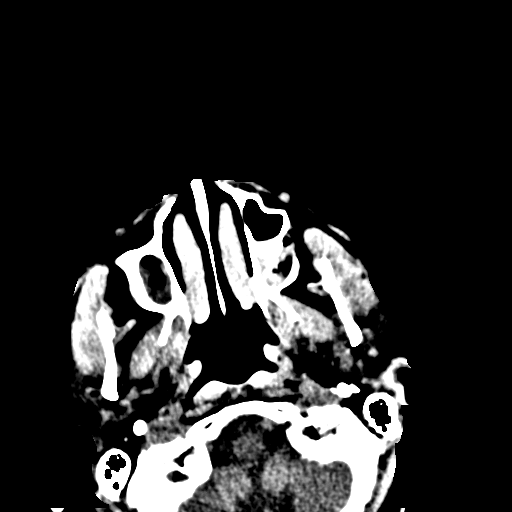
[im 40/78  bone]
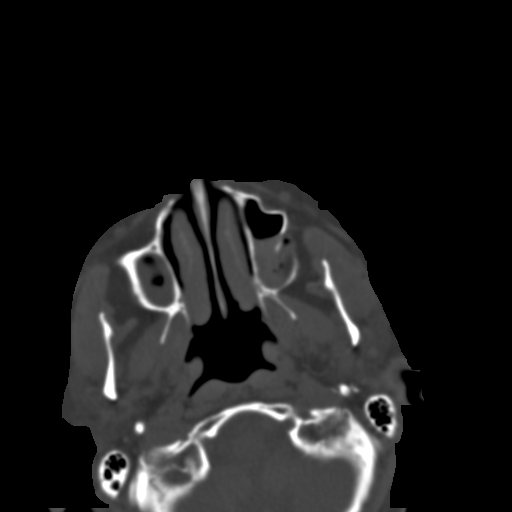
[im 48/78  bone]
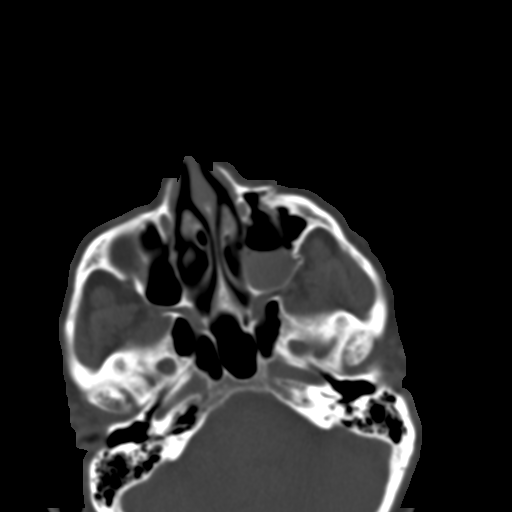
[im 56/78  bone]
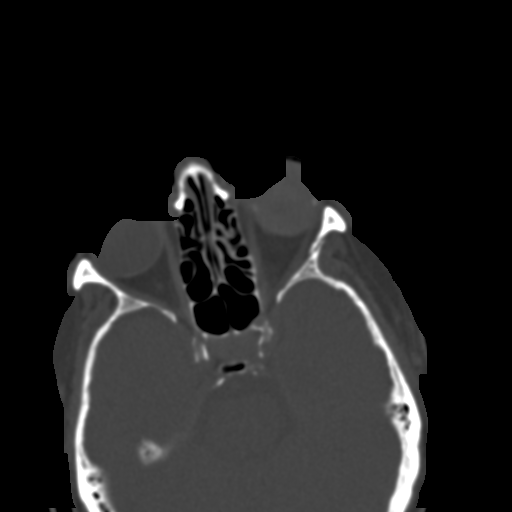
[im 64/78  bone]
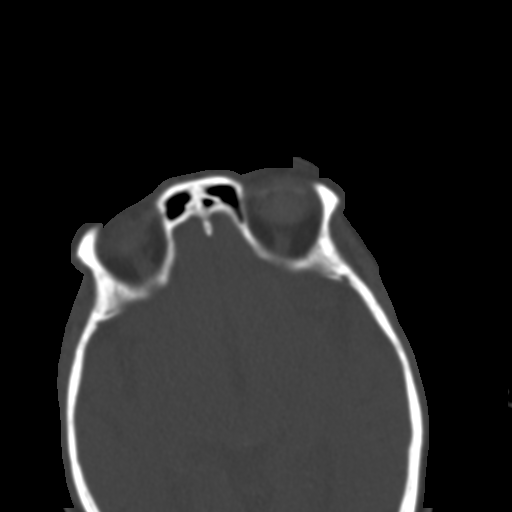
[im 72/78  brain]
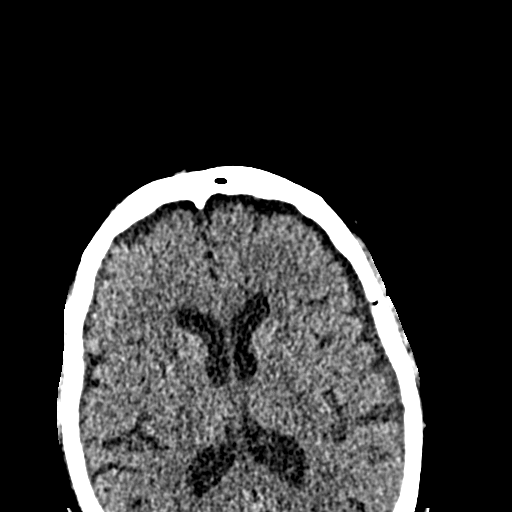
[im 72/78  bone]
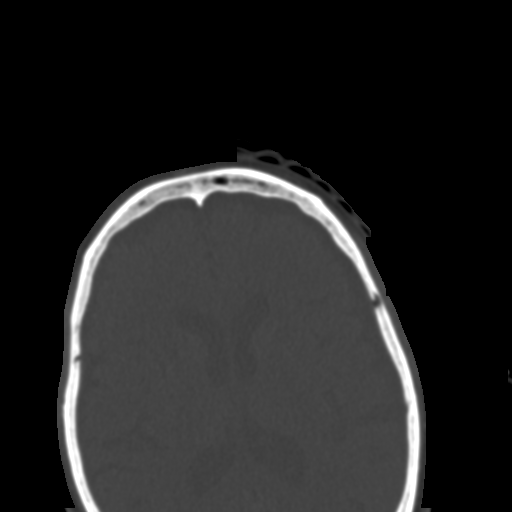

[Series 7: coronal st · coronal · 0.31mm/px · 3 of 81 slices shown]
[im 27/81  bone]
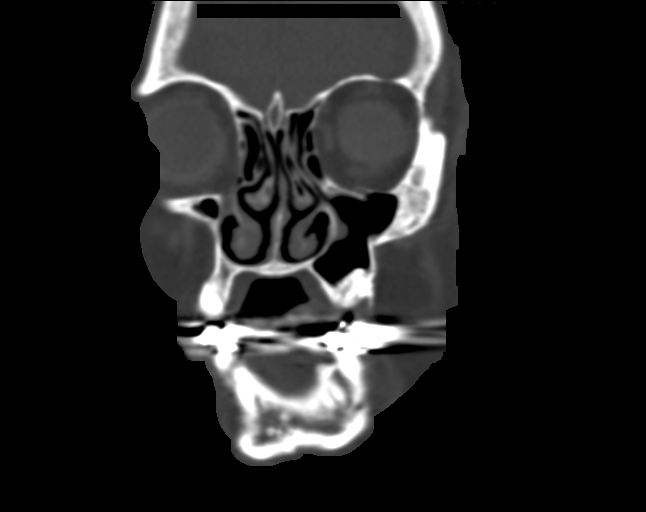
[im 36/81  bone]
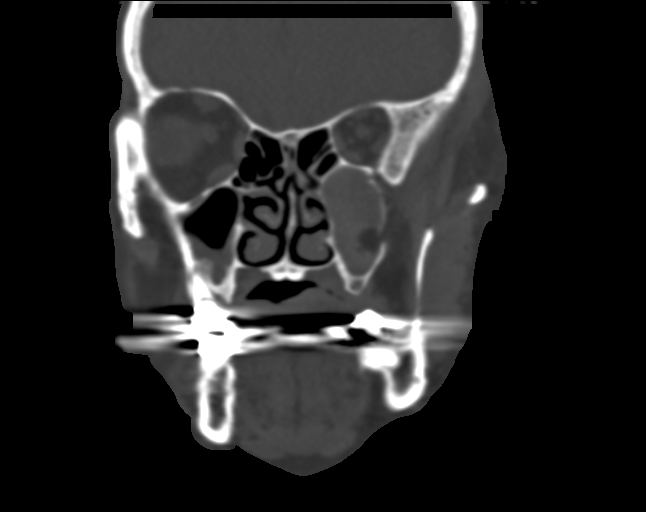
[im 45/81  bone]
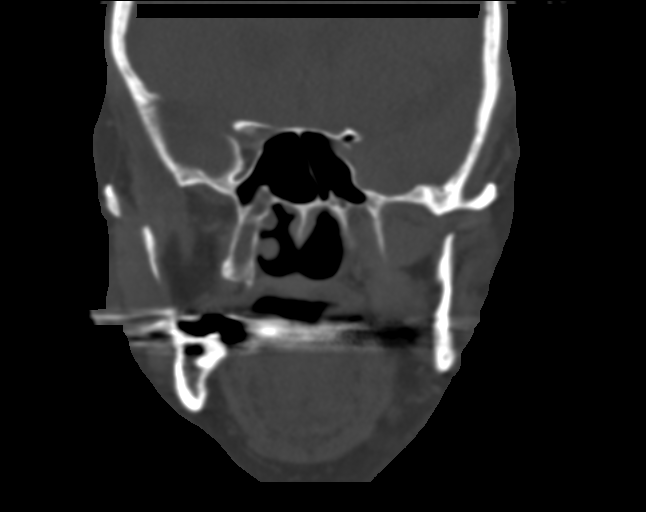

[Series 8: sagittal st · sagittal · 0.33mm/px · 3 of 91 slices shown]
[im 31/91  bone]
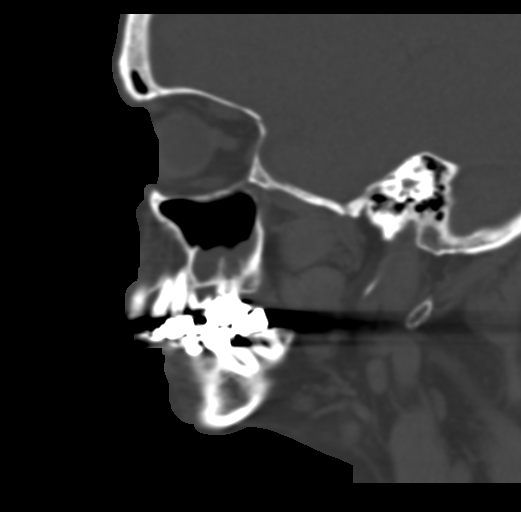
[im 46/91  bone]
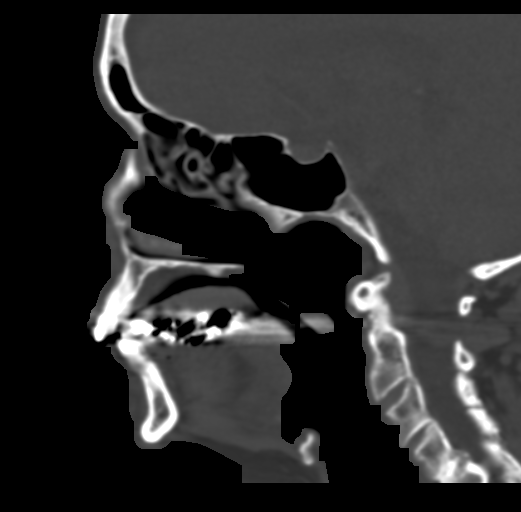
[im 61/91  bone]
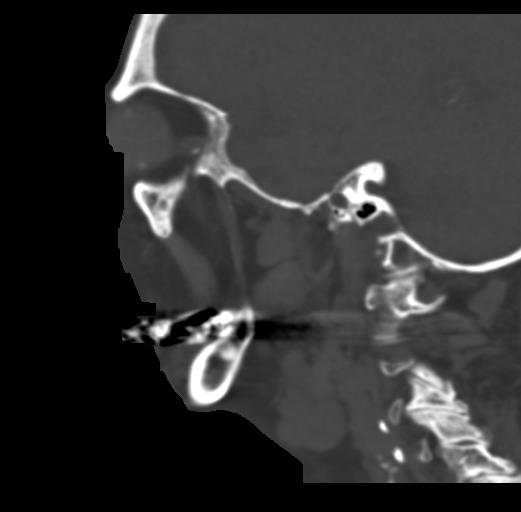

[15 of 47 positions shown; findings below may reference images not displayed]

FINDINGS: There is a minimally depressed fracture of the left zygomatic arch.
There are displaced fractures of the posterolateral wall of the left
maxillary sinus. There is a mildly depressed fracture of the
anterior wall of the left maxillary sinus. There is a mildly
displaced fracture of the left orbital floor along the inferior
orbital groove. There is a minimally displaced fracture of the
lateral wall of the left orbit. A small fracture fragment noted
abutting the lateral rectus muscle. Correlation with clinical exam
is recommended to exclude ocular entrapment. There is fracture of
the inferior orbital rim. No other acute fracture identified. The
mandible and pterygoid plates are intact. There is high attenuating
content within the left globe concerning for intra-ocular
hemorrhage. There is mild enlargement of the left globe. Correlation
with clinical exam recommended. There is scleral banding of the left
globe. The retro-orbital fat are preserved.

There is partial opacification of the maxillary sinuses bilaterally.
High attenuating content within the left maxillary sinus compatible
with hemosinus. The mastoid air cells are clear. No acute hemorrhage
identified within the visualized brain.
IMPRESSION: Left facial and orbital fractures as described.

High attenuating content within the left globe most compatible with
intra-articular hematoma. Clinical correlation and ophthalmology
consult is advised.

These results were called by telephone at the time of interpretation
on 01/20/2015 at [DATE] to Dr. HSHYAR XAWNAKAN , who verbally
acknowledged these results.

## 2016-12-29 ENCOUNTER — Ambulatory Visit (INDEPENDENT_AMBULATORY_CARE_PROVIDER_SITE_OTHER): Payer: PPO

## 2016-12-29 ENCOUNTER — Telehealth: Payer: Self-pay

## 2016-12-29 VITALS — BP 140/74 | HR 50 | Temp 97.9°F | Ht 62.5 in | Wt 124.8 lb

## 2016-12-29 DIAGNOSIS — M8580 Other specified disorders of bone density and structure, unspecified site: Secondary | ICD-10-CM | POA: Diagnosis not present

## 2016-12-29 DIAGNOSIS — Z0000 Encounter for general adult medical examination without abnormal findings: Secondary | ICD-10-CM

## 2016-12-29 DIAGNOSIS — I10 Essential (primary) hypertension: Secondary | ICD-10-CM | POA: Diagnosis not present

## 2016-12-29 LAB — COMPREHENSIVE METABOLIC PANEL
ALT: 13 U/L (ref 0–35)
AST: 15 U/L (ref 0–37)
Albumin: 3.7 g/dL (ref 3.5–5.2)
Alkaline Phosphatase: 63 U/L (ref 39–117)
BUN: 11 mg/dL (ref 6–23)
CHLORIDE: 104 meq/L (ref 96–112)
CO2: 32 mEq/L (ref 19–32)
CREATININE: 0.65 mg/dL (ref 0.40–1.20)
Calcium: 9.1 mg/dL (ref 8.4–10.5)
GFR: 92.03 mL/min (ref >60.00)
GLUCOSE: 118 mg/dL — AB (ref 70–99)
POTASSIUM: 3.6 meq/L (ref 3.5–5.1)
SODIUM: 143 meq/L (ref 135–145)
TOTAL PROTEIN: 6.7 g/dL (ref 6.0–8.3)
Total Bilirubin: 0.4 mg/dL (ref 0.2–1.2)

## 2016-12-29 LAB — LIPID PANEL
Cholesterol: 246 mg/dL — ABNORMAL HIGH (ref 0–200)
HDL: 57.4 mg/dL (ref >39.00)
LDL CALC: 157 mg/dL — AB (ref 0–99)
NONHDL: 188.92
Total CHOL/HDL Ratio: 4
Triglycerides: 158 mg/dL — ABNORMAL HIGH (ref 0.0–149.0)
VLDL: 31.6 mg/dL (ref 0.0–40.0)

## 2016-12-29 LAB — VITAMIN D 25 HYDROXY (VIT D DEFICIENCY, FRACTURES): VITD: 30.39 ng/mL (ref 30.00–100.00)

## 2016-12-29 LAB — TSH: TSH: 1.34 u[IU]/mL (ref 0.35–4.50)

## 2016-12-29 NOTE — Telephone Encounter (Signed)
Please call pt.   I would continue atenolol.  Don't start metoprolol now.  Continue as is.  Thanks.

## 2016-12-29 NOTE — Progress Notes (Signed)
PCP notes:   Health maintenance:  No gaps identified.   Abnormal screenings:   Hearing - failed  Hearing Screening   125Hz  250Hz  500Hz  1000Hz  2000Hz  3000Hz  4000Hz  6000Hz  8000Hz   Right ear:   0 0 40  0    Left ear:   0 0 0  0     Patient concerns:   Medication mgmt - telephone note sent to PCP  Nurse concerns:  None  Next PCP appt:   01/05/17 @ 0845  I reviewed health advisor's note, was available for consultation on the day of service listed in this note, and agree with documentation and plan. Elsie Stain, MD.

## 2016-12-29 NOTE — Progress Notes (Signed)
Pre visit review using our clinic review tool, if applicable. No additional management support is needed unless otherwise documented below in the visit note. 

## 2016-12-29 NOTE — Telephone Encounter (Signed)
Patient advised.

## 2016-12-29 NOTE — Patient Instructions (Signed)
Molly [name] , Thank you for taking time to come for your Medicare Wellness Visit. I appreciate your ongoing commitment to your health goals. Please review the following plan we discussed and let me know if I can assist you in the future.   These are the goals we discussed: Goals    . Follow up with Primary Care Provider     Starting 12/29/2016, I will continue to take medications as prescribed and to keep appointments with PCP as scheduled.        This is a list of the screening recommended for you and due dates:  Health Maintenance  Topic Date Due  . Tetanus Vaccine  01/19/2025  . Flu Shot  Completed  . DEXA scan (bone density measurement)  Completed  . Pneumonia vaccines  Completed   Preventive Care for Adults  A healthy lifestyle and preventive care can promote health and wellness. Preventive health guidelines for adults include the following key practices.  . A routine yearly physical is a good way to check with your health care provider about your health and preventive screening. It is a chance to share any concerns and updates on your health and to receive a thorough exam.  . Visit your dentist for a routine exam and preventive care every 6 months. Brush your teeth twice a day and floss once a day. Good oral hygiene prevents tooth decay and gum disease.  . The frequency of eye exams is based on your age, health, family medical history, use  of contact lenses, and other factors. Follow your health care provider's recommendations for frequency of eye exams.  . Eat a healthy diet. Foods like vegetables, fruits, whole grains, low-fat dairy products, and lean protein foods contain the nutrients you need without too many calories. Decrease your intake of foods high in solid fats, added sugars, and salt. Eat the right amount of calories for you. Get information about a proper diet from your health care provider, if necessary.  . Regular physical exercise is one of the most important  things you can do for your health. Most adults should get at least 150 minutes of moderate-intensity exercise (any activity that increases your heart rate and causes you to sweat) each week. In addition, most adults need muscle-strengthening exercises on 2 or more days a week.  Silver Sneakers may be a benefit available to you. To determine eligibility, you may visit the website: www.silversneakers.com or contact program at 479-440-4695 Mon-Fri between 8AM-8PM.   . Maintain a healthy weight. The body mass index (BMI) is a screening tool to identify possible weight problems. It provides an estimate of body fat based on height and weight. Your health care provider can find your BMI and can help you achieve or maintain a healthy weight.   For adults 20 years and older: ? A BMI below 18.5 is considered underweight. ? A BMI of 18.5 to 24.9 is normal. ? A BMI of 25 to 29.9 is considered overweight. ? A BMI of 30 and above is considered obese.   . Maintain normal blood lipids and cholesterol levels by exercising and minimizing your intake of saturated fat. Eat a balanced diet with plenty of fruit and vegetables. Blood tests for lipids and cholesterol should begin at age 61 and be repeated every 5 years. If your lipid or cholesterol levels are high, you are over 50, or you are at high risk for heart disease, you may need your cholesterol levels checked more frequently. Ongoing high lipid  and cholesterol levels should be treated with medicines if diet and exercise are not working.  . If you smoke, find out from your health care provider how to quit. If you do not use tobacco, please do not start.  . If you choose to drink alcohol, please do not consume more than 2 drinks per day. One drink is considered to be 12 ounces (355 mL) of beer, 5 ounces (148 mL) of wine, or 1.5 ounces (44 mL) of liquor.  . If you are 54-4 years old, ask your health care provider if you should take aspirin to prevent  strokes.  . Use sunscreen. Apply sunscreen liberally and repeatedly throughout the day. You should seek shade when your shadow is shorter than you. Protect yourself by wearing long sleeves, pants, a wide-brimmed hat, and sunglasses year round, whenever you are outdoors.  . Once a month, do a whole body skin exam, using a mirror to look at the skin on your back. Tell your health care provider of new moles, moles that have irregular borders, moles that are larger than a pencil eraser, or moles that have changed in shape or color.

## 2016-12-29 NOTE — Telephone Encounter (Signed)
Dr. Damita Dunnings,  Patient was in today for AWV. During medication review, patient stated she does not know who prescribed Metoprolol 25 mg 24 hr tablet to her. She states she has not taken this medication.  If patient has been ordered to take medication, please give her a call and send [facility] to [facility] on [address]

## 2016-12-29 NOTE — Progress Notes (Signed)
Subjective:   Molly [name] is a 83 y.o. female who presents for Medicare Annual (Subsequent) preventive examination.  Review of Systems:  N/A Cardiac Risk Factors include: advanced age (>77men, >55 women);hypertension;dyslipidemia     Objective:     Vitals: BP 140/74 (BP Location: Right Arm, Patient Position: Sitting, Cuff Size: Normal)   Pulse (!) 50   Temp 97.9 F (36.6 C) (Oral)   Ht 5' 2.5" (1.588 m) Comment: no shoes  Wt 124 lb 12 oz (56.6 kg)   SpO2 98%   BMI 22.45 kg/m   Body mass index is 22.45 kg/m.  Advanced Directives 12/29/2016 01/22/2015 03/11/2014  Does Patient Have a Medical Advance Directive? Yes No No  Type of Advance Directive [place] in Chart? No - copy requested - -  Would patient like information on creating a medical advance directive? - No - patient declined information No - patient declined information    Tobacco Social History   Tobacco Use  Smoking Status Never Smoker  Smokeless Tobacco Never Used     Counseling given: No   Clinical Intake:  Pre-visit preparation completed: Yes  Pain : No/denies pain Pain Score: 0-No pain     Nutritional Status: BMI of 19-24  Normal Nutritional Risks: None Diabetes: No  How often do you need to have someone help you when you read instructions, pamphlets, or other written materials from your doctor or pharmacy?: 1 - Never What is the last grade level you completed in school?: 12th grade + secretarial certificate  Interpreter Needed?: No  Comments: pt lives with spouse Information entered by :: LPinson, LPN  Past Medical History:  Diagnosis Date  . Diverticulosis of colon   . GERD (gastroesophageal reflux disease)   . Glaucoma   . Hyperlipidemia   . Hypertension   . Kidney stones   . MALIGNANT NEOPLASM OF UTERUS PART UNSPECIFIED 1998  . Thyroid disease    hypothyroidism   Past Surgical History:  Procedure Laterality Date   . ABDOMINAL HYSTERECTOMY  12/22/1996   Uterine Cancer Dr Phineas Real  . BREAST BIOPSY  1996   Benign  . CHOLECYSTECTOMY    . Detached Retina Repair OS    . FACIAL LACERATION REPAIR Left 01/20/2015   Procedure: FACIAL LACERATION REPAIR;  Surgeon: Danice Goltz, MD;  Location: [facility];  Service: Ophthalmology;  Laterality: Left;  . Release R Long Finger A1 Pulley  01/23/2008   Dr Daylene Katayama  . RUPTURED GLOBE EXPLORATION AND REPAIR Left 01/20/2015   Procedure: REPAIR OF RUPTURED GLOBE and FACIAL LACERATIONS;  Surgeon: Danice Goltz, MD;  Location: [facility];  Service: Ophthalmology;  Laterality: Left;   Family History  Problem Relation Age of Onset  . Heart disease Father 59       MI  . Cancer Sister        Cervical Cancer  . Colon cancer Maternal Aunt   . Diabetes Neg Hx   . Breast cancer Neg Hx    Social History   Socioeconomic History  . Marital status: Married    Spouse name: Trilby Drummer  . Number of children: 3  . Years of education: None  . Highest education level: None  Social Needs  . Financial resource strain: None  . Food insecurity - worry: None  . Food insecurity - inability: None  . Transportation needs - medical: None  . Transportation needs - non-medical: None  Occupational History  .  Occupation: Cabin crew    Comment: Retired  . Occupation: Radiation protection practitioner    Comment:  Retired   Tobacco Use  . Smoking status: Never Smoker  . Smokeless tobacco: Never Used  Substance and Sexual Activity  . Alcohol use: No    Alcohol/week: 0.0 oz  . Drug use: No  . Sexual activity: Not Currently  Other Topics Concern  . None  Social History Narrative   Married 1952   Retired Network engineer and bookkeeper    Outpatient Encounter Medications as of 12/29/2016  Medication Sig  . acetaminophen (TYLENOL) 500 MG tablet Take 1 tablet (500 mg total) by mouth every 6 (six) hours as needed for mild pain or headache.  Marland Kitchen atenolol (TENORMIN) 25 MG tablet Take 1 tablet by mouth daily (Patient  taking differently: Take 1 tablet (25mg ) by mouth daily)  . Biotin 10 MG CAPS Take by mouth.  . brimonidine (ALPHAGAN P) 0.1 % SOLN Apply 1 drop to eye 2 (two) times daily.  . Cholecalciferol (CVS VITAMIN D) 2000 UNITS CAPS Take 1 capsule (2,000 Units total) by mouth daily.  . Coenzyme Q10 (COQ10 PO) Take 1 tablet by mouth daily.   . dorzolamide-timolol (COSOPT) 22.3-6.8 MG/ML ophthalmic solution Place 1 drop into the right eye 2 (two) times daily.   . hydrochlorothiazide (MICROZIDE) 12.5 MG capsule Take 1 capsule by mouth every day  . latanoprost (XALATAN) 0.005 % ophthalmic solution Place 1 drop into the right eye at bedtime.   Marland Kitchen levothyroxine (SYNTHROID, LEVOTHROID) 88 MCG tablet Take 1 tablet by mouth daily  . lidocaine (XYLOCAINE) 5 % ointment APPLY A SMALL AMOUNT TO RECTUM 3-4 TIMES PER DAY AS NEEDED  . Multiple Vitamin (MULTIVITAMIN) tablet Take 1 tablet by mouth daily.    . Nutritional Supplements (ESTROVEN PO) Take 1 capsule by mouth daily.  . metoprolol succinate (TOPROL-XL) 25 MG 24 hr tablet Take 1 tablet (25 mg total) by mouth daily. (Patient not taking: Reported on 11/18/2016)  . traMADol (ULTRAM) 50 MG tablet Take 1 tablet (50 mg total) every 8 (eight) hours as needed by mouth. (Patient not taking: Reported on 12/29/2016)  . [DISCONTINUED] aspirin EC 81 MG tablet Take 81 mg by mouth.  . [DISCONTINUED] AZOPT 1 % ophthalmic suspension Place 1 drop into the right eye 2 (two) times daily.   . [DISCONTINUED] Misc Natural Products (ESTROVEN + ENERGY MAX STRENGTH PO) Take 1 tablet daily by mouth.  . [DISCONTINUED] predniSONE (DELTASONE) 20 MG tablet 2 po once a day for 3 days, then 1 po once a day for 3 days  . [DISCONTINUED] traMADol (ULTRAM) 50 MG tablet Take by mouth every 6 (six) hours as needed.   No facility-administered encounter medications on file as of 12/29/2016.     Activities of Daily Living In your present state of health, do you have any difficulty performing the  following activities: 12/29/2016  Hearing? Y  Vision? Y  Difficulty concentrating or making decisions? N  Walking or climbing stairs? N  Dressing or bathing? N  Doing errands, shopping? Y  Preparing Food and eating ? N  Using the Toilet? N  In the past six months, have you accidently leaked urine? Y  Do you have problems with loss of bowel control? N  Managing your Medications? N  Managing your Finances? N  Housekeeping or managing your Housekeeping? N  Some recent data might be hidden    Patient Care Team: Tonia Ghent, MD as PCP - General (Family Medicine) Shon Hough, MD as  Consulting Physician (Ophthalmology) Edilia Bo, Tracie Harrier, MD as Referring Physician (Ophthalmology) Jovita Gamma, MD as Consulting Physician (Neurosurgery) Rolm Bookbinder, MD as Consulting Physician (Dermatology) Eula Listen, DDS as Referring Physician (Dentistry)    Assessment:   This is a routine wellness examination for [facility].   Hearing Screening   125Hz  250Hz  500Hz  1000Hz  2000Hz  3000Hz  4000Hz  6000Hz  8000Hz   Right ear:   0 0 40  0    Left ear:   0 0 0  0    Vision Screening Comments: Last vision exam in August 2018 with Dr. Kathrin Penner  Exercise Activities and Dietary recommendations Current Exercise Habits: The patient does not participate in regular exercise at present, Exercise limited by: None identified  Goals    . Follow up with Primary Care Provider     Starting 12/29/2016, I will continue to take medications as prescribed and to keep appointments with PCP as scheduled.        Fall Risk Fall Risk  12/29/2016 11/23/2014  Falls in the past year? No No   Depression Screen PHQ 2/9 Scores 12/29/2016 11/23/2014  PHQ - 2 Score 0 0  PHQ- 9 Score 0 -     Cognitive Function MMSE - Mini Mental State Exam 12/29/2016  Orientation to time 5  Orientation to Place 5  Registration 3  Attention/ Calculation 0  Recall 3  Language- name 2 objects 0  Language- repeat 1    Language- follow 3 step command 3  Language- read & follow direction 0  Write a sentence 0  Copy design 0  Total score 20     PLEASE NOTE: A Mini-Cog screen was completed. Maximum score is 20. A value of 0 denotes this part of [place] MMSE was not completed or the patient failed this part of the Mini-Cog screening.   Mini-Cog Screening Orientation to Time - Max 5 pts Orientation to Place - Max 5 pts Registration - Max 3 pts Recall - Max 3 pts Language Repeat - Max 1 pts Language Follow 3 Step Command - Max 3 pts    Immunization History  Administered Date(s) Administered  . Hepatitis A 06/21/2006  . Influenza Whole 11/20/2006, 09/10/2007  . Influenza,inj,Quad PF,6+ Mos 11/03/2013, 10/19/2015, 10/26/2016  . Influenza-Unspecified 10/26/2014  . Pneumococcal Conjugate-13 11/23/2014  . Pneumococcal Polysaccharide-23 12/03/1998  . Td 12/03/1998, 09/19/2007  . Tdap 01/20/2015  . Zoster 01/30/2007    Screening Tests Health Maintenance  Topic Date Due  . TETANUS/TDAP  01/19/2025  . INFLUENZA VACCINE  Completed  . DEXA SCAN  Completed  . PNA vac Low Risk Adult  Completed         Plan:     I have personally reviewed, addressed, and noted the following in the patient's chart:  A. Medical and social history B. Use of alcohol, tobacco or illicit drugs  C. Current medications and supplements D. Functional ability and status E.  Nutritional status F.  Physical activity G. Advance directives H. List of other physicians I.  Hospitalizations, surgeries, and ER visits in previous 12 months J.  [place] to include hearing, vision, cognitive, depression L. Referrals and appointments - none  In addition, I have reviewed and discussed with patient certain preventive protocols, quality metrics, and best practice recommendations. A written personalized care plan for preventive services as well as general preventive health recommendations were provided to patient.  See  attached scanned questionnaire for additional information.   Signed,   Lindell Noe, MHA, BS, LPN Health Coach

## 2017-01-05 ENCOUNTER — Ambulatory Visit (INDEPENDENT_AMBULATORY_CARE_PROVIDER_SITE_OTHER): Payer: PPO | Admitting: Family Medicine

## 2017-01-05 ENCOUNTER — Encounter: Payer: Self-pay | Admitting: Family Medicine

## 2017-01-05 VITALS — BP 142/70 | HR 63 | Temp 97.9°F | Ht 62.5 in | Wt 124.8 lb

## 2017-01-05 DIAGNOSIS — E038 Other specified hypothyroidism: Secondary | ICD-10-CM | POA: Diagnosis not present

## 2017-01-05 DIAGNOSIS — H409 Unspecified glaucoma: Secondary | ICD-10-CM

## 2017-01-05 DIAGNOSIS — E782 Mixed hyperlipidemia: Secondary | ICD-10-CM

## 2017-01-05 DIAGNOSIS — I10 Essential (primary) hypertension: Secondary | ICD-10-CM | POA: Diagnosis not present

## 2017-01-05 DIAGNOSIS — M545 Low back pain: Secondary | ICD-10-CM

## 2017-01-05 DIAGNOSIS — Z0000 Encounter for general adult medical examination without abnormal findings: Secondary | ICD-10-CM

## 2017-01-05 MED ORDER — ATENOLOL 25 MG PO TABS: ORAL_TABLET | ORAL | Status: DC

## 2017-01-05 NOTE — Patient Instructions (Addendum)
You can call for a mammogram at [facility] [phone]  Don't change your meds for now.  Update me as needed.  Take care.  Glad to see you.

## 2017-01-05 NOTE — Progress Notes (Signed)
Hypertension:    Using medication without problems or lightheadedness: yes Chest pain with exertion:no Edema:not in the AM but some occ BLE edema by nighttime.   Short of breath:no Statin intolerant.  She is on atenolol, not metoprolol. D/w pt.  Complaint.    Hypothyroidism.  Compliant.  TSH wnl.  No neck mass.  No dysphagia.    Hearing screening d/w pt. Declined hearing aids at this point.   Advance directive- husband designated if patient were incapacitated.  Mammogram d/w pt.  See AVS. DXA done 12/2014, reasonable to defer for now.   Her sugar was mildly elevated, d/w pt.    She is trying to put up with monocular vision with tx per eye clinic for glaucoma.  She is trying to maintain as much vision as possible in the R eye.  She has routine f/u.    She has seen Dr. Rita Ohara about her back, with prev injections done.  She has some neck pain now and is trying to put up with that. She had a round of prednisone and her neck/shoulder pain improved.  She is going to f/u with Dr. Rita Ohara.    Meds, vitals, and allergies reviewed.   PMH and SH reviewed  ROS: Per HPI unless specifically indicated in ROS section   GEN: nad, alert and oriented HEENT: mucous membranes moist NECK: supple w/o LA CV: rrr. PULM: ctab, no inc wob ABD: soft, +bs EXT: no edema SKIN: no acute rash

## 2017-01-08 DIAGNOSIS — Z0000 Encounter for general adult medical examination without abnormal findings: Secondary | ICD-10-CM | POA: Insufficient documentation

## 2017-01-08 NOTE — Assessment & Plan Note (Signed)
Reasonable control, no ADE on med.  Continue as is.  Labs d/w pt.  >25 minutes spent in face to face time with patient, >50% spent in counselling or coordination of care (HTN, hypothyroidism, diet, labs, etc)

## 2017-01-08 NOTE — Assessment & Plan Note (Signed)
Hearing screening d/w pt. Declined hearing aids at this point.   Advance directive- husband designated if patient were incapacitated.  Mammogram d/w pt.  See AVS. DXA done 12/2014, reasonable to defer for now.   Her sugar was mildly elevated, d/w pt.

## 2017-01-08 NOTE — Assessment & Plan Note (Signed)
Per eye clinic.  I'll defer.

## 2017-01-08 NOTE — Assessment & Plan Note (Signed)
Improved with prednisone course and she'll f/u with Dr. Rita Ohara.  I'll defer.

## 2017-01-08 NOTE — Assessment & Plan Note (Signed)
Statin intolerant.  Continue as is.  She agrees. 

## 2017-01-08 NOTE — Assessment & Plan Note (Signed)
TSH wnl.  No neck mass.  No dysphagia.  Continue as is.  She agrees.

## 2017-02-22 DIAGNOSIS — M5136 Other intervertebral disc degeneration, lumbar region: Secondary | ICD-10-CM | POA: Diagnosis not present

## 2017-02-22 DIAGNOSIS — M4726 Other spondylosis with radiculopathy, lumbar region: Secondary | ICD-10-CM | POA: Diagnosis not present

## 2017-02-22 DIAGNOSIS — M48061 Spinal stenosis, lumbar region without neurogenic claudication: Secondary | ICD-10-CM | POA: Diagnosis not present

## 2017-03-12 ENCOUNTER — Other Ambulatory Visit: Payer: Self-pay | Admitting: Family Medicine

## 2017-03-12 DIAGNOSIS — Z139 Encounter for screening, unspecified: Secondary | ICD-10-CM

## 2017-03-13 ENCOUNTER — Ambulatory Visit
Admission: RE | Admit: 2017-03-13 | Discharge: 2017-03-13 | Disposition: A | Discharge status: Home / Self Care | Payer: PPO | Source: Ambulatory Visit | Attending: Family Medicine | Admitting: Family Medicine

## 2017-03-13 DIAGNOSIS — Z139 Encounter for screening, unspecified: Secondary | ICD-10-CM

## 2017-03-13 DIAGNOSIS — Z1231 Encounter for screening mammogram for malignant neoplasm of breast: Secondary | ICD-10-CM | POA: Diagnosis not present

## 2017-03-20 DIAGNOSIS — I10 Essential (primary) hypertension: Secondary | ICD-10-CM | POA: Diagnosis not present

## 2017-03-20 DIAGNOSIS — M5416 Radiculopathy, lumbar region: Secondary | ICD-10-CM | POA: Diagnosis not present

## 2017-03-20 DIAGNOSIS — M5136 Other intervertebral disc degeneration, lumbar region: Secondary | ICD-10-CM | POA: Diagnosis not present

## 2017-03-20 DIAGNOSIS — M4726 Other spondylosis with radiculopathy, lumbar region: Secondary | ICD-10-CM | POA: Diagnosis not present

## 2017-04-11 ENCOUNTER — Encounter: Payer: Self-pay | Admitting: Family Medicine

## 2017-04-11 ENCOUNTER — Ambulatory Visit (INDEPENDENT_AMBULATORY_CARE_PROVIDER_SITE_OTHER): Payer: PPO | Admitting: Family Medicine

## 2017-04-11 DIAGNOSIS — J069 Acute upper respiratory infection, unspecified: Secondary | ICD-10-CM | POA: Diagnosis not present

## 2017-04-11 NOTE — Progress Notes (Signed)
Checked BP with readings 150-180s/70-90s at home.  BP clearly better here at [place].  Still on baseline BB and HCTZ.    She had recent URI sx with cough and post nasal gtt.  Started with nasal/sinus pressure with some ST.  That is better but still with some cough.  Started about 5 days ago.  No fevers.  No sputum.  She isn't using decongestants.  She has taking some ibuprofen.    Meds, vitals, and allergies reviewed.   ROS: Per HPI unless specifically indicated in ROS section   GEN: nad, alert and oriented HEENT: mucous membranes moist, tm w/o erythema, nasal exam w/o erythema, clear discharge noted,  OP with cobblestoning NECK: supple w/o LA CV: rrr.   PULM: ctab, no inc wob EXT: no edema SKIN: no acute rash

## 2017-04-11 NOTE — Patient Instructions (Signed)
You look good.  Your BP is fine today.  I think you had a cold and that likely affected your pressure.  Don't change your meds for now.  Take your cuff to your next appointment to calibrate it.  Take care.  Glad to see you.

## 2017-04-12 DIAGNOSIS — J069 Acute upper respiratory infection, unspecified: Secondary | ICD-10-CM | POA: Insufficient documentation

## 2017-04-12 NOTE — Assessment & Plan Note (Signed)
BP is fine today.  I think she has a resolving cold and that likely affected her pressure.  No change in meds for now.  She can take her cuff to her next appointment to calibrate it.  Supportive care o/w.  She agrees.

## 2017-04-25 DIAGNOSIS — H401113 Primary open-angle glaucoma, right eye, severe stage: Secondary | ICD-10-CM | POA: Diagnosis not present

## 2017-04-25 DIAGNOSIS — H401123 Primary open-angle glaucoma, left eye, severe stage: Secondary | ICD-10-CM | POA: Diagnosis not present

## 2017-06-07 DIAGNOSIS — M5136 Other intervertebral disc degeneration, lumbar region: Secondary | ICD-10-CM | POA: Diagnosis not present

## 2017-06-07 DIAGNOSIS — M4726 Other spondylosis with radiculopathy, lumbar region: Secondary | ICD-10-CM | POA: Diagnosis not present

## 2017-06-07 DIAGNOSIS — M48061 Spinal stenosis, lumbar region without neurogenic claudication: Secondary | ICD-10-CM | POA: Diagnosis not present

## 2017-07-03 ENCOUNTER — Encounter: Payer: Self-pay | Admitting: Family Medicine

## 2017-07-03 NOTE — Telephone Encounter (Signed)
Pt reports symptoms  X 2 weeks - Pt has been taking her BP and pulse and they are as follows  today 134/78  Pulse 61  yesterday 147/83  Pulse 75   2 days ago 146/93 pulse 63    3 days ago 139/85 pulse 64    She denies  any swelling  Or bleeding    Appt made for tomorrow with Dr Ronal Fear spoke with Dr Damita Dunnings care advice and patient was advised to avoid going up stairs and if symptoms worse call 911/ ER

## 2017-07-03 NOTE — Telephone Encounter (Signed)
This encounter was created in error - please disregard.

## 2017-07-03 NOTE — Telephone Encounter (Signed)
Pt reports the symptoms started  Reason for Disposition . [1] MODERATE weakness (i.e., interferes with work, school, normal activities) AND [2] persists > 3 days  Answer Assessment - Initial Assessment Questions 1. DESCRIPTION: "Describe how you are feeling."       Weak- decreased appetite  occasonal loose stool  Lack of energy   2. SEVERITY: "How bad is it?"  "Can you stand and walk?"   - MILD - Feels weak or tired, but does not interfere with work, school or normal activities   - [place] to stand and walk; weakness interferes with work, school, or normal activities   - SEVERE - Unable to stand or walk       Moderate   3. ONSET:  "When did the weakness begin?"        2 weeks ago gradually getting worse   4. CAUSE: "What do you think is causing the weakness?"         No idea  5. MEDICINES: "Have you recently started a new medicine or had a change in the amount of a medicine?"          No 6. OTHER SYMPTOMS: "Do you have any other symptoms?" (e.g., chest pain, fever, cough, SOB, vomiting, diarrhea, bleeding, other areas of pain)         Get shortness of breath no   7. PREGNANCY: "Is there any chance you are pregnant?" "When was your last menstrual period?"     n/a  Protocols used: WEAKNESS (GENERALIZED) AND FATIGUE-A-AH

## 2017-07-03 NOTE — Telephone Encounter (Signed)
DO NOT DISREGARD - THIS WAS NOT CREATED IN ERROR

## 2017-07-03 NOTE — Telephone Encounter (Signed)
I spoke with Nicole Kindred and pt; pt said for about 2 wks has had generalized weakness; no more weakness on one side or the other.pt has SOB upon exertion especially if goes up stairs; the first flight of stairs is OK but pt has to rest at landing and pulls herself up the second flight of stairs with the railing.. Pt has no energy.  Pt does not have H/A, dizziness ,CP or swelling and no bleeding.  Dr Damita Dunnings said will see pt on 07/04/17 at 2 pm and if condition worsens pt is to go to ED; pt will continue to monitor BP , pt is holding HCTZ until seen on 07/04/17; pt has not taken HCTZ today. Pt's bedroom is down stairs on mail level and will avoid stairs until seen. FYI to Dr Damita Dunnings.

## 2017-07-04 ENCOUNTER — Ambulatory Visit (INDEPENDENT_AMBULATORY_CARE_PROVIDER_SITE_OTHER): Payer: PPO | Admitting: Family Medicine

## 2017-07-04 ENCOUNTER — Encounter: Payer: Self-pay | Admitting: Family Medicine

## 2017-07-04 VITALS — BP 138/78 | HR 60 | Temp 98.6°F | Ht 62.5 in | Wt 123.8 lb

## 2017-07-04 DIAGNOSIS — R5383 Other fatigue: Secondary | ICD-10-CM | POA: Diagnosis not present

## 2017-07-04 LAB — TSH: TSH: 0.81 u[IU]/mL (ref 0.35–4.50)

## 2017-07-04 LAB — CBC WITH DIFFERENTIAL/PLATELET
BASOS PCT: 0.9 % (ref 0.0–3.0)
Basophils Absolute: 0.1 10*3/uL (ref 0.0–0.1)
EOS PCT: 0.4 % (ref 0.0–5.0)
Eosinophils Absolute: 0 10*3/uL (ref 0.0–0.7)
HEMATOCRIT: 40.4 % (ref 36.0–46.0)
HEMOGLOBIN: 13.5 g/dL (ref 12.0–15.0)
LYMPHS PCT: 26 % (ref 12.0–46.0)
Lymphs Abs: 2.2 10*3/uL (ref 0.7–4.0)
MCHC: 33.4 g/dL (ref 30.0–36.0)
MCV: 96.3 fl (ref 78.0–100.0)
MONO ABS: 0.7 10*3/uL (ref 0.1–1.0)
Monocytes Relative: 8.9 % (ref 3.0–12.0)
NEUTROS ABS: 5.3 10*3/uL (ref 1.4–7.7)
Neutrophils Relative %: 63.8 % (ref 43.0–77.0)
PLATELETS: 250 10*3/uL (ref 150.0–400.0)
RBC: 4.19 Mil/uL (ref 3.87–5.11)
RDW: 13.8 % (ref 11.5–15.5)
WBC: 8.3 10*3/uL (ref 4.0–10.5)

## 2017-07-04 LAB — COMPREHENSIVE METABOLIC PANEL
ALBUMIN: 3.5 g/dL (ref 3.5–5.2)
ALT: 13 U/L (ref 0–35)
AST: 16 U/L (ref 0–37)
Alkaline Phosphatase: 76 U/L (ref 39–117)
BUN: 11 mg/dL (ref 6–23)
CALCIUM: 9.2 mg/dL (ref 8.4–10.5)
CHLORIDE: 101 meq/L (ref 96–112)
CO2: 30 meq/L (ref 19–32)
CREATININE: 0.58 mg/dL (ref 0.40–1.20)
GFR: 104.83 mL/min (ref >60.00)
Glucose, Bld: 239 mg/dL — ABNORMAL HIGH (ref 70–99)
POTASSIUM: 3.9 meq/L (ref 3.5–5.1)
SODIUM: 137 meq/L (ref 135–145)
Total Bilirubin: 0.2 mg/dL (ref 0.2–1.2)
Total Protein: 6.7 g/dL (ref 6.0–8.3)

## 2017-07-04 LAB — VITAMIN B12: VITAMIN B 12: 557 pg/mL (ref 211–911)

## 2017-07-04 NOTE — Patient Instructions (Signed)
Stay off HCTZ for now.  Go to the lab on the way out.  We'll contact you with your lab report. Update me in a few days.  Take care.  Glad to see you.

## 2017-07-04 NOTE — Progress Notes (Signed)
She held HCTZ as of yesterday.    Sx in the last few days.  Gradually worse in the last ~2 weeks.  No clear trigger.  "I'm not enthused about getting up and doing anything and that's not me."  Fatigued.  She has 18 internal steps in the house.  She has to pause on the landing halfway up now.    Her bedroom is on the first floor.  She is minimally, "not really" lightheaded.  Some occ mild HA, not severe, "I wouldn't even take an aspirin for it."  No CP, no SOB.  No fevers.    She has some irritation w/o bleeding from hemorrhoids.  No other bleeding.   Meds, vitals, and allergies reviewed.   ROS: Per HPI unless specifically indicated in ROS section   GEN: nad, alert and oriented HEENT: mucous membranes moist NECK: supple w/o LA CV: rrr. PULM: ctab, no inc wob ABD: soft, +bs EXT: no edema SKIN: no acute rash

## 2017-07-06 ENCOUNTER — Other Ambulatory Visit: Payer: Self-pay | Admitting: Family Medicine

## 2017-07-06 ENCOUNTER — Encounter: Payer: Self-pay | Admitting: Family Medicine

## 2017-07-06 DIAGNOSIS — E119 Type 2 diabetes mellitus without complications: Secondary | ICD-10-CM

## 2017-07-06 HISTORY — DX: Type 2 diabetes mellitus without complications: E11.9

## 2017-07-06 NOTE — Assessment & Plan Note (Signed)
Unclear source, no focal changes on exam today.  Reasonable to hold hydrochlorothiazide in the meantime just in case relative hypotension could contribute.  Continue to drink plenty of fluids.  See notes on labs.  Addendum glucose greater than 200 so she meets criteria for diabetes.  Will get A1c.  See notes on labs. >25 minutes spent in face to face time with patient, >50% spent in counselling or coordination of care.

## 2017-07-09 ENCOUNTER — Ambulatory Visit (INDEPENDENT_AMBULATORY_CARE_PROVIDER_SITE_OTHER): Payer: PPO | Admitting: Family Medicine

## 2017-07-09 ENCOUNTER — Encounter: Payer: Self-pay | Admitting: Family Medicine

## 2017-07-09 VITALS — BP 112/70 | HR 67 | Temp 98.2°F | Ht 62.5 in | Wt 125.5 lb

## 2017-07-09 DIAGNOSIS — E119 Type 2 diabetes mellitus without complications: Secondary | ICD-10-CM

## 2017-07-09 LAB — POCT GLYCOSYLATED HEMOGLOBIN (HGB A1C): HEMOGLOBIN A1C: 8.9 % — AB (ref 4.0–5.6)

## 2017-07-09 MED ORDER — METFORMIN HCL 500 MG PO TABS: ORAL_TABLET | ORAL | 3 refills | Status: DC

## 2017-07-09 NOTE — Progress Notes (Signed)
She has been feeling weak overall without focal abnormality.  This is been progressive over the last few weeks.  Evaluated at last office visit, advised to stop hydrochlorothiazide, routine labs were drawn.  Significant blood sugar elevation noted.  Return for follow-up today.  Meets criteria for diabetes diagnosis.  History of pancreatitis in the distant past.  Diet is generally good.  Rare carbs overall except for some sweet tea and she can taper that.  D/w pt about diet/diabetes pathophysiology, etc.  Meds, vitals, and allergies reviewed.   ROS: Per HPI unless specifically indicated in ROS section   GEN: nad, alert and oriented HEENT: mucous membranes moist NECK: supple w/o LA CV: rrr. PULM: ctab, no inc wob ABD: soft, +bs EXT: no edema SKIN: no acute rash

## 2017-07-09 NOTE — Patient Instructions (Signed)
Stay off HCTZ for now.  Start taking metformin once a day.  If tolerated, then increase to 1 pill twice a day after about 10 days.  Update me in about 1 weeks.  Check with your insurance to see if they will cover a diabetic meter.  Update me and we'll send it in.  Plan on recheck in about 3 months but I want to hear how you are doing in the meantime.  Take care.  Glad to see you.

## 2017-07-10 NOTE — Assessment & Plan Note (Addendum)
New diagnosis.  Pathophysiology of diabetes discussed with patient.  Previous lab results and A1c from today discussed with patient. Stay off HCTZ for now.  Start taking metformin once a day.  If tolerated, then increase to 1 pill twice a day after about 10 days.  Update me in about 1 week.   She'll check with insurance to see if they will cover a diabetic meter.  She'll update me and we'll send it in.  Plan on recheck in about 3 months but I want to hear how she is doing in the meantime.  Discussed with patient about low carbohydrate diet.  It is likely that a combination of age and previous pancreatitis made the situation more likely to happen. >25 minutes spent in face to face time with patient, >50% spent in counselling or coordination of care.  We can work on her routine health maintenance issues related to diabetes as we go along.

## 2017-07-11 LAB — HM DIABETES EYE EXAM

## 2017-07-12 ENCOUNTER — Telehealth: Payer: Self-pay | Admitting: Family Medicine

## 2017-07-12 ENCOUNTER — Other Ambulatory Visit: Payer: Self-pay | Admitting: *Deleted

## 2017-07-12 MED ORDER — FREESTYLE SYSTEM KIT: PACK | 0 refills | Status: DC

## 2017-07-12 NOTE — Telephone Encounter (Signed)
Rx sent. Patient advised. 

## 2017-07-12 NOTE — Telephone Encounter (Signed)
[id number] [place], [place] - [address]  [address] [place] [address]  Phone: [phone] Fax: [phone]   

## 2017-07-12 NOTE — Telephone Encounter (Signed)
Copied from [place] [phone]. Topic: Quick Communication - See Telephone Encounter >> Jul 12, 2017  3:37 PM Rutherford Nail, NT wrote: CRM for notification. See Telephone encounter for: 07/12/17. Patient calling and states that the pharmacy is needing a prescription for the patient to have a glucometer. Please advise. Patient is requesting a call back when this has been done. [id number] [address], [place]

## 2017-07-12 NOTE — Telephone Encounter (Signed)
Spoke to pt and advised to [employer] and verify which meters are covered. Once obtained, she may contact office back to have Rx sent for new meter

## 2017-07-12 NOTE — Telephone Encounter (Signed)
Patient is calling back and states insurance covers free style, precision and pouch.

## 2017-07-15 ENCOUNTER — Encounter: Payer: Self-pay | Admitting: Family Medicine

## 2017-07-15 DIAGNOSIS — Z970 Presence of artificial eye: Secondary | ICD-10-CM | POA: Insufficient documentation

## 2017-07-16 ENCOUNTER — Other Ambulatory Visit: Payer: Self-pay | Admitting: *Deleted

## 2017-07-16 MED ORDER — ONETOUCH DELICA LANCETS 33G MISC: 1 refills | Status: DC

## 2017-07-16 MED ORDER — ONETOUCH VERIO FLEX SYSTEM W/DEVICE KIT: PACK | 0 refills | Status: DC

## 2017-07-16 MED ORDER — GLUCOSE BLOOD VI STRP: ORAL_STRIP | 1 refills | Status: DC

## 2017-07-16 NOTE — Telephone Encounter (Signed)
Glucose monitoring kit and supplies [facility] [address].

## 2017-07-16 NOTE — Telephone Encounter (Signed)
Patient;s daughter Molly [name]) called and wants her mom to have the "one touch verio flex meter" because that is what she uses and she can better help her with this particular meter along with the supplies. [facility] [address].

## 2017-07-17 ENCOUNTER — Telehealth: Payer: Self-pay | Admitting: Family Medicine

## 2017-07-17 ENCOUNTER — Encounter: Payer: Self-pay | Admitting: Family Medicine

## 2017-07-17 NOTE — Telephone Encounter (Signed)
Opened in error

## 2017-07-17 NOTE — Telephone Encounter (Signed)
Noted. Thanks.  Will await input from patient.  

## 2017-07-17 NOTE — Telephone Encounter (Signed)
Copied from [place] [phone]. Topic: Appointment Scheduling - Scheduling Inquiry for Clinic >> Jul 17, 2017  9:24 AM Percell Belt A wrote: Reason for CRM: pt called in and stated that Dr Damita Dunnings told her that he wanted to see her this coming Friday to recheck sugar.  There were not appt available.  Does Dr Damita Dunnings want to work her in Friday?      Best number -[phone] Cell [phone]

## 2017-07-17 NOTE — Telephone Encounter (Signed)
Spoke to Dr Damita Dunnings who states f/u was not required if pt was feeling well. Spoke to pt who confirmed she is "fine" and does not feel as though a f/u is needed at this time. Pt states she will p/u her meter and contact Dr Damita Dunnings back next week with her glucose readings.

## 2017-07-23 NOTE — Telephone Encounter (Signed)
Patient is reporting blood sugar. 07/17-146 07/18-143 07/19-136 07/20-147 07/21-147 07/22-114

## 2017-07-23 NOTE — Telephone Encounter (Signed)
That is way better.  Would continue as is.  If she has any low sugars, below 80 or 90 or where she gets symptomatic or shaky, then cut back on the metformin.  If her sugars start to go back up, consistently above 160, then let me know.  If her sugars are staying 100-150, then continue as is and recheck A1c/labs at visit in early 10/2017.  Thanks.

## 2017-07-24 NOTE — Telephone Encounter (Signed)
Patient advised.

## 2017-07-24 NOTE — Telephone Encounter (Signed)
She could have indigestion/GERD from the metformin.  I would hold it for a few days and see if the sx resolve, then retry the medicine at that point.  If she is getting worse with emergent CP, then advise ER but this may be med related.  Thanks.

## 2017-07-24 NOTE — Telephone Encounter (Signed)
Patient advised.  Patient says right now she is just taking the 1 pill (500 mg) but will monitor her BS and if it changes, will take the 2nd pill.  Appt scheduled in early 10/2017.  Patient states that since starting the Metformin, she has experienced tightness in her chest that makes her feel like she can't breathe.  Could this be a side effect of the Metformin?

## 2017-08-03 ENCOUNTER — Other Ambulatory Visit: Payer: Self-pay | Admitting: Family Medicine

## 2017-08-13 ENCOUNTER — Ambulatory Visit (INDEPENDENT_AMBULATORY_CARE_PROVIDER_SITE_OTHER): Payer: PPO | Admitting: Family Medicine

## 2017-08-13 ENCOUNTER — Encounter: Payer: Self-pay | Admitting: Family Medicine

## 2017-08-13 VITALS — BP 130/68 | HR 63 | Temp 98.1°F | Ht 62.5 in | Wt 122.2 lb

## 2017-08-13 DIAGNOSIS — E119 Type 2 diabetes mellitus without complications: Secondary | ICD-10-CM | POA: Diagnosis not present

## 2017-08-13 DIAGNOSIS — R1011 Right upper quadrant pain: Secondary | ICD-10-CM | POA: Diagnosis not present

## 2017-08-13 LAB — COMPREHENSIVE METABOLIC PANEL
ALT: 12 U/L (ref 0–35)
AST: 15 U/L (ref 0–37)
Albumin: 3.8 g/dL (ref 3.5–5.2)
Alkaline Phosphatase: 54 U/L (ref 39–117)
BUN: 9 mg/dL (ref 6–23)
CO2: 31 meq/L (ref 19–32)
CREATININE: 0.67 mg/dL (ref 0.40–1.20)
Calcium: 9.7 mg/dL (ref 8.4–10.5)
Chloride: 103 mEq/L (ref 96–112)
GFR: 88.73 mL/min (ref >60.00)
Glucose, Bld: 131 mg/dL — ABNORMAL HIGH (ref 70–99)
POTASSIUM: 3.9 meq/L (ref 3.5–5.1)
SODIUM: 140 meq/L (ref 135–145)
Total Bilirubin: 0.5 mg/dL (ref 0.2–1.2)
Total Protein: 6.7 g/dL (ref 6.0–8.3)

## 2017-08-13 LAB — CBC WITH DIFFERENTIAL/PLATELET
BASOS PCT: 0.5 % (ref 0.0–3.0)
Basophils Absolute: 0 10*3/uL (ref 0.0–0.1)
EOS ABS: 0 10*3/uL (ref 0.0–0.7)
EOS PCT: 0.7 % (ref 0.0–5.0)
HCT: 39.3 % (ref 36.0–46.0)
Hemoglobin: 13 g/dL (ref 12.0–15.0)
LYMPHS ABS: 2 10*3/uL (ref 0.7–4.0)
Lymphocytes Relative: 31.1 % (ref 12.0–46.0)
MCHC: 33.1 g/dL (ref 30.0–36.0)
MCV: 96.8 fl (ref 78.0–100.0)
MONO ABS: 0.6 10*3/uL (ref 0.1–1.0)
Monocytes Relative: 8.8 % (ref 3.0–12.0)
NEUTROS ABS: 3.7 10*3/uL (ref 1.4–7.7)
NEUTROS PCT: 58.9 % (ref 43.0–77.0)
PLATELETS: 324 10*3/uL (ref 150.0–400.0)
RBC: 4.06 Mil/uL (ref 3.87–5.11)
RDW: 13.8 % (ref 11.5–15.5)
WBC: 6.3 10*3/uL (ref 4.0–10.5)

## 2017-08-13 NOTE — Progress Notes (Signed)
Diabetes:  Using medications without difficulties:see below Hypoglycemic episodes: no Hyperglycemic episodes:no Feet problems:no Blood Sugars averaging: ~120-140s on BID metformin.   eye exam within last year: yes  She had some RUQ abd pain, under the rib cage.  H/o cholecystectomy.  Waxes and wanes but always present.  Seems to be worse after taking metformin, only started since taking metformin.  No fevers, no vomiting, no diarrhea.  Dec appetite recently.    Meds, vitals, and allergies reviewed.   ROS: Per HPI unless specifically indicated in ROS section   GEN: nad, alert and oriented HEENT: mucous membranes moist NECK: supple w/o LA CV: rrr. PULM: ctab, no inc wob ABD: soft, +bs, slightly tender to palpation in right upper quadrant but no rebound.  Not ttp o/w.   EXT: no edema SKIN: no acute rash  Diabetic foot exam: Normal inspection No skin breakdown No calluses  Normal DP pulses Normal sensation to light touch and monofilament Nails normal

## 2017-08-13 NOTE — Patient Instructions (Addendum)
Stop the metformin and update me in about 1 week about the abdominal pain and your sugar.   Keep working on your diet with cutting out the sweets.   Go to the lab on the way out.  We'll contact you with your lab report. Take care.  Glad to see you.

## 2017-08-14 NOTE — Assessment & Plan Note (Signed)
She had some RUQ abd pain, under the rib cage.  H/o cholecystectomy.  Waxes and wanes but always present.  Seems to be worse after taking metformin, only started since taking metformin.  Presume this to be due to metformin.  Continue work on diabetic diet.  Stop metformin for now.  She will update me about her symptoms and her sugar in the next week, sooner if needed.  Routine cautions given.  See notes on labs today.  She agrees to plan.

## 2017-08-20 DIAGNOSIS — H401123 Primary open-angle glaucoma, left eye, severe stage: Secondary | ICD-10-CM | POA: Diagnosis not present

## 2017-08-20 DIAGNOSIS — H401113 Primary open-angle glaucoma, right eye, severe stage: Secondary | ICD-10-CM | POA: Diagnosis not present

## 2017-08-23 ENCOUNTER — Telehealth: Payer: Self-pay | Admitting: *Deleted

## 2017-08-23 NOTE — Telephone Encounter (Signed)
Copied from [place] [id number]. Topic: General - Other >> Aug 23, 2017  9:52 AM Carolyn Stare wrote: Pt call to give the following numbers that Dr Damita Dunnings requested   08/16/17 142 08/17/17 151 08/18/17 152 08/19/17 129 08/20/17 156 08/21/17 163 08/22/17 154 08/23/17 161

## 2017-08-24 NOTE — Telephone Encounter (Signed)
Those are okay.  Does she feel better off the metformin?  If so, then continue as is and recheck labs at [facility] in 10/2016.  If not feeling better, then let me know.  Thanks.

## 2017-08-24 NOTE — Telephone Encounter (Signed)
Left detailed message on voicemail.  

## 2017-08-30 DIAGNOSIS — M5136 Other intervertebral disc degeneration, lumbar region: Secondary | ICD-10-CM | POA: Diagnosis not present

## 2017-08-30 DIAGNOSIS — M4726 Other spondylosis with radiculopathy, lumbar region: Secondary | ICD-10-CM | POA: Diagnosis not present

## 2017-08-30 DIAGNOSIS — M48061 Spinal stenosis, lumbar region without neurogenic claudication: Secondary | ICD-10-CM | POA: Diagnosis not present

## 2017-09-13 ENCOUNTER — Telehealth: Payer: Self-pay

## 2017-09-13 NOTE — Telephone Encounter (Signed)
Sugar log reviewed.  Please hold records for office visit.  Sugars have recently been 150-200.  We will see pt at office visit.  Thanks.

## 2017-09-13 NOTE — Telephone Encounter (Signed)
Patient's husband came by the office to get patient scheduled for a sooner appointment then October. Patient coming in on 09/17/17. Need to discuss sugar numbers and weight loss per patients husband. Appointment made for 30 minutes. And copy or sugar readings are placed in Dr Josefine Class in basket for review, to review prior to patients appointment-[name] [name], RMA

## 2017-09-17 ENCOUNTER — Encounter: Payer: Self-pay | Admitting: Family Medicine

## 2017-09-17 ENCOUNTER — Ambulatory Visit (INDEPENDENT_AMBULATORY_CARE_PROVIDER_SITE_OTHER): Payer: PPO | Admitting: Family Medicine

## 2017-09-17 DIAGNOSIS — E119 Type 2 diabetes mellitus without complications: Secondary | ICD-10-CM

## 2017-09-17 MED ORDER — METFORMIN HCL ER 500 MG PO TB24: 500.0000 mg | ORAL_TABLET | Freq: Every day | ORAL | 5 refills | Status: DC

## 2017-09-17 MED ORDER — ONETOUCH DELICA LANCETS 33G MISC: 1 refills | Status: DC

## 2017-09-17 MED ORDER — GLUCOSE BLOOD VI STRP: ORAL_STRIP | 1 refills | Status: DC

## 2017-09-17 NOTE — Patient Instructions (Addendum)
Higher protein foods- nuts, avocados, lean meat, protein supplements.   Try adding on extra protein.  Try XR metformin.  Update me as needed. Stop if abdominal pain.  Take care.  Glad to see you.

## 2017-09-17 NOTE — Progress Notes (Signed)
DM2 f/u.  Off metformin in the meantime.  Prev abd sx resolved off metformin.  Her daughter had similar trouble but was able to tolerate XR form of med.  Discussed with patient about options.  Previously her sugar had been around 120-140s on metformin, 150-180s off med.    She has worked hard on her diet.  She has been diligent about her diet and also checking her sugar.  She is taking a premier protein drink supplement.  We talked about healthy protein sources.  Meds, vitals, and allergies reviewed.   ROS: Per HPI unless specifically indicated in ROS section   GEN: nad, alert and oriented HEENT: mucous membranes moist NECK: supple w/o LA CV: rrr. PULM: ctab, no inc wob ABD: soft, +bs EXT: no edema SKIN: no acute rash

## 2017-09-19 NOTE — Assessment & Plan Note (Signed)
Discussed diet, igher protein foods- nuts, avocados, lean meat, protein supplements.   Try adding on extra protein serving.  Try XR metformin.  Update me as needed about tolerance and sugar readings. Stop if abdominal pain.  She agrees.

## 2017-10-09 ENCOUNTER — Ambulatory Visit (INDEPENDENT_AMBULATORY_CARE_PROVIDER_SITE_OTHER): Payer: PPO | Admitting: Family Medicine

## 2017-10-09 ENCOUNTER — Encounter: Payer: Self-pay | Admitting: Family Medicine

## 2017-10-09 ENCOUNTER — Other Ambulatory Visit: Payer: Self-pay | Admitting: *Deleted

## 2017-10-09 VITALS — BP 104/64 | HR 63 | Temp 98.1°F | Ht 60.25 in | Wt 113.0 lb

## 2017-10-09 DIAGNOSIS — Z23 Encounter for immunization: Secondary | ICD-10-CM

## 2017-10-09 DIAGNOSIS — E119 Type 2 diabetes mellitus without complications: Secondary | ICD-10-CM | POA: Diagnosis not present

## 2017-10-09 LAB — POCT GLYCOSYLATED HEMOGLOBIN (HGB A1C): HEMOGLOBIN A1C: 7.8 % — AB (ref 4.0–5.6)

## 2017-10-09 NOTE — Telephone Encounter (Signed)
Patient forgot to ask for refill at office visit.

## 2017-10-09 NOTE — Progress Notes (Signed)
Diabetes:  No meds. She felt better off metformin.  Hypoglycemic episodes:no Hyperglycemic episodes:no Feet problems: no Blood Sugars averaging: 130-170s generally, with rare variation out of that range, none <100 or >200.   eye exam within last year: yes A1c d/w pt.   She has been working on her diet.    Weight loss attributed to diet changes.  No FCNAVD.    PMH and SH reviewed  Meds, vitals, and allergies reviewed.   ROS: Per HPI unless specifically indicated in ROS section   GEN: nad, alert and oriented HEENT: mucous membranes moist NECK: supple w/o LA CV: rrr. PULM: ctab, no inc wob ABD: soft, +bs EXT: no edema SKIN: no acute rash

## 2017-10-09 NOTE — Patient Instructions (Signed)
We will call about your referral.  Molly [name] or Molly [name] will call you if you don't see one of them on the way out. Check to make sure the diabetic teaching is covered by your insurance.  The goal is to add on extra calories without making your diabetic situation worse.  Take care.  Glad to see you.  Plan on a recheck in 3 months.

## 2017-10-10 MED ORDER — LIDOCAINE 5 % EX OINT: TOPICAL_OINTMENT | CUTANEOUS | 1 refills | Status: DC

## 2017-10-10 NOTE — Telephone Encounter (Signed)
Sent. Thanks.   

## 2017-10-10 NOTE — Assessment & Plan Note (Signed)
A1c improved.  Goal A1c between 7 and 8.  I want to avoid hypoglycemia.  She is intolerant of metformin.  We talked about options.  She has weight loss attributed to diet restriction.  She likely needs to gain weight and that complicates her situation with diabetes.  Increasing lean protein is probably the best option to increase her calorie intake.  Discussed options.  Refer to nutrition.  We can add on medication in the future if needed but it would be nice to avoid that if possible.  Discussed with patient.  She agrees with plan.  Update me as needed.  See after visit summary.

## 2017-10-19 ENCOUNTER — Encounter: Payer: PPO | Attending: Family Medicine | Admitting: Dietician

## 2017-10-19 DIAGNOSIS — E119 Type 2 diabetes mellitus without complications: Secondary | ICD-10-CM | POA: Diagnosis not present

## 2017-10-19 DIAGNOSIS — R634 Abnormal weight loss: Secondary | ICD-10-CM

## 2017-10-19 DIAGNOSIS — Z713 Dietary counseling and surveillance: Secondary | ICD-10-CM | POA: Insufficient documentation

## 2017-10-19 NOTE — Progress Notes (Signed)
Medical Nutrition Therapy: Visit start time: 3710  end time: 1500  Assessment:  Diagnosis: Type 2 diabetes Past medical history: HTN Psychosocial issues/ stress concerns: none  Preferred learning method:  . Auditory . Visual . Hands-on   Current weight: 113.3lbs Height: 5'2" Medications, supplements: reconciled list in medical record  Progress and evaluation: Patient reports recent increase in HbA1C to 8%, was started on Metformin which she did not tolerate, and so has been working on diet changes to improve BG control. She is testing fasting BGs daily and reports results average from 145-155; today's reading was 170. She has reduced carbohydrate intake by reducing bread, eliminating potatoes, pasta, and rice. She reports some occasional abdominal pain (especially when taking Metformin) which has decreased her appetite. She would like to gain weight while further reducing BGs. 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   Physical activity: no structured exercise; stays busy most of the day  Dietary Intake:  Usual eating pattern includes 3 meals and 1-2 snacks per day. Dining out frequency: 5-6 meals per week.  Breakfast: 1/2 cup cheerios, few slices of banana + berries/ canned pears (least sugar); frozen biscuit sandwich with egg, cheese, and sausage, diluted cranberry/ cran-grape juice to take meds, hot tea with small amount sugar Snack: premier protein 1/2-1 bottle Lunch: leftovers-- chicken, broccoli, salads; chicken sandwich with 1 slice bread, wendy[employer] bacon cheeseburger with 1/2 or no bread, occasionally a few french fries with ketchup; BBQ with slaw or salad Snack: same as am Supper: same as lunch-- also chicken with vegetables-- green beans, brussels sprouts, broccoli, celery with pimento cheese Snack: none Beverages: water, coffee, diluted sweet tea and cranberry juice  Nutrition Care  Education: Topics covered: weight gain, diabetes Basic nutrition: basic food groups, appropriate nutrient balance, appropriate meal and snack schedule, general nutrition guidelines    Weight gain: appropriate food portions, importance of adequate protein, healthy fats and nuts/ nut butters as options for adding calories, adding snacks  Diabetes: appropriate meal and snack schedule, appropriate carb intake and balance, diabetes medications and actions on BGs   Nutritional Diagnosis:  [place]-2.2 Altered nutrition-related laboratory As related to diabetes.  As evidenced by patient reported HbA1C of 7.2%. [place]-3.2 Unintentional weight loss As related to decreased appetite and reduction in carbohydrate intake.  As evidenced by patient with current BMI of 20.6.  Intervention: Instruction as noted above.   Set goals with direction from patient.    Encouraged evening snack as measure to possibly improve fasting BGs; patient will consider.    No follow-up scheduled at this time; patient will schedule later if needed.   Education Materials given:  . General diet guidelines for Diabetes (large print) . Plate Planner (large print) . Goals/ instructions   Learner/ who was taught:  . Patient  . Spouse Melba Araki  Level of understanding: Marland Kitchen Verbalizes/ demonstrates competency  Demonstrated degree of understanding via:   Teach back Learning barriers: . None   Willingness to learn/ readiness for change: . Eager, change in progress   Monitoring and Evaluation:  Dietary intake, exercise, BG control, and body weight      follow up: prn

## 2017-10-19 NOTE — Patient Instructions (Addendum)
   Increase portions of starchy foods to allow one full serving (or 2) with each meal.   Include a protein food with each meal also. Peanut butter or nuts are healthy foods that provide protein + extra calories.   Try a snack at bedtime and see how it affects the morning blood sugars. Graham crackers and peanut butter, some dry cereal with nuts, fruit with nuts or yogurt are all good snacks.

## 2017-10-23 ENCOUNTER — Other Ambulatory Visit: Payer: Self-pay | Admitting: Family Medicine

## 2017-10-23 MED ORDER — ATENOLOL 25 MG PO TABS: ORAL_TABLET | ORAL | 1 refills | Status: DC

## 2017-10-23 NOTE — Telephone Encounter (Signed)
Copied from [place] [id number]. Topic: Quick Communication - Rx Refill/Question >> Oct 23, 2017 11:20 AM Sheran Luz wrote: Medication:  atenolol (TENORMIN) 25 MG tablet   Pt is requesting refill of this medication.   Preferred Pharmacy (with phone number or street name): [facility], [place] [phone] (Phone) [phone] (Fax)    Agent: Please be advised that RX refills may take up to 3 business days. We ask that you follow-up with your pharmacy.

## 2017-10-23 NOTE — Telephone Encounter (Signed)
Pt seen 01/05/17 re to BP and pt has annual exam scheduled for 2018-01-25.pt notified [facility].verified with pt taking atenolol 25 mg one tab po daily. Pt voiced understanding.

## 2017-11-07 DIAGNOSIS — D692 Other nonthrombocytopenic purpura: Secondary | ICD-10-CM | POA: Diagnosis not present

## 2017-11-07 DIAGNOSIS — L820 Inflamed seborrheic keratosis: Secondary | ICD-10-CM | POA: Diagnosis not present

## 2017-11-07 DIAGNOSIS — L570 Actinic keratosis: Secondary | ICD-10-CM | POA: Diagnosis not present

## 2017-11-07 DIAGNOSIS — Q828 Other specified congenital malformations of skin: Secondary | ICD-10-CM | POA: Diagnosis not present

## 2017-11-07 DIAGNOSIS — Z85828 Personal history of other malignant neoplasm of skin: Secondary | ICD-10-CM | POA: Diagnosis not present

## 2017-11-07 DIAGNOSIS — L400 Psoriasis vulgaris: Secondary | ICD-10-CM | POA: Diagnosis not present

## 2017-11-07 DIAGNOSIS — L821 Other seborrheic keratosis: Secondary | ICD-10-CM | POA: Diagnosis not present

## 2017-11-07 DIAGNOSIS — D1801 Hemangioma of skin and subcutaneous tissue: Secondary | ICD-10-CM | POA: Diagnosis not present

## 2017-11-08 ENCOUNTER — Telehealth: Payer: Self-pay | Admitting: *Deleted

## 2017-11-08 NOTE — Telephone Encounter (Signed)
Pt contacted office to speak Molly [name], but she requested that we speak with her. Pt indicates that she was told she was a diabetic and was started on metformin. She states she was then advised to d/c medication, but reports that she has been following the diabetic diet as suggested,but continues to lose weight. F/u scheduled 11/8

## 2017-11-09 ENCOUNTER — Encounter: Payer: Self-pay | Admitting: Family Medicine

## 2017-11-09 ENCOUNTER — Ambulatory Visit (INDEPENDENT_AMBULATORY_CARE_PROVIDER_SITE_OTHER): Payer: PPO | Admitting: Family Medicine

## 2017-11-09 VITALS — BP 112/64 | HR 66 | Temp 97.8°F | Ht 60.25 in | Wt 112.5 lb

## 2017-11-09 DIAGNOSIS — R634 Abnormal weight loss: Secondary | ICD-10-CM

## 2017-11-09 NOTE — Progress Notes (Signed)
DM2.  Off metformin.  She has prev cholecystectomy.  She has some discomfort with overeating.  Sugar usually 140-160s on home check.    We talked about her situation in general.  She previously had symptoms attributed to hyperglycemia.  She was fatigued.  When we worked that up we found her hyperglycemia/diabetes.  She subsequently had weight loss attributed to a combination of diet changes and intolerance of metformin.  All of that led to a decrease in intake.  Net weight loss is noted, with a 3 pound weight loss over the last approximately 2 months.  She was trying to increase protein intake to help offset previous weight loss.  She comes in today to discuss.  No fevers.  No chills.  No blood in stool other than attributed to a hemorrhoid.  No new symptoms otherwise.  Previous TSH normal.  Previous labs discussed with patient.    PMH and SH reviewed  ROS: Per HPI unless specifically indicated in ROS section   Meds, vitals, and allergies reviewed.   GEN: nad, alert and oriented, thin elderly female. HEENT: mucous membranes moist NECK: supple w/o LA CV: rrr PULM: ctab, no inc wob ABD: soft, +bs, not tender to palpation. EXT: no edema SKIN: no acute rash

## 2017-11-09 NOTE — Patient Instructions (Addendum)
Go to the lab on the way out.  We'll contact you with your lab report. We will call about your referral.  Molly [name] or Molly [name] will call you if you don't see one of them on the way out.  Don't change your meds for now.  Take care.  Glad to see you.

## 2017-11-09 NOTE — Telephone Encounter (Signed)
Will see today. Thanks

## 2017-11-10 LAB — COMPREHENSIVE METABOLIC PANEL
AG Ratio: 1.3 (calc) (ref 1.0–2.5)
ALKALINE PHOSPHATASE (APISO): 80 U/L (ref 33–130)
ALT: 13 U/L (ref 6–29)
AST: 15 U/L (ref 10–35)
Albumin: 3.7 g/dL (ref 3.6–5.1)
BUN: 16 mg/dL (ref 7–25)
CO2: 30 mmol/L (ref 20–32)
CREATININE: 0.61 mg/dL (ref 0.60–0.88)
Calcium: 9.4 mg/dL (ref 8.6–10.4)
Chloride: 104 mmol/L (ref 98–110)
Globulin: 2.9 g/dL (calc) (ref 1.9–3.7)
Glucose, Bld: 132 mg/dL — ABNORMAL HIGH (ref 65–99)
Potassium: 3.9 mmol/L (ref 3.5–5.3)
Sodium: 142 mmol/L (ref 135–146)
Total Bilirubin: 0.5 mg/dL (ref 0.2–1.2)
Total Protein: 6.6 g/dL (ref 6.1–8.1)

## 2017-11-10 LAB — CBC WITH DIFFERENTIAL/PLATELET
BASOS ABS: 31 {cells}/uL (ref 0–200)
BASOS PCT: 0.4 %
EOS ABS: 47 {cells}/uL (ref 15–500)
Eosinophils Relative: 0.6 %
HEMATOCRIT: 42.3 % (ref 35.0–45.0)
HEMOGLOBIN: 13.8 g/dL (ref 11.7–15.5)
LYMPHS ABS: 1966 {cells}/uL (ref 850–3900)
MCH: 30.3 pg (ref 27.0–33.0)
MCHC: 32.6 g/dL (ref 32.0–36.0)
MCV: 93 fL (ref 80.0–100.0)
MPV: 10.2 fL (ref 7.5–12.5)
Monocytes Relative: 6.4 %
NEUTROS ABS: 5257 {cells}/uL (ref 1500–7800)
Neutrophils Relative %: 67.4 %
Platelets: 289 10*3/uL (ref 140–400)
RBC: 4.55 10*6/uL (ref 3.80–5.10)
RDW: 12.2 % (ref 11.0–15.0)
Total Lymphocyte: 25.2 %
WBC: 7.8 10*3/uL (ref 3.8–10.8)
WBCMIX: 499 {cells}/uL (ref 200–950)

## 2017-11-11 DIAGNOSIS — R634 Abnormal weight loss: Secondary | ICD-10-CM | POA: Insufficient documentation

## 2017-11-11 NOTE — Assessment & Plan Note (Addendum)
This is likely multifactorial with a combination of metformin intolerance and also dietary restriction.  We talked about other issues.  She could have pancreatic insufficiency leading to have a relative malabsorptive syndrome.  Check fecal fat.  Rationale discussed with patient.  I would not expect her to have mesenteric ischemia given that she does not have profound abdominal pain with eating.  Given the weight loss in general and her history of uterine cancer many years ago it is reasonable to get CT abdomen pelvis done.  She agrees with plan.  At this point still okay for outpatient follow-up.  Thin appearing but nontoxic.  See notes on labs.  >25 minutes spent in face to face time with patient, >50% spent in counselling or coordination of care.

## 2017-11-12 ENCOUNTER — Other Ambulatory Visit: Payer: PPO

## 2017-11-12 DIAGNOSIS — R634 Abnormal weight loss: Secondary | ICD-10-CM | POA: Diagnosis not present

## 2017-11-13 ENCOUNTER — Ambulatory Visit (INDEPENDENT_AMBULATORY_CARE_PROVIDER_SITE_OTHER)
Admission: RE | Admit: 2017-11-13 | Discharge: 2017-11-13 | Disposition: A | Discharge status: Home / Self Care | Payer: PPO | Source: Ambulatory Visit | Attending: Family Medicine | Admitting: Family Medicine

## 2017-11-13 DIAGNOSIS — K8689 Other specified diseases of pancreas: Secondary | ICD-10-CM | POA: Diagnosis not present

## 2017-11-13 DIAGNOSIS — R634 Abnormal weight loss: Secondary | ICD-10-CM

## 2017-11-13 MED ORDER — IOPAMIDOL (ISOVUE-300) INJECTION 61%
100.0000 mL | Freq: Once | INTRAVENOUS | Status: AC | PRN
  Administered 2017-11-13: 100 mL via INTRAVENOUS

## 2017-11-14 ENCOUNTER — Telehealth: Payer: Self-pay | Admitting: Hematology

## 2017-11-14 ENCOUNTER — Telehealth: Payer: Self-pay | Admitting: Family Medicine

## 2017-11-14 LAB — FECAL FAT, QUALITATIVE
Fat Qual Neutral, Stl: NORMAL
Fat Qual Total, Stl: NORMAL

## 2017-11-14 NOTE — Telephone Encounter (Signed)
Pt has been scheduled to see Dr. Burr Medico on 11/14 at 230pm. I called and spoke to Dr. Damita Dunnings who is referring the pt, and provided the date and time of the appt. Dr. Damita Dunnings will call the pt and give the pt the appt information as well as the location to our facility.

## 2017-11-14 NOTE — Telephone Encounter (Signed)
Pt's daughter want to get more information concerning CT results that was given to pt over the phone by the doctor, also about the appt for the cancer center she want some more understanding why her mom is going

## 2017-11-15 ENCOUNTER — Inpatient Hospital Stay: Payer: PPO | Attending: Hematology | Admitting: Hematology

## 2017-11-15 ENCOUNTER — Encounter: Payer: Self-pay | Admitting: Hematology

## 2017-11-15 ENCOUNTER — Telehealth: Payer: Self-pay | Admitting: Hematology

## 2017-11-15 DIAGNOSIS — Z79899 Other long term (current) drug therapy: Secondary | ICD-10-CM | POA: Insufficient documentation

## 2017-11-15 DIAGNOSIS — K760 Fatty (change of) liver, not elsewhere classified: Secondary | ICD-10-CM | POA: Diagnosis not present

## 2017-11-15 DIAGNOSIS — K439 Ventral hernia without obstruction or gangrene: Secondary | ICD-10-CM | POA: Insufficient documentation

## 2017-11-15 DIAGNOSIS — Z800 Family history of malignant neoplasm of digestive organs: Secondary | ICD-10-CM | POA: Diagnosis not present

## 2017-11-15 DIAGNOSIS — I2510 Atherosclerotic heart disease of native coronary artery without angina pectoris: Secondary | ICD-10-CM | POA: Diagnosis not present

## 2017-11-15 DIAGNOSIS — K219 Gastro-esophageal reflux disease without esophagitis: Secondary | ICD-10-CM | POA: Insufficient documentation

## 2017-11-15 DIAGNOSIS — E119 Type 2 diabetes mellitus without complications: Secondary | ICD-10-CM | POA: Diagnosis not present

## 2017-11-15 DIAGNOSIS — R634 Abnormal weight loss: Secondary | ICD-10-CM

## 2017-11-15 DIAGNOSIS — E785 Hyperlipidemia, unspecified: Secondary | ICD-10-CM | POA: Diagnosis not present

## 2017-11-15 DIAGNOSIS — I10 Essential (primary) hypertension: Secondary | ICD-10-CM | POA: Insufficient documentation

## 2017-11-15 DIAGNOSIS — M419 Scoliosis, unspecified: Secondary | ICD-10-CM | POA: Diagnosis not present

## 2017-11-15 DIAGNOSIS — K429 Umbilical hernia without obstruction or gangrene: Secondary | ICD-10-CM | POA: Insufficient documentation

## 2017-11-15 DIAGNOSIS — Z8542 Personal history of malignant neoplasm of other parts of uterus: Secondary | ICD-10-CM | POA: Insufficient documentation

## 2017-11-15 DIAGNOSIS — K869 Disease of pancreas, unspecified: Secondary | ICD-10-CM

## 2017-11-15 DIAGNOSIS — E039 Hypothyroidism, unspecified: Secondary | ICD-10-CM | POA: Insufficient documentation

## 2017-11-15 DIAGNOSIS — R599 Enlarged lymph nodes, unspecified: Secondary | ICD-10-CM | POA: Insufficient documentation

## 2017-11-15 DIAGNOSIS — I700 Atherosclerosis of aorta: Secondary | ICD-10-CM | POA: Insufficient documentation

## 2017-11-15 DIAGNOSIS — R918 Other nonspecific abnormal finding of lung field: Secondary | ICD-10-CM | POA: Insufficient documentation

## 2017-11-15 DIAGNOSIS — Z87442 Personal history of urinary calculi: Secondary | ICD-10-CM | POA: Diagnosis not present

## 2017-11-15 DIAGNOSIS — K8689 Other specified diseases of pancreas: Secondary | ICD-10-CM | POA: Insufficient documentation

## 2017-11-15 DIAGNOSIS — D490 Neoplasm of unspecified behavior of digestive system: Secondary | ICD-10-CM

## 2017-11-15 NOTE — Telephone Encounter (Signed)
DPR for Molly [name] (daughter) under FYI.

## 2017-11-15 NOTE — Telephone Encounter (Signed)
Pt's daughter Claiborne Billings is retuning your call concerning previous message.

## 2017-11-15 NOTE — Progress Notes (Signed)
[place]  [phone] Fax:([phone]) [place] Note   Patient Care Team: Tonia Ghent, MD as PCP - General (Family Medicine) Shon Hough, MD as Consulting Physician (Ophthalmology) Bond, Tracie Harrier, MD as Referring Physician (Ophthalmology) Jovita Gamma, MD as Consulting Physician (Neurosurgery) Rolm Bookbinder, MD as Consulting Physician (Dermatology) Eula Listen, DDS as Referring Physician (Dentistry) 11/15/2017  REFERRAL PHYSICIAN: Tonia Ghent, MD  CHIEF COMPLAINTS/PURPOSE OF CONSULTATION:  Pancreatic mass with possible peritoneal metastasis   HISTORY OF PRESENTING ILLNESS:  Molly [name] 84 y.o. female is here because of her recent abnormal CT findings.  She was referred by her primary care physician Dr. Damita Dunnings.  She is accompanied by her husband, daughter and son to the clinic today.  She was found to have new onset type 2 diabetes in July 2019, with blood glucose over 200.  She had no abdominal pain, or other symptoms.  She was started on metformin, but she tolerated poorly.  She developed abdominal pain, diarrhea, and low appetite.  Due to the diet control for diabetes, and side effects from metformin, she started losing weight, a total of 14 pounds since then.  She did stop her metformin, and her abdominal discomfort has resolved, however she is still slowly losing weight, and has noticed more fatigued lately.  She is still able to function well at home, but does less lately, and takes naps.   Due to significant weight loss, Dr. Damita Dunnings ordered a CT of abdomen and pelvis, which unfortunately showed a large mass in the pancreatic tail, with pancreatic duct dilatation, and a small peritoneum nodule at his abdominal wall hernia site.  She was referred to [place] for further work-up.  10 system review of systems otherwise negative for headaches, chest discomfort, dyspnea, change of bowel habits, nausea, leg edema, or  other symptoms.  She lives with her husband, who has been married for more than 60 years.  They are independent.  They have 1 daughter and 1 son, who live in [place].    MEDICAL HISTORY:  Past Medical History:  Diagnosis Date  . Diabetes mellitus without complication ([place]) 01/09/9148  . Diverticulosis of colon   . GERD (gastroesophageal reflux disease)   . Glaucoma   . Hyperlipidemia   . Hypertension   . Kidney stones   . MALIGNANT NEOPLASM OF UTERUS PART UNSPECIFIED 1998  . Thyroid disease    hypothyroidism    SURGICAL HISTORY: Past Surgical History:  Procedure Laterality Date  . ABDOMINAL HYSTERECTOMY  12/22/1996   Uterine Cancer Dr Phineas Real  . BREAST BIOPSY  1996   Benign  . BREAST EXCISIONAL BIOPSY Left   . CHOLECYSTECTOMY    . Detached Retina Repair OS    . FACIAL LACERATION REPAIR Left 01/20/2015   Procedure: FACIAL LACERATION REPAIR;  Surgeon: Danice Goltz, MD;  Location: [facility];  Service: Ophthalmology;  Laterality: Left;  . Release R Long Finger A1 Pulley  01/23/2008   Dr Daylene Katayama  . RUPTURED GLOBE EXPLORATION AND REPAIR Left 01/20/2015   Procedure: REPAIR OF RUPTURED GLOBE and FACIAL LACERATIONS;  Surgeon: Danice Goltz, MD;  Location: [name];  Service: Ophthalmology;  Laterality: Left;    SOCIAL HISTORY: Social History   Socioeconomic History  . Marital status: Married    Spouse name: Trilby Drummer  . Number of children: 3  . Years of education: Not on file  . Highest education level: Not on file  Occupational History  . Occupation:  Realtor    Comment: Retired  . 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 Financial resource strain: Not on file  . Food insecurity:    Worry: Not on file    Inability: Not on file  . Transportation needs:    Medical: Not on file    Non-medical: Not on file  Tobacco Use  . Smoking status: Never Smoker  . Smokeless tobacco: Never Used  Substance and Sexual Activity  .  Alcohol use: No    Alcohol/week: 0.0 standard drinks  . Drug use: No  . Sexual activity: Not Currently  Lifestyle  . Physical activity:    Days per week: Not on file    Minutes per session: Not on file  . Stress: Not on file  Relationships  . Social connections:    Talks on phone: Not on file    Gets together: Not on file    Attends religious service: Not on file    Active member of club or organization: Not on file    Attends meetings of clubs or organizations: Not on file    Relationship status: Not on file  . Intimate partner violence:    Fear of current or ex partner: Not on file    Emotionally abused: Not on file    Physically abused: Not on file    Forced sexual activity: Not on file  Other Topics Concern  . Not on file  Social History Narrative   Married 1952   Retired Network engineer and bookkeeper    FAMILY HISTORY: Family History  Problem Relation Age of Onset  . Heart disease Father 51       MI  . Cancer Sister 39       Cervical Cancer  . Cancer Mother 46       gastric cancer  . Colon cancer Maternal Aunt   . Cancer Maternal Grandmother        gastric cancer   . Diabetes Neg Hx   . Breast cancer Neg Hx     ALLERGIES:  is allergic to erythromycin; ezetimibe-simvastatin; latex; macrodantin [nitrofurantoin macrocrystal]; metformin and related; morphine; other; petroleum jelly [skin protectants, misc.]; preparation h [phenyleph-shark liv oil-mo-pet]; procaine hcl; simvastatin; sulfamethoxazole; white petrolatum; adhesive [tape]; and tape.  MEDICATIONS:  Current Outpatient Medications  Medication Sig Dispense Refill  . acetaminophen (TYLENOL) 500 MG tablet Take 1 tablet (500 mg total) by mouth every 6 (six) hours as needed for mild pain or headache. 30 tablet 0  . atenolol (TENORMIN) 25 MG tablet Take 1 tablet ('25mg'$ ) by mouth daily 90 tablet 1  . Biotin 10 MG CAPS Take by mouth.    . Black Cohosh-SoyIsoflav-Magnol (ESTROVEN MENOPAUSE RELIEF) CAPS Take by mouth.      . Blood Glucose Monitoring Suppl ([place]) w/Device KIT Use to check blood sugar once daily and as needed.  Non insulin dependent  E11.9 1 kit 0  . brimonidine (ALPHAGAN P) 0.1 % SOLN Apply 1 drop to eye 2 (two) times daily.    . Cholecalciferol ([employer] VITAMIN D) 2000 UNITS CAPS Take 1 capsule (2,000 Units total) by mouth daily.    . Coenzyme Q10 (COQ10 PO) Take 1 tablet by mouth daily.     . dorzolamide-timolol (COSOPT) 22.3-6.8 MG/ML ophthalmic solution Place 1 drop into the right eye 2 (two) times daily.     Marland Kitchen glucose blood (ONETOUCH VERIO) test strip Use as instructed to check blood sugar once daily  and as needed.  Non insulin dependent.  E11.9 100 each 1  . latanoprost (XALATAN) 0.005 % ophthalmic solution Place 1 drop into the right eye at bedtime.     Marland Kitchen levothyroxine (SYNTHROID, LEVOTHROID) 88 MCG tablet Take 1 tablet by mouth daily 90 tablet 1  . lidocaine (XYLOCAINE) 5 % ointment APPLY A SMALL AMOUNT TO RECTUM 3-4 TIMES PER DAY AS NEEDED 35.44 g 1  . Multiple Vitamin (MULTIVITAMIN) tablet Take 1 tablet by mouth daily.      . Nutritional Supplements (ESTROVEN PO) Take 1 capsule by mouth daily.    Glory Rosebush DELICA LANCETS 40J MISC Use to check blood sugar once daily and as needed.  Non insulin dependent.  E11.9 100 each 1   No current facility-administered medications for this visit.     REVIEW OF SYSTEMS:   Constitutional: Denies fevers, chills or abnormal night sweats, (+) mild fatigue and 15 pound weight loss Eyes: Denies blurriness of vision, double vision or watery eyes Ears, nose, mouth, throat, and face: Denies mucositis or sore throat Respiratory: Denies cough, dyspnea or wheezes Cardiovascular: Denies palpitation, chest discomfort or lower extremity swelling Gastrointestinal:  Denies nausea, heartburn or change in bowel habits, she previously had abdominal pain and diarrhea secondary to metformin, resolved now. Skin: Denies abnormal skin rashes Lymphatics:  Denies new lymphadenopathy or easy bruising Neurological:Denies numbness, tingling or new weaknesses Behavioral/Psych: Mood is stable, no new changes  All other systems were reviewed with the patient and are negative.  PHYSICAL EXAMINATION: ECOG PERFORMANCE STATUS: 1 - Symptomatic but completely ambulatory  Vitals:   11/15/17 1422  BP: (!) 160/83  Pulse: 64  Resp: 17  Temp: (!) 97.4 F (36.3 C)  SpO2: 97%   Filed Weights   11/15/17 1422  Weight: 113 lb 1.6 oz (51.3 kg)    GENERAL:alert, no distress and comfortable SKIN: skin color, texture, turgor are normal, no rashes or significant lesions EYES: normal, conjunctiva are pink and non-injected, sclera clear OROPHARYNX:no exudate, no erythema and lips, buccal mucosa, and tongue normal  NECK: supple, thyroid normal size, non-tender, without nodularity LYMPH:  no palpable lymphadenopathy in the cervical, axillary or inguinal LUNGS: clear to auscultation and percussion with normal breathing effort HEART: regular rate & rhythm and no murmurs and no lower extremity edema ABDOMEN:abdomen soft, non-tender and normal bowel sounds Musculoskeletal:no cyanosis of digits and no clubbing  PSYCH: alert & oriented x 3 with fluent speech NEURO: no focal motor/sensory deficits  LABORATORY DATA:  I have reviewed the data as listed CBC Latest Ref Rng & Units 11/09/2017 08/13/2017 07/04/2017  WBC 3.8 - 10.8 Thousand/uL 7.8 6.3 8.3  Hemoglobin 11.7 - 15.5 g/dL 13.8 13.0 13.5  Hematocrit 35.0 - 45.0 % 42.3 39.3 40.4  Platelets 140 - 400 Thousand/uL 289 324.0 250.0    CMP Latest Ref Rng & Units 11/09/2017 08/13/2017 07/04/2017  Glucose 65 - 99 mg/dL 132(H) 131(H) 239(H)  BUN 7 - 25 mg/dL '16 9 11  '$ Creatinine 0.60 - 0.88 mg/dL 0.61 0.67 0.58  Sodium 135 - 146 mmol/L 142 140 137  Potassium 3.5 - 5.3 mmol/L 3.9 3.9 3.9  Chloride 98 - 110 mmol/L 104 103 101  CO2 20 - 32 mmol/L '30 31 30  '$ Calcium 8.6 - 10.4 mg/dL 9.4 9.7 9.2  Total Protein 6.1 - 8.1  g/dL 6.6 6.7 6.7  Total Bilirubin 0.2 - 1.2 mg/dL 0.5 0.5 0.2  Alkaline Phos 39 - 117 U/L - 54 76  AST 10 - 35 U/L  $'15 15 16  'M$ ALT 6 - 29 U/L '13 12 13     '$ RADIOGRAPHIC STUDIES: I have personally reviewed the radiological images as listed and agreed with the findings in the report. Ct Abdomen Pelvis W Contrast  Result Date: 11/13/2017 CLINICAL DATA:  84 year old female with abnormal weight loss over the past 4 months. History of uterine cancer 2004 post hysterectomy. Prior cholecystectomy. Initial encounter. EXAM: CT ABDOMEN AND PELVIS WITH CONTRAST TECHNIQUE: Multidetector CT imaging of the abdomen and pelvis was performed using the standard protocol following bolus administration of intravenous contrast. CONTRAST:  169m ISOVUE-300 IOPAMIDOL (ISOVUE-300) INJECTION 61% COMPARISON:  02/04/2016 CT. FINDINGS: Lower chest: No worrisome lung base abnormality. Mild cardiomegaly. Prominent coronary artery calcification. Hepatobiliary: Top-normal size fatty liver. Left intrahepatic biliary duct and common bile duct prominence as noted on prior exam. Post cholecystectomy. Subcentimeter low-density structure inferior aspect right lobe liver unchanged from prior exam. Nonspecific 1.2 cm low-density anterior aspect left lobe liver (series 2, image 22) unchanged. Scattered liver calcifications. Pancreas: New from the prior examination is a 4 x 3.2 x 3.3 cm pancreatic body mass which is causing obstruction and subsequent dilation of the pancreatic duct in the region of pancreatic tail. This is highly suspicious for pancreatic malignancy. This impresses upon the splenic vein, superior aspect of the superior mesenteric vein and confluence of the portal vein. This partially surrounds the common hepatic artery. Extension towards the posterior gastric wall which may be involved. Spleen: Increase in number of low-density lesions within the spleen. Not all can be confirmed as simple cysts. This would be an unusual  configuration for metastatic disease which cannot be entirely excluded. Adrenals/Urinary Tract: Left-sided renal low-density lesions some which are cysts and others too small to adequately characterize. No obstructing stone or hydronephrosis. Noncontrast filled views of the urinary bladder unremarkable. No adrenal lesion. Stomach/Bowel: Abnormal appearance of the mid sigmoid colon. Two walls are apposed with masslike projection between the 2 loops. This is in the setting of diverticulosis (series 2 image 60). Vascular/Lymphatic: Significant atherosclerotic changes aorta and aortic branch vessels. Slight ectasia aorta without aneurysm. No large vessel occlusion. Scattered normal size lymph nodes. Reproductive: Post hysterectomy.  No worrisome adnexal mass. Other: No free air. Anterior abdominal wall periumbilical hernia containing fat. Additionally, 3 nodular density seen within the hernia measuring up to 1.6 cm new from prior CT which in the present clinical setting is suspicious for spread of tumor. Musculoskeletal: Scoliosis and degenerative changes lower thoracic and lumbar spine without osseous destructive lesion. IMPRESSION: 1. There are new findings since prior exam with the 2 dominant findings involving the pancreatic body and sigmoid colon as discussed above and below. 2. New from the prior examination is a 4 x 3.2 x 3.3 cm pancreatic body mass which is causing obstruction and subsequent dilation of the pancreatic duct in the region of pancreatic tail. This is highly suspicious for pancreatic malignancy. This impresses upon the splenic vein, superior aspect of the superior mesenteric vein and confluence of the portal vein. This partially surrounds the common hepatic artery. Extension towards the posterior gastric wall which may be involved. Given the significant growth in a relatively short period time, inflammatory process can be considered but is felt to be less likely consideration. 3. Abnormal appearance  of the mid sigmoid colon. Two walls are apposed with masslike projection between the 2 loops. This is in the setting of diverticulosis (series 2 image 60). Question result of muscular hypertrophy, diverticulitis, primary sigmoid mass and less likely  metastatic disease. 4. 3 nodular density seen within anterior abdominal wall hernia measuring up to 1.6 cm are new from prior CT which in the present clinical setting is suspicious for spread of tumor. 5. Increase in number of low-density lesions within the spleen. Not all can be confirmed as simple cysts. This would be an unusual configuration for metastatic disease which cannot be entirely excluded. 6. Fatty liver with intrahepatic biliary duct dilation, tiny low-density lesions and calcifications similar to prior exam. 7. Aortic Atherosclerosis (ICD10-I70.0). These results were called by telephone at the time of interpretation on 11/13/2017 at 4:54 pm to Dr. Elsie Stain , who verbally acknowledged these results. Electronically Signed   By: Genia Del M.D.   On: 11/13/2017 17:27    ASSESSMENT & PLAN: 84 year old Caucasian female, with past medical history of hypertension, recently onset diabetes, uterine cancer, presented with 15 pound weight loss in the past 4 months.  1. Mass in the pancreatic body, with peritoneal nodules  -I have reviewed her CT images with patient and her family members in person with detail explanations  -CT scan showed a 4 cm mass in the pancreatic body, with associated pancreatic duct dilatation, the mass abuts the splenic, SMV and conference of the portal vein, partially surrounds the common hepatic artery, and possible invading gastric wall. It also showed 3 peritoneum nodules in the abdominal hernia area, suspicious for peritoneal metastasis.  Splenic lesions are indeterminate, probably not related. -I recommend a PET scan for further evaluation and staging  -I recommend tissue biopsy to confirm diagnosis.  If the peritoneum  nodules are hypermetabolic on the PET scan, I will request biopsy by IR.  If not, I will recommend EUS biopsy of the pancreatic mass. -I will refer him back to GI Dr. Fuller Plan.  Due to the abnormal appearance of her sigmoid colon, she may need a colonoscopy also. -Discussed the risk of nature of pancreatic cancer, and treatment options briefly.  -I plan to see her back in 1-2 weeks after the above work up   2. DM, HTN, hypothyroidism -F/U with PCP Dr.  Damita Dunnings  3. Genetics -She previously had history of endometrial cancer -She also has family history of gastric cancer in her mother and maternal grandmother, colon cancer in her maternal aunt -I will refer her to genetics, she agrees   Plan -PET scan next week -refer her back to GI Dr. Fuller Plan for EUS biopsy of pancreatic mass and colonoscopy -If PET shows metabolic peritoneal nodules, we can also consider IR biopsy -f/u in 2 weeks after the above workup   No orders of the defined types were placed in this encounter.   All questions were answered. The patient knows to call the clinic with any problems, questions or concerns. I spent 50 minutes counseling the patient face to face. The total time spent in the appointment was 60 minutes and more than 50% was on counseling.     Truitt Merle, MD 11/15/2017

## 2017-11-15 NOTE — Telephone Encounter (Signed)
Called and LMOVM for daughter Claiborne Billings.  No confidential info.   Called pt and husband but daughter wasn't with them.  Advised them that I called Claiborne Billings.  They'll pass that along.  D/w pt about normal fecal fat.  Will await onc appointment today.  I thank all involved.

## 2017-11-15 NOTE — Telephone Encounter (Signed)
I am not in clinic yet today.  I was not in clinic when this phone call came in yesterday. I do not see a DPR to be able to speak to the daughter.  Let me know if one is on the chart. If we do not have a DPR let her know that I cannot disclose information. If there is a DPR, then let me know.  Thanks.

## 2017-11-15 NOTE — Telephone Encounter (Signed)
Scheduled appt per 11/14 los - gave patient AVS and calender per los.   

## 2017-11-16 ENCOUNTER — Other Ambulatory Visit: Payer: Self-pay

## 2017-11-16 ENCOUNTER — Telehealth: Payer: Self-pay

## 2017-11-16 DIAGNOSIS — K8689 Other specified diseases of pancreas: Secondary | ICD-10-CM

## 2017-11-16 DIAGNOSIS — R933 Abnormal findings on diagnostic imaging of other parts of digestive tract: Secondary | ICD-10-CM

## 2017-11-16 NOTE — Telephone Encounter (Signed)
Patient notified of the recommendations and agrees to proceed with colonoscopy and EUS She will come for a pre-visit on 11/19/17 1:00

## 2017-11-16 NOTE — Telephone Encounter (Signed)
Late entry.  I called her daughter Claiborne Billings after clinic yesterday.  She thanked me for calling, I thanked her for taking the call.  She went to her mother's appointment yesterday with oncology (it was [facility] first day at work with a new job and I told her I would write her a work note).  Previous CT discussed, clinical scenario discussed.  At the time of the call I was still awaiting the final reports from oncology.  We talked about options at this point and how she could support her family.  It is likely reasonable to proceed with information gathering (PET scan, labs, etc.) so the patient can make an informed choice about her care.  I will await the follow-up tests and they can update me as needed.  I thank all involved.  Please mail the second copy of the work note to [employer] (the first had a typographical error that I corrected) [address] [place], [place] [address]

## 2017-11-16 NOTE — Telephone Encounter (Signed)
Letter mailed to [facility] as instructed.

## 2017-11-16 NOTE — Telephone Encounter (Signed)
Left message for patient to call back Dr. Fuller Plan does not have an opening until 12/23.  Dr. Ardis Hughs is willing to do EUS/colon on 12/12.  Patient has been scheduled for 12/13/17 10:45.

## 2017-11-16 NOTE — Telephone Encounter (Signed)
-----   Message from Milus Banister, MD sent at 11/16/2017  7:23 AM EST ----- [name], Thanks, we'll take care of this.  Norberto Sorenson, Do you think you can get her in for colonoscopy or sigmoid soon.  I can take care of the EUS on 12/5 OR both the EUS and colonoscopy on 12/12?  Patty, Can you call her about upper EUS, first available with myself or Dr. Rush Landmark for pancreatic mass. I have availability on 12/5 (or 12/12 if she needs EUS and colonoscopy still, see above).  Thanks  Wynetta Fines    ----- Message ----- From: Truitt Merle, MD Sent: 11/15/2017  10:48 PM EST To: Milus Banister, MD, Ladene Artist, MD, #  Dr. Fuller Plan,  This is a 84 yo lady with weight loss and CT showed a large mass in pancreatic body, possible peritoneal mets. You did her EGD before, could you see her back for EUS biopsy of the pancreatic mass? Her CT also showed abnormal sigmoid colon mucosa, she may need colonoscopy or flex sigmoidoscopy?    I also ordered a PET, if the peritoneal nodules are hypermetabolic on the PET, I may ask IR to biopsy it, then we may not need to biopsy the pancreatic mass.  Thanks  [name]

## 2017-11-19 ENCOUNTER — Ambulatory Visit (AMBULATORY_SURGERY_CENTER): Payer: Self-pay | Admitting: *Deleted

## 2017-11-19 VITALS — Ht 62.0 in | Wt 112.0 lb

## 2017-11-19 DIAGNOSIS — K8689 Other specified diseases of pancreas: Secondary | ICD-10-CM

## 2017-11-19 DIAGNOSIS — R933 Abnormal findings on diagnostic imaging of other parts of digestive tract: Secondary | ICD-10-CM

## 2017-11-19 MED ORDER — NA SULFATE-K SULFATE-MG SULF 17.5-3.13-1.6 GM/177ML PO SOLN: 1.0000 | Freq: Once | ORAL | 0 refills | Status: AC

## 2017-11-19 MED ORDER — PEG 3350-KCL-NA BICARB-NACL 420 G PO SOLR: 4000.0000 mL | Freq: Once | ORAL | 0 refills | Status: AC

## 2017-11-19 NOTE — Progress Notes (Signed)
No egg or soy allergy known to patient  No issues with past sedation with any surgeries  or procedures, no intubation problems  No diet pills per patient No home 02 use per patient  No blood thinners per patient  Pt denies issues with constipation  No A fib or A flutter  EMMI video sent to pt's e mail -- pt states she cannot use e mail - gave booklet  Pt and husband state pt cannot drink the Golytely- I explained that suprep will be $90 plus dollars and they say this is fine- Husband said there is no way she can drink Golytely- I called [facility] cancelled the Golytely- pt and husband aware

## 2017-11-26 ENCOUNTER — Ambulatory Visit
Admission: RE | Admit: 2017-11-26 | Discharge: 2017-11-26 | Disposition: A | Discharge status: Home / Self Care | Payer: PPO | Source: Ambulatory Visit | Attending: Hematology | Admitting: Hematology

## 2017-11-26 DIAGNOSIS — I700 Atherosclerosis of aorta: Secondary | ICD-10-CM | POA: Diagnosis not present

## 2017-11-26 DIAGNOSIS — I2510 Atherosclerotic heart disease of native coronary artery without angina pectoris: Secondary | ICD-10-CM | POA: Insufficient documentation

## 2017-11-26 DIAGNOSIS — D490 Neoplasm of unspecified behavior of digestive system: Secondary | ICD-10-CM

## 2017-11-26 DIAGNOSIS — C259 Malignant neoplasm of pancreas, unspecified: Secondary | ICD-10-CM | POA: Insufficient documentation

## 2017-11-26 DIAGNOSIS — K429 Umbilical hernia without obstruction or gangrene: Secondary | ICD-10-CM | POA: Insufficient documentation

## 2017-11-26 DIAGNOSIS — I517 Cardiomegaly: Secondary | ICD-10-CM | POA: Diagnosis not present

## 2017-11-26 DIAGNOSIS — R918 Other nonspecific abnormal finding of lung field: Secondary | ICD-10-CM | POA: Diagnosis not present

## 2017-11-26 LAB — GLUCOSE, CAPILLARY: GLUCOSE-CAPILLARY: 133 mg/dL — AB (ref 70–99)

## 2017-11-26 MED ORDER — FLUDEOXYGLUCOSE F - 18 (FDG) INJECTION
5.8000 | Freq: Once | INTRAVENOUS | Status: AC | PRN
  Administered 2017-11-26: 5.96 via INTRAVENOUS

## 2017-11-26 NOTE — H&P (View-Only) (Signed)
[place]  [phone] [phone]  Clinic Follow up Note   Patient Care Team: Tonia Ghent, MD as PCP - General (Family Medicine) Shon Hough, MD as Consulting Physician (Ophthalmology) Bond, Tracie Harrier, MD as Referring Physician (Ophthalmology) Jovita Gamma, MD as Consulting Physician (Neurosurgery) Rolm Bookbinder, MD as Consulting Physician (Dermatology) Eula Listen, DDS as Referring Physician (Dentistry) Ladene Artist, MD as Consulting Physician (Gastroenterology) 11/27/2017   Chief Complaint: F/u on pancreatic cancer, likely metastatic   INTERVAL HISTORY: Molly [name] is a 84 y.o. female who is here for follow-up and discuss PET scan findings. She is here with her family members. She lost some weight but she is otherwise doing well. Her BMs are regular and she denies constipation, diarrhea, or blood in stools.   Pertinent positives and negatives of review of systems are listed and detailed within the above HPI.   REVIEW OF SYSTEMS:   Constitutional: Denies fevers, chills (+) weight loss Eyes: Denies blurriness of vision Ears, nose, mouth, throat, and face: Denies mucositis or sore throat Respiratory: Denies cough, dyspnea or wheezes Cardiovascular: Denies palpitation, chest discomfort or lower extremity swelling Gastrointestinal:  Denies nausea, heartburn or change in bowel habits Skin: Denies abnormal skin rashes Lymphatics: Denies new lymphadenopathy or easy bruising Neurological:Denies numbness, tingling or new weaknesses Behavioral/Psych: Mood is stable, no new changes  All other systems were reviewed with the patient and are negative.  MEDICAL HISTORY:  Past Medical History:  Diagnosis Date  . Cataract    removed both eyes  . Diabetes mellitus without complication ([place]) 07/02/624   diet controlled   . Diverticulosis of colon   . GERD (gastroesophageal reflux disease)   . Glaucoma   . Hyperlipidemia    . Hypertension   . Kidney stones   . MALIGNANT NEOPLASM OF UTERUS PART UNSPECIFIED 1998  . Thyroid disease    hypothyroidism    SURGICAL HISTORY: Past Surgical History:  Procedure Laterality Date  . ABDOMINAL HYSTERECTOMY  12/22/1996   Uterine Cancer Dr Phineas Real  . BREAST BIOPSY  1996   Benign  . BREAST EXCISIONAL BIOPSY Left   . CHOLECYSTECTOMY    . COLONOSCOPY    . Detached Retina Repair OS    . FACIAL LACERATION REPAIR Left 01/20/2015   Procedure: FACIAL LACERATION REPAIR;  Surgeon: Danice Goltz, MD;  Location: [place];  Service: Ophthalmology;  Laterality: Left;  . Release R Long Finger A1 Pulley  01/23/2008   Dr Daylene Katayama  . RUPTURED GLOBE EXPLORATION AND REPAIR Left 01/20/2015   Procedure: REPAIR OF RUPTURED GLOBE and FACIAL LACERATIONS;  Surgeon: Danice Goltz, MD;  Location: [place];  Service: Ophthalmology;  Laterality: Left;  . UPPER GASTROINTESTINAL ENDOSCOPY      I have reviewed the social history and family history with the patient and they are unchanged from previous note.  ALLERGIES:  is allergic to erythromycin; ezetimibe-simvastatin; latex; macrodantin [nitrofurantoin macrocrystal]; metformin and related; morphine; other; petroleum jelly [skin protectants, misc.]; preparation h [phenyleph-shark liv oil-mo-pet]; procaine hcl; simvastatin; sulfamethoxazole; white petrolatum; adhesive [tape]; and tape.  MEDICATIONS:  Current Outpatient Medications  Medication Sig Dispense Refill  . acetaminophen (TYLENOL) 500 MG tablet Take 1 tablet (500 mg total) by mouth every 6 (six) hours as needed for mild pain or headache. 30 tablet 0  . atenolol (TENORMIN) 25 MG tablet Take 1 tablet ('25mg'$ ) by mouth daily 90 tablet 1  . Biotin 10 MG CAPS Take by mouth.    Renard Hamper Cohosh-SoyIsoflav-Magnol (  ESTROVEN MENOPAUSE RELIEF) CAPS Take by mouth.    . Blood Glucose Monitoring Suppl ([place]) w/Device KIT Use to check blood sugar once daily and as needed.   Non insulin dependent  E11.9 1 kit 0  . brimonidine (ALPHAGAN P) 0.1 % SOLN Apply 1 drop to eye 2 (two) times daily.    . Cholecalciferol ([employer] VITAMIN D) 2000 UNITS CAPS Take 1 capsule (2,000 Units total) by mouth daily.    . clobetasol (TEMOVATE) 0.05 % external solution APPLY 1ML TO DAMP SCALP AS DIRECTED X 2-4WEEKS AS NEEDED FLARES  2  . Coenzyme Q10 (COQ10 PO) Take 1 tablet by mouth daily.     . dorzolamide-timolol (COSOPT) 22.3-6.8 MG/ML ophthalmic solution Place 1 drop into the right eye 2 (two) times daily.     Marland Kitchen glucose blood (ONETOUCH VERIO) test strip Use as instructed to check blood sugar once daily and as needed.  Non insulin dependent.  E11.9 100 each 1  . latanoprost (XALATAN) 0.005 % ophthalmic solution Place 1 drop into the right eye at bedtime.     Marland Kitchen levothyroxine (SYNTHROID, LEVOTHROID) 88 MCG tablet Take 1 tablet by mouth daily 90 tablet 1  . lidocaine (XYLOCAINE) 5 % ointment APPLY A SMALL AMOUNT TO RECTUM 3-4 TIMES PER DAY AS NEEDED 35.44 g 1  . Multiple Vitamin (MULTIVITAMIN) tablet Take 1 tablet by mouth daily.      . Nutritional Supplements (ESTROVEN PO) Take 1 capsule by mouth daily.    Glory Rosebush DELICA LANCETS 24M MISC Use to check blood sugar once daily and as needed.  Non insulin dependent.  E11.9 100 each 1   No current facility-administered medications for this visit.     PHYSICAL EXAMINATION: ECOG PERFORMANCE STATUS: 1 - Symptomatic but completely ambulatory  Vitals:   11/27/17 1126  BP: 139/83  Pulse: 62  Resp: 18  Temp: 97.9 F (36.6 C)  SpO2: 97%   Filed Weights   11/27/17 1126  Weight: 111 lb 12.8 oz (50.7 kg)    GENERAL:alert, no distress and comfortable SKIN: skin color, texture, turgor are normal, no rashes or significant lesions EYES: normal, Conjunctiva are pink and non-injected, sclera clear OROPHARYNX:no exudate, no erythema and lips, buccal mucosa, and tongue normal  NECK: supple, thyroid normal size, non-tender, without  nodularity LYMPH:  no palpable lymphadenopathy in the cervical, axillary or inguinal LUNGS: clear to auscultation and percussion with normal breathing effort HEART: regular rate & rhythm and no murmurs and no lower extremity edema ABDOMEN:abdomen soft, non-tender and normal bowel sounds Musculoskeletal:no cyanosis of digits and no clubbing  NEURO: alert & oriented x 3 with fluent speech, no focal motor/sensory deficits  LABORATORY DATA:  I have reviewed the data as listed CBC Latest Ref Rng & Units 11/27/2017 11/09/2017 08/13/2017  WBC 4.0 - 10.5 K/uL 6.7 7.8 6.3  Hemoglobin 12.0 - 15.0 g/dL 13.3 13.8 13.0  Hematocrit 36.0 - 46.0 % 42.0 42.3 39.3  Platelets 150 - 400 K/uL 288 289 324.0     CMP Latest Ref Rng & Units 11/27/2017 11/09/2017 08/13/2017  Glucose 70 - 99 mg/dL 204(H) 132(H) 131(H)  BUN 8 - 23 mg/dL '13 16 9  '$ Creatinine 0.44 - 1.00 mg/dL 0.76 0.61 0.67  Sodium 135 - 145 mmol/L 141 142 140  Potassium 3.5 - 5.1 mmol/L 3.4(L) 3.9 3.9  Chloride 98 - 111 mmol/L 103 104 103  CO2 22 - 32 mmol/L '30 30 31  '$ Calcium 8.9 - 10.3 mg/dL 9.5 9.4 9.7  Total Protein 6.5 - 8.1 g/dL 6.9 6.6 6.7  Total Bilirubin 0.3 - 1.2 mg/dL 0.6 0.5 0.5  Alkaline Phos 38 - 126 U/L 83 - 54  AST 15 - 41 U/L '16 15 15  '$ ALT 0 - 44 U/L '14 13 12   '$ Tumor Marker CA 19.9 11/27/2017: Pending    PATHOLOGY   RADIOGRAPHIC STUDIES: I have personally reviewed the radiological images as listed and agreed with the findings in the report.  11/26/2017 PET scan IMPRESSION: 1. Approximately 5 cm hypermetabolic mass in the pancreatic body and tail, maximum SUV 5.6, with morphologic findings suspicious for pancreatic adenocarcinoma. 2. However, there is also a hypermetabolic mass in the sigmoid colon with adjacent paracolic adenopathy. This mass has a maximum SUV of 7.4. A synchronous colon cancer is a distinct possibility. 3. Abnormal hypermetabolic nodules in the omentum, both in and adjacent to a small  infraumbilical hernia, suspicious for metastatic disease. 4. Mildly hypermetabolic bilateral hilar lymph nodes. There is also some low-grade accentuated metabolic activity along a region of lingular atelectasis with some mild adjacent nodularity, as well as clustered nodularity posteriorly in the right upper lobe. I am not completely convinced that the thoracic findings represent malignancy; atypical infectious bronchiolitis with reactive lymph nodes might have a similar appearance, but this certainly deserves surveillance. Also consider a baseline CT of the chest with contrast. 5. Other imaging findings of potential clinical significance: Aortic Atherosclerosis (ICD10-I70.0). Coronary atherosclerosis with mild-to-moderate cardiomegaly. Prominent stool throughout the colon favors constipation. Old left facial fractures.  11/13/2017 CT Abdomen IMPRESSION: 1. There are new findings since prior exam with the 2 dominant findings involving the pancreatic body and sigmoid colon as discussed above and below. 2. New from the prior examination is a 4 x 3.2 x 3.3 cm pancreatic body mass which is causing obstruction and subsequent dilation of the pancreatic duct in the region of pancreatic tail. This is highly suspicious for pancreatic malignancy. This impresses upon the splenic vein, superior aspect of the superior mesenteric vein and confluence of the portal vein. This partially surrounds the common hepatic artery. Extension towards the posterior gastric wall which may be involved. Given the significant growth in a relatively short period time, inflammatory process can be considered but is felt to be less likely consideration. 3. Abnormal appearance of the mid sigmoid colon. Two walls are apposed with masslike projection between the 2 loops. This is in the setting of diverticulosis (series 2 image 60). Question result of muscular hypertrophy, diverticulitis, primary sigmoid mass and  less likely metastatic disease. 4. 3 nodular density seen within anterior abdominal wall hernia measuring up to 1.6 cm are new from prior CT which in the present clinical setting is suspicious for spread of tumor. 5. Increase in number of low-density lesions within the spleen. Not all can be confirmed as simple cysts. This would be an unusual configuration for metastatic disease which cannot be entirely excluded. 6. Fatty liver with intrahepatic biliary duct dilation, tiny low-density lesions and calcifications similar to prior exam. 7. Aortic Atherosclerosis (ICD10-I70.0).  ASSESSMENT & PLAN:  Molly [name] is a 84 y.o. female with history of  1. Probable pancreatic cancer, with peritoneal nodules, and sigmoid colon mass  -Pt presented with significant weight loss, work-up showed large 4cm  mass in the body of pancreas and multiple peritoneal nodule.  -I reviewed and discussed her PET scan results.  I reviewed the image in person and with patient and her family members.  Both the pancreatic mass,  peritoneal nodules are hypermetabolic, PET scan also showed significant metabolic activity in the sigmoid colon, suspicious for primary colon cancer -I recommend IR biopsy of the peritoneal nodule if it is feasible.  If the biopsy confirms metastatic pancreatic cancer, then she does not need EUS and pancreatic mass biopsy.  -However she does need colonoscopy to rule out simultaneous primary colon cancer.  -I will refer to IR for biopsy.  -she is scheduled with Dr. Ardis Hughs for endoscopy on 12/12  -Labs reviewed, CBC is WNLs. And CMP showed K 3.4 BG 204 and albumin 3.4. CA 19.9 pending.  -I advised her to increase calorie and protein intake. I will refer to a dietician.  -f/u after biopsy.   2. DM, HTN, and Hypothyroidism -f/u with Dr. Damita Dunnings  3. Genetics -if she is confirmed with pancreatic cancer, will refer her to genetics    Plan -I will refer to IR for peritoneal nodule biopsy to be  done as soon as possible  -she is scheduled for EUS and colonoscopy with Dr. Ardis Hughs on December 12.  If her peritoneal nodule biopsy confirmed pancreatic cancer, then will cancel EUS. -f/u after biopsy -I will refer to a dietician   No problem-specific Assessment & Plan notes found for this encounter.   Orders Placed This Encounter  Procedures  . CT Biopsy    Standing Status:   Future    Standing Expiration Date:   11/27/2018    Order Specific Question:   Lab orders requested (DO NOT place separate lab orders, these will be automatically ordered during procedure specimen collection):    Answer:   Surgical Pathology    Order Specific Question:   Reason for Exam (SYMPTOM  OR DIAGNOSIS REQUIRED)    Answer:   peritoneal nodule biopsy rule out/confirm metastasis    Order Specific Question:   Preferred imaging location?    Answer:   [facility]    Order Specific Question:   Radiology Contrast Protocol - do NOT remove file path    Answer:   [email or web address]   All questions were answered. The patient knows to call the clinic with any problems, questions or concerns. No barriers to learning was detected. I spent 20 minutes counseling the patient face to face. The total time spent in the appointment was 25 minutes and more than 50% was on counseling and review of test results  I, Noor Dweik am acting as scribe for Dr. Truitt Merle.  I have reviewed the above documentation for accuracy and completeness, and I agree with the above.      Truitt Merle, MD 11/27/2017

## 2017-11-26 NOTE — Progress Notes (Signed)
[place]  [phone] [phone]  Clinic Follow up Note   Patient Care Team: Tonia Ghent, MD as PCP - General (Family Medicine) Shon Hough, MD as Consulting Physician (Ophthalmology) Bond, Tracie Harrier, MD as Referring Physician (Ophthalmology) Jovita Gamma, MD as Consulting Physician (Neurosurgery) Rolm Bookbinder, MD as Consulting Physician (Dermatology) Eula Listen, DDS as Referring Physician (Dentistry) Ladene Artist, MD as Consulting Physician (Gastroenterology) 11/27/2017   Chief Complaint: F/u on pancreatic cancer, likely metastatic   INTERVAL HISTORY: Molly [name] is a 84 y.o. female who is here for follow-up and discuss PET scan findings. She is here with her family members. She lost some weight but she is otherwise doing well. Her BMs are regular and she denies constipation, diarrhea, or blood in stools.   Pertinent positives and negatives of review of systems are listed and detailed within the above HPI.   REVIEW OF SYSTEMS:   Constitutional: Denies fevers, chills (+) weight loss Eyes: Denies blurriness of vision Ears, nose, mouth, throat, and face: Denies mucositis or sore throat Respiratory: Denies cough, dyspnea or wheezes Cardiovascular: Denies palpitation, chest discomfort or lower extremity swelling Gastrointestinal:  Denies nausea, heartburn or change in bowel habits Skin: Denies abnormal skin rashes Lymphatics: Denies new lymphadenopathy or easy bruising Neurological:Denies numbness, tingling or new weaknesses Behavioral/Psych: Mood is stable, no new changes  All other systems were reviewed with the patient and are negative.  MEDICAL HISTORY:  Past Medical History:  Diagnosis Date  . Cataract    removed both eyes  . Diabetes mellitus without complication ([place]) 03/08/6281   diet controlled   . Diverticulosis of colon   . GERD (gastroesophageal reflux disease)   . Glaucoma   . Hyperlipidemia    . Hypertension   . Kidney stones   . MALIGNANT NEOPLASM OF UTERUS PART UNSPECIFIED 1998  . Thyroid disease    hypothyroidism    SURGICAL HISTORY: Past Surgical History:  Procedure Laterality Date  . ABDOMINAL HYSTERECTOMY  12/22/1996   Uterine Cancer Dr Phineas Real  . BREAST BIOPSY  1996   Benign  . BREAST EXCISIONAL BIOPSY Left   . CHOLECYSTECTOMY    . COLONOSCOPY    . Detached Retina Repair OS    . FACIAL LACERATION REPAIR Left 01/20/2015   Procedure: FACIAL LACERATION REPAIR;  Surgeon: Danice Goltz, MD;  Location: [facility];  Service: Ophthalmology;  Laterality: Left;  . Release R Long Finger A1 Pulley  01/23/2008   Dr Daylene Katayama  . RUPTURED GLOBE EXPLORATION AND REPAIR Left 01/20/2015   Procedure: REPAIR OF RUPTURED GLOBE and FACIAL LACERATIONS;  Surgeon: Danice Goltz, MD;  Location: [facility];  Service: Ophthalmology;  Laterality: Left;  . UPPER GASTROINTESTINAL ENDOSCOPY      I have reviewed the social history and family history with the patient and they are unchanged from previous note.  ALLERGIES:  is allergic to erythromycin; ezetimibe-simvastatin; latex; macrodantin [nitrofurantoin macrocrystal]; metformin and related; morphine; other; petroleum jelly [skin protectants, misc.]; preparation h [phenyleph-shark liv oil-mo-pet]; procaine hcl; simvastatin; sulfamethoxazole; white petrolatum; adhesive [tape]; and tape.  MEDICATIONS:  Current Outpatient Medications  Medication Sig Dispense Refill  . acetaminophen (TYLENOL) 500 MG tablet Take 1 tablet (500 mg total) by mouth every 6 (six) hours as needed for mild pain or headache. 30 tablet 0  . atenolol (TENORMIN) 25 MG tablet Take 1 tablet ('25mg'$ ) by mouth daily 90 tablet 1  . Biotin 10 MG CAPS Take by mouth.    Renard Hamper Cohosh-SoyIsoflav-Magnol (  ESTROVEN MENOPAUSE RELIEF) CAPS Take by mouth.    . Blood Glucose Monitoring Suppl ([place]) w/Device KIT Use to check blood sugar once daily and as needed.   Non insulin dependent  E11.9 1 kit 0  . brimonidine (ALPHAGAN P) 0.1 % SOLN Apply 1 drop to eye 2 (two) times daily.    . Cholecalciferol ([employer] VITAMIN D) 2000 UNITS CAPS Take 1 capsule (2,000 Units total) by mouth daily.    . clobetasol (TEMOVATE) 0.05 % external solution APPLY 1ML TO DAMP SCALP AS DIRECTED X 2-4WEEKS AS NEEDED FLARES  2  . Coenzyme Q10 (COQ10 PO) Take 1 tablet by mouth daily.     . dorzolamide-timolol (COSOPT) 22.3-6.8 MG/ML ophthalmic solution Place 1 drop into the right eye 2 (two) times daily.     Marland Kitchen glucose blood (ONETOUCH VERIO) test strip Use as instructed to check blood sugar once daily and as needed.  Non insulin dependent.  E11.9 100 each 1  . latanoprost (XALATAN) 0.005 % ophthalmic solution Place 1 drop into the right eye at bedtime.     Marland Kitchen levothyroxine (SYNTHROID, LEVOTHROID) 88 MCG tablet Take 1 tablet by mouth daily 90 tablet 1  . lidocaine (XYLOCAINE) 5 % ointment APPLY A SMALL AMOUNT TO RECTUM 3-4 TIMES PER DAY AS NEEDED 35.44 g 1  . Multiple Vitamin (MULTIVITAMIN) tablet Take 1 tablet by mouth daily.      . Nutritional Supplements (ESTROVEN PO) Take 1 capsule by mouth daily.    Glory Rosebush DELICA LANCETS 48G MISC Use to check blood sugar once daily and as needed.  Non insulin dependent.  E11.9 100 each 1   No current facility-administered medications for this visit.     PHYSICAL EXAMINATION: ECOG PERFORMANCE STATUS: 1 - Symptomatic but completely ambulatory  Vitals:   11/27/17 1126  BP: 139/83  Pulse: 62  Resp: 18  Temp: 97.9 F (36.6 C)  SpO2: 97%   Filed Weights   11/27/17 1126  Weight: 111 lb 12.8 oz (50.7 kg)    GENERAL:alert, no distress and comfortable SKIN: skin color, texture, turgor are normal, no rashes or significant lesions EYES: normal, Conjunctiva are pink and non-injected, sclera clear OROPHARYNX:no exudate, no erythema and lips, buccal mucosa, and tongue normal  NECK: supple, thyroid normal size, non-tender, without  nodularity LYMPH:  no palpable lymphadenopathy in the cervical, axillary or inguinal LUNGS: clear to auscultation and percussion with normal breathing effort HEART: regular rate & rhythm and no murmurs and no lower extremity edema ABDOMEN:abdomen soft, non-tender and normal bowel sounds Musculoskeletal:no cyanosis of digits and no clubbing  NEURO: alert & oriented x 3 with fluent speech, no focal motor/sensory deficits  LABORATORY DATA:  I have reviewed the data as listed CBC Latest Ref Rng & Units 11/27/2017 11/09/2017 08/13/2017  WBC 4.0 - 10.5 K/uL 6.7 7.8 6.3  Hemoglobin 12.0 - 15.0 g/dL 13.3 13.8 13.0  Hematocrit 36.0 - 46.0 % 42.0 42.3 39.3  Platelets 150 - 400 K/uL 288 289 324.0     CMP Latest Ref Rng & Units 11/27/2017 11/09/2017 08/13/2017  Glucose 70 - 99 mg/dL 204(H) 132(H) 131(H)  BUN 8 - 23 mg/dL '13 16 9  '$ Creatinine 0.44 - 1.00 mg/dL 0.76 0.61 0.67  Sodium 135 - 145 mmol/L 141 142 140  Potassium 3.5 - 5.1 mmol/L 3.4(L) 3.9 3.9  Chloride 98 - 111 mmol/L 103 104 103  CO2 22 - 32 mmol/L '30 30 31  '$ Calcium 8.9 - 10.3 mg/dL 9.5 9.4 9.7  Total Protein 6.5 - 8.1 g/dL 6.9 6.6 6.7  Total Bilirubin 0.3 - 1.2 mg/dL 0.6 0.5 0.5  Alkaline Phos 38 - 126 U/L 83 - 54  AST 15 - 41 U/L '16 15 15  '$ ALT 0 - 44 U/L '14 13 12   '$ Tumor Marker CA 19.9 11/27/2017: Pending    PATHOLOGY   RADIOGRAPHIC STUDIES: I have personally reviewed the radiological images as listed and agreed with the findings in the report.  11/26/2017 PET scan IMPRESSION: 1. Approximately 5 cm hypermetabolic mass in the pancreatic body and tail, maximum SUV 5.6, with morphologic findings suspicious for pancreatic adenocarcinoma. 2. However, there is also a hypermetabolic mass in the sigmoid colon with adjacent paracolic adenopathy. This mass has a maximum SUV of 7.4. A synchronous colon cancer is a distinct possibility. 3. Abnormal hypermetabolic nodules in the omentum, both in and adjacent to a small  infraumbilical hernia, suspicious for metastatic disease. 4. Mildly hypermetabolic bilateral hilar lymph nodes. There is also some low-grade accentuated metabolic activity along a region of lingular atelectasis with some mild adjacent nodularity, as well as clustered nodularity posteriorly in the right upper lobe. I am not completely convinced that the thoracic findings represent malignancy; atypical infectious bronchiolitis with reactive lymph nodes might have a similar appearance, but this certainly deserves surveillance. Also consider a baseline CT of the chest with contrast. 5. Other imaging findings of potential clinical significance: Aortic Atherosclerosis (ICD10-I70.0). Coronary atherosclerosis with mild-to-moderate cardiomegaly. Prominent stool throughout the colon favors constipation. Old left facial fractures.  11/13/2017 CT Abdomen IMPRESSION: 1. There are new findings since prior exam with the 2 dominant findings involving the pancreatic body and sigmoid colon as discussed above and below. 2. New from the prior examination is a 4 x 3.2 x 3.3 cm pancreatic body mass which is causing obstruction and subsequent dilation of the pancreatic duct in the region of pancreatic tail. This is highly suspicious for pancreatic malignancy. This impresses upon the splenic vein, superior aspect of the superior mesenteric vein and confluence of the portal vein. This partially surrounds the common hepatic artery. Extension towards the posterior gastric wall which may be involved. Given the significant growth in a relatively short period time, inflammatory process can be considered but is felt to be less likely consideration. 3. Abnormal appearance of the mid sigmoid colon. Two walls are apposed with masslike projection between the 2 loops. This is in the setting of diverticulosis (series 2 image 60). Question result of muscular hypertrophy, diverticulitis, primary sigmoid mass and  less likely metastatic disease. 4. 3 nodular density seen within anterior abdominal wall hernia measuring up to 1.6 cm are new from prior CT which in the present clinical setting is suspicious for spread of tumor. 5. Increase in number of low-density lesions within the spleen. Not all can be confirmed as simple cysts. This would be an unusual configuration for metastatic disease which cannot be entirely excluded. 6. Fatty liver with intrahepatic biliary duct dilation, tiny low-density lesions and calcifications similar to prior exam. 7. Aortic Atherosclerosis (ICD10-I70.0).  ASSESSMENT & PLAN:  Molly [name] is a 84 y.o. female with history of  1. Probable pancreatic cancer, with peritoneal nodules, and sigmoid colon mass  -Pt presented with significant weight loss, work-up showed large 4cm  mass in the body of pancreas and multiple peritoneal nodule.  -I reviewed and discussed her PET scan results.  I reviewed the image in person and with patient and her family members.  Both the pancreatic mass,  peritoneal nodules are hypermetabolic, PET scan also showed significant metabolic activity in the sigmoid colon, suspicious for primary colon cancer -I recommend IR biopsy of the peritoneal nodule if it is feasible.  If the biopsy confirms metastatic pancreatic cancer, then she does not need EUS and pancreatic mass biopsy.  -However she does need colonoscopy to rule out simultaneous primary colon cancer.  -I will refer to IR for biopsy.  -she is scheduled with Dr. Ardis Hughs for endoscopy on 12/12  -Labs reviewed, CBC is WNLs. And CMP showed K 3.4 BG 204 and albumin 3.4. CA 19.9 pending.  -I advised her to increase calorie and protein intake. I will refer to a dietician.  -f/u after biopsy.   2. DM, HTN, and Hypothyroidism -f/u with Dr. Damita Dunnings  3. Genetics -if she is confirmed with pancreatic cancer, will refer her to genetics    Plan -I will refer to IR for peritoneal nodule biopsy to be  done as soon as possible  -she is scheduled for EUS and colonoscopy with Dr. Ardis Hughs on December 12.  If her peritoneal nodule biopsy confirmed pancreatic cancer, then will cancel EUS. -f/u after biopsy -I will refer to a dietician   No problem-specific Assessment & Plan notes found for this encounter.   Orders Placed This Encounter  Procedures  . CT Biopsy    Standing Status:   Future    Standing Expiration Date:   11/27/2018    Order Specific Question:   Lab orders requested (DO NOT place separate lab orders, these will be automatically ordered during procedure specimen collection):    Answer:   Surgical Pathology    Order Specific Question:   Reason for Exam (SYMPTOM  OR DIAGNOSIS REQUIRED)    Answer:   peritoneal nodule biopsy rule out/confirm metastasis    Order Specific Question:   Preferred imaging location?    Answer:   [facility]    Order Specific Question:   Radiology Contrast Protocol - do NOT remove file path    Answer:   [email or web address]   All questions were answered. The patient knows to call the clinic with any problems, questions or concerns. No barriers to learning was detected. I spent 20 minutes counseling the patient face to face. The total time spent in the appointment was 25 minutes and more than 50% was on counseling and review of test results  I, Noor Dweik am acting as scribe for Dr. Truitt Merle.  I have reviewed the above documentation for accuracy and completeness, and I agree with the above.      Truitt Merle, MD 11/27/2017

## 2017-11-27 ENCOUNTER — Encounter: Payer: Self-pay | Admitting: Hematology

## 2017-11-27 ENCOUNTER — Inpatient Hospital Stay: Payer: PPO

## 2017-11-27 ENCOUNTER — Inpatient Hospital Stay (HOSPITAL_BASED_OUTPATIENT_CLINIC_OR_DEPARTMENT_OTHER): Payer: PPO | Admitting: Hematology

## 2017-11-27 VITALS — BP 139/83 | HR 62 | Temp 97.9°F | Resp 18 | Ht 62.0 in | Wt 111.8 lb

## 2017-11-27 DIAGNOSIS — I700 Atherosclerosis of aorta: Secondary | ICD-10-CM

## 2017-11-27 DIAGNOSIS — R918 Other nonspecific abnormal finding of lung field: Secondary | ICD-10-CM

## 2017-11-27 DIAGNOSIS — K760 Fatty (change of) liver, not elsewhere classified: Secondary | ICD-10-CM

## 2017-11-27 DIAGNOSIS — E039 Hypothyroidism, unspecified: Secondary | ICD-10-CM

## 2017-11-27 DIAGNOSIS — R599 Enlarged lymph nodes, unspecified: Secondary | ICD-10-CM | POA: Diagnosis not present

## 2017-11-27 DIAGNOSIS — K219 Gastro-esophageal reflux disease without esophagitis: Secondary | ICD-10-CM

## 2017-11-27 DIAGNOSIS — I2510 Atherosclerotic heart disease of native coronary artery without angina pectoris: Secondary | ICD-10-CM

## 2017-11-27 DIAGNOSIS — Z8542 Personal history of malignant neoplasm of other parts of uterus: Secondary | ICD-10-CM

## 2017-11-27 DIAGNOSIS — M419 Scoliosis, unspecified: Secondary | ICD-10-CM

## 2017-11-27 DIAGNOSIS — K869 Disease of pancreas, unspecified: Secondary | ICD-10-CM

## 2017-11-27 DIAGNOSIS — E119 Type 2 diabetes mellitus without complications: Secondary | ICD-10-CM

## 2017-11-27 DIAGNOSIS — R634 Abnormal weight loss: Secondary | ICD-10-CM | POA: Diagnosis not present

## 2017-11-27 DIAGNOSIS — D490 Neoplasm of unspecified behavior of digestive system: Secondary | ICD-10-CM

## 2017-11-27 DIAGNOSIS — K439 Ventral hernia without obstruction or gangrene: Secondary | ICD-10-CM

## 2017-11-27 DIAGNOSIS — K8689 Other specified diseases of pancreas: Secondary | ICD-10-CM

## 2017-11-27 DIAGNOSIS — Z800 Family history of malignant neoplasm of digestive organs: Secondary | ICD-10-CM

## 2017-11-27 DIAGNOSIS — Z87442 Personal history of urinary calculi: Secondary | ICD-10-CM

## 2017-11-27 DIAGNOSIS — E785 Hyperlipidemia, unspecified: Secondary | ICD-10-CM

## 2017-11-27 DIAGNOSIS — I10 Essential (primary) hypertension: Secondary | ICD-10-CM

## 2017-11-27 DIAGNOSIS — Z79899 Other long term (current) drug therapy: Secondary | ICD-10-CM

## 2017-11-27 DIAGNOSIS — K429 Umbilical hernia without obstruction or gangrene: Secondary | ICD-10-CM

## 2017-11-27 LAB — CBC WITH DIFFERENTIAL (CANCER CENTER ONLY)
ABS IMMATURE GRANULOCYTES: 0.02 10*3/uL (ref 0.00–0.07)
BASOS PCT: 1 %
Basophils Absolute: 0 10*3/uL (ref 0.0–0.1)
Eosinophils Absolute: 0.1 10*3/uL (ref 0.0–0.5)
Eosinophils Relative: 1 %
HCT: 42 % (ref 36.0–46.0)
HEMOGLOBIN: 13.3 g/dL (ref 12.0–15.0)
IMMATURE GRANULOCYTES: 0 %
LYMPHS PCT: 31 %
Lymphs Abs: 2.1 10*3/uL (ref 0.7–4.0)
MCH: 30.6 pg (ref 26.0–34.0)
MCHC: 31.7 g/dL (ref 30.0–36.0)
MCV: 96.6 fL (ref 80.0–100.0)
MONOS PCT: 8 %
Monocytes Absolute: 0.5 10*3/uL (ref 0.1–1.0)
NEUTROS ABS: 4 10*3/uL (ref 1.7–7.7)
NEUTROS PCT: 59 %
Platelet Count: 288 10*3/uL (ref 150–400)
RBC: 4.35 MIL/uL (ref 3.87–5.11)
RDW: 13.3 % (ref 11.5–15.5)
WBC Count: 6.7 10*3/uL (ref 4.0–10.5)
nRBC: 0 % (ref 0.0–0.2)

## 2017-11-27 LAB — CMP (CANCER CENTER ONLY)
ALBUMIN: 3.4 g/dL — AB (ref 3.5–5.0)
ALT: 14 U/L (ref 0–44)
ANION GAP: 8 (ref 5–15)
AST: 16 U/L (ref 15–41)
Alkaline Phosphatase: 83 U/L (ref 38–126)
BILIRUBIN TOTAL: 0.6 mg/dL (ref 0.3–1.2)
BUN: 13 mg/dL (ref 8–23)
CHLORIDE: 103 mmol/L (ref 98–111)
CO2: 30 mmol/L (ref 22–32)
Calcium: 9.5 mg/dL (ref 8.9–10.3)
Creatinine: 0.76 mg/dL (ref 0.44–1.00)
GFR, Est AFR Am: >60 mL/min (ref >60)
GFR, Estimated: >60 mL/min (ref >60)
Glucose, Bld: 204 mg/dL — ABNORMAL HIGH (ref 70–99)
POTASSIUM: 3.4 mmol/L — AB (ref 3.5–5.1)
SODIUM: 141 mmol/L (ref 135–145)
Total Protein: 6.9 g/dL (ref 6.5–8.1)

## 2017-11-27 NOTE — Progress Notes (Signed)
  Oncology Nurse Navigator Documentation   Appointment with Dr. Ardis Hughs for endoscopy on 12/13/17 at [facility].    Arna Snipe, MS Ed.S, RN -[facility]  Gastrointestinal Oncology Nurse [place] at [place] [phone]

## 2017-11-28 ENCOUNTER — Encounter (HOSPITAL_COMMUNITY): Payer: PPO

## 2017-11-28 LAB — CANCER ANTIGEN 19-9: CAN 19-9: 682 U/mL — AB (ref 0–35)

## 2017-11-30 ENCOUNTER — Telehealth: Payer: Self-pay | Admitting: Hematology

## 2017-11-30 NOTE — Telephone Encounter (Signed)
Spoke with patient about upcoming appointments, patient stated they will call back if they need to reschedule it.

## 2017-12-01 ENCOUNTER — Other Ambulatory Visit: Payer: Self-pay

## 2017-12-01 ENCOUNTER — Inpatient Hospital Stay (HOSPITAL_COMMUNITY)
Admission: EM | Admit: 2017-12-01 | Discharge: 2017-12-04 | DRG: 392 | Disposition: A | Discharge status: Home / Self Care | Payer: PPO | Attending: Internal Medicine | Admitting: Internal Medicine

## 2017-12-01 ENCOUNTER — Encounter (HOSPITAL_COMMUNITY): Payer: Self-pay

## 2017-12-01 ENCOUNTER — Emergency Department (HOSPITAL_COMMUNITY): Payer: PPO

## 2017-12-01 DIAGNOSIS — Z800 Family history of malignant neoplasm of digestive organs: Secondary | ICD-10-CM

## 2017-12-01 DIAGNOSIS — H409 Unspecified glaucoma: Secondary | ICD-10-CM | POA: Diagnosis not present

## 2017-12-01 DIAGNOSIS — Z888 Allergy status to other drugs, medicaments and biological substances status: Secondary | ICD-10-CM

## 2017-12-01 DIAGNOSIS — E46 Unspecified protein-calorie malnutrition: Secondary | ICD-10-CM

## 2017-12-01 DIAGNOSIS — Z79890 Hormone replacement therapy (postmenopausal): Secondary | ICD-10-CM

## 2017-12-01 DIAGNOSIS — E785 Hyperlipidemia, unspecified: Secondary | ICD-10-CM | POA: Diagnosis not present

## 2017-12-01 DIAGNOSIS — Z9841 Cataract extraction status, right eye: Secondary | ICD-10-CM

## 2017-12-01 DIAGNOSIS — Z8049 Family history of malignant neoplasm of other genital organs: Secondary | ICD-10-CM

## 2017-12-01 DIAGNOSIS — C259 Malignant neoplasm of pancreas, unspecified: Secondary | ICD-10-CM | POA: Diagnosis not present

## 2017-12-01 DIAGNOSIS — K5289 Other specified noninfective gastroenteritis and colitis: Principal | ICD-10-CM | POA: Diagnosis present

## 2017-12-01 DIAGNOSIS — Z91040 Latex allergy status: Secondary | ICD-10-CM

## 2017-12-01 DIAGNOSIS — Z90710 Acquired absence of both cervix and uterus: Secondary | ICD-10-CM | POA: Diagnosis not present

## 2017-12-01 DIAGNOSIS — C786 Secondary malignant neoplasm of retroperitoneum and peritoneum: Secondary | ICD-10-CM | POA: Diagnosis present

## 2017-12-01 DIAGNOSIS — Z882 Allergy status to sulfonamides status: Secondary | ICD-10-CM

## 2017-12-01 DIAGNOSIS — K598 Other specified functional intestinal disorders: Secondary | ICD-10-CM | POA: Diagnosis not present

## 2017-12-01 DIAGNOSIS — Z681 Body mass index (BMI) 19 or less, adult: Secondary | ICD-10-CM | POA: Diagnosis not present

## 2017-12-01 DIAGNOSIS — Z87442 Personal history of urinary calculi: Secondary | ICD-10-CM | POA: Diagnosis not present

## 2017-12-01 DIAGNOSIS — I10 Essential (primary) hypertension: Secondary | ICD-10-CM

## 2017-12-01 DIAGNOSIS — Z8542 Personal history of malignant neoplasm of other parts of uterus: Secondary | ICD-10-CM | POA: Diagnosis not present

## 2017-12-01 DIAGNOSIS — K644 Residual hemorrhoidal skin tags: Secondary | ICD-10-CM | POA: Diagnosis present

## 2017-12-01 DIAGNOSIS — Z8249 Family history of ischemic heart disease and other diseases of the circulatory system: Secondary | ICD-10-CM

## 2017-12-01 DIAGNOSIS — Z9842 Cataract extraction status, left eye: Secondary | ICD-10-CM | POA: Diagnosis not present

## 2017-12-01 DIAGNOSIS — E119 Type 2 diabetes mellitus without complications: Secondary | ICD-10-CM

## 2017-12-01 DIAGNOSIS — K5900 Constipation, unspecified: Secondary | ICD-10-CM | POA: Diagnosis not present

## 2017-12-01 DIAGNOSIS — E039 Hypothyroidism, unspecified: Secondary | ICD-10-CM | POA: Diagnosis present

## 2017-12-01 DIAGNOSIS — K529 Noninfective gastroenteritis and colitis, unspecified: Secondary | ICD-10-CM

## 2017-12-01 DIAGNOSIS — Z885 Allergy status to narcotic agent status: Secondary | ICD-10-CM | POA: Diagnosis not present

## 2017-12-01 DIAGNOSIS — K5730 Diverticulosis of large intestine without perforation or abscess without bleeding: Secondary | ICD-10-CM | POA: Diagnosis not present

## 2017-12-01 DIAGNOSIS — Z9049 Acquired absence of other specified parts of digestive tract: Secondary | ICD-10-CM

## 2017-12-01 DIAGNOSIS — Z881 Allergy status to other antibiotic agents status: Secondary | ICD-10-CM

## 2017-12-01 DIAGNOSIS — Z79899 Other long term (current) drug therapy: Secondary | ICD-10-CM

## 2017-12-01 DIAGNOSIS — R109 Unspecified abdominal pain: Secondary | ICD-10-CM | POA: Diagnosis present

## 2017-12-01 DIAGNOSIS — K219 Gastro-esophageal reflux disease without esophagitis: Secondary | ICD-10-CM | POA: Diagnosis present

## 2017-12-01 DIAGNOSIS — R1013 Epigastric pain: Secondary | ICD-10-CM | POA: Diagnosis not present

## 2017-12-01 HISTORY — DX: Malignant neoplasm of pancreas, unspecified: C25.9

## 2017-12-01 LAB — COMPREHENSIVE METABOLIC PANEL
ALK PHOS: 76 U/L (ref 38–126)
ALT: 20 U/L (ref 0–44)
AST: 26 U/L (ref 15–41)
Albumin: 3.4 g/dL — ABNORMAL LOW (ref 3.5–5.0)
Anion gap: 11 (ref 5–15)
BILIRUBIN TOTAL: 0.8 mg/dL (ref 0.3–1.2)
BUN: 13 mg/dL (ref 8–23)
CO2: 26 mmol/L (ref 22–32)
CREATININE: 0.67 mg/dL (ref 0.44–1.00)
Calcium: 9.2 mg/dL (ref 8.9–10.3)
Chloride: 100 mmol/L (ref 98–111)
GFR calc Af Amer: >60 mL/min (ref >60)
Glucose, Bld: 153 mg/dL — ABNORMAL HIGH (ref 70–99)
Potassium: 3.5 mmol/L (ref 3.5–5.1)
Sodium: 137 mmol/L (ref 135–145)
TOTAL PROTEIN: 6.7 g/dL (ref 6.5–8.1)

## 2017-12-01 LAB — CBC WITH DIFFERENTIAL/PLATELET
ABS IMMATURE GRANULOCYTES: 0.02 10*3/uL (ref 0.00–0.07)
Basophils Absolute: 0 10*3/uL (ref 0.0–0.1)
Basophils Relative: 0 %
Eosinophils Absolute: 0 10*3/uL (ref 0.0–0.5)
Eosinophils Relative: 0 %
HEMATOCRIT: 44.6 % (ref 36.0–46.0)
Hemoglobin: 14 g/dL (ref 12.0–15.0)
IMMATURE GRANULOCYTES: 0 %
LYMPHS ABS: 2 10*3/uL (ref 0.7–4.0)
LYMPHS PCT: 21 %
MCH: 30.4 pg (ref 26.0–34.0)
MCHC: 31.4 g/dL (ref 30.0–36.0)
MCV: 97 fL (ref 80.0–100.0)
Monocytes Absolute: 0.8 10*3/uL (ref 0.1–1.0)
Monocytes Relative: 8 %
Neutro Abs: 6.9 10*3/uL (ref 1.7–7.7)
Neutrophils Relative %: 71 %
Platelets: 286 10*3/uL (ref 150–400)
RBC: 4.6 MIL/uL (ref 3.87–5.11)
RDW: 13.3 % (ref 11.5–15.5)
WBC: 9.7 10*3/uL (ref 4.0–10.5)
nRBC: 0 % (ref 0.0–0.2)

## 2017-12-01 LAB — LIPASE, BLOOD: LIPASE: 24 U/L (ref 11–51)

## 2017-12-01 MED ORDER — LEVOTHYROXINE SODIUM 88 MCG PO TABS
88.0000 ug | ORAL_TABLET | ORAL | Status: DC
  Administered 2017-12-02 – 2017-12-04 (×3): 88 ug via ORAL
  Filled 2017-12-01 (×3): qty 1

## 2017-12-01 MED ORDER — ADULT MULTIVITAMIN W/MINERALS CH
1.0000 | ORAL_TABLET | Freq: Every day | ORAL | Status: DC
  Administered 2017-12-01 – 2017-12-04 (×4): 1 via ORAL
  Filled 2017-12-01 (×4): qty 1

## 2017-12-01 MED ORDER — VITAMIN D 25 MCG (1000 UNIT) PO TABS
2000.0000 [IU] | ORAL_TABLET | Freq: Every day | ORAL | Status: DC
  Administered 2017-12-01 – 2017-12-04 (×4): 2000 [IU] via ORAL
  Filled 2017-12-01 (×5): qty 2

## 2017-12-01 MED ORDER — IOHEXOL 300 MG/ML  SOLN
100.0000 mL | Freq: Once | INTRAMUSCULAR | Status: AC | PRN
  Administered 2017-12-01: 100 mL via INTRAVENOUS

## 2017-12-01 MED ORDER — FENTANYL CITRATE (PF) 100 MCG/2ML IJ SOLN
50.0000 ug | Freq: Once | INTRAMUSCULAR | Status: AC
  Administered 2017-12-01: 50 ug via INTRAVENOUS
  Filled 2017-12-01: qty 2

## 2017-12-01 MED ORDER — BRIMONIDINE TARTRATE 0.15 % OP SOLN
1.0000 [drp] | Freq: Two times a day (BID) | OPHTHALMIC | Status: DC
  Administered 2017-12-02 – 2017-12-04 (×5): 1 [drp] via OPHTHALMIC
  Filled 2017-12-01 (×2): qty 5

## 2017-12-01 MED ORDER — POLYETHYLENE GLYCOL 3350 17 G PO PACK
17.0000 g | PACK | Freq: Every day | ORAL | Status: DC
  Administered 2017-12-01 – 2017-12-04 (×4): 17 g via ORAL
  Filled 2017-12-01 (×4): qty 1

## 2017-12-01 MED ORDER — ATENOLOL 25 MG PO TABS
25.0000 mg | ORAL_TABLET | Freq: Every day | ORAL | Status: DC
  Administered 2017-12-01 – 2017-12-04 (×4): 25 mg via ORAL
  Filled 2017-12-01 (×4): qty 1

## 2017-12-01 MED ORDER — SODIUM CHLORIDE 0.9 % IV BOLUS
1000.0000 mL | Freq: Once | INTRAVENOUS | Status: AC
  Administered 2017-12-01: 1000 mL via INTRAVENOUS

## 2017-12-01 MED ORDER — SODIUM CHLORIDE 0.9 % IV SOLN
INTRAVENOUS | Status: AC
  Administered 2017-12-01 – 2017-12-02 (×2): via INTRAVENOUS

## 2017-12-01 MED ORDER — LATANOPROST 0.005 % OP SOLN
1.0000 [drp] | Freq: Every day | OPHTHALMIC | Status: DC
  Administered 2017-12-03: 1 [drp] via OPHTHALMIC
  Filled 2017-12-01 (×2): qty 2.5

## 2017-12-01 MED ORDER — ENOXAPARIN SODIUM 40 MG/0.4ML ~~LOC~~ SOLN
40.0000 mg | SUBCUTANEOUS | Status: DC
  Administered 2017-12-02 – 2017-12-03 (×2): 40 mg via SUBCUTANEOUS
  Filled 2017-12-01 (×3): qty 0.4

## 2017-12-01 MED ORDER — PSYLLIUM 95 % PO PACK
1.0000 | PACK | Freq: Every day | ORAL | Status: DC
  Administered 2017-12-01 – 2017-12-04 (×3): 1 via ORAL
  Filled 2017-12-01 (×4): qty 1

## 2017-12-01 MED ORDER — ACETAMINOPHEN 500 MG PO TABS
1000.0000 mg | ORAL_TABLET | Freq: Four times a day (QID) | ORAL | Status: DC | PRN
  Administered 2017-12-02: 1000 mg via ORAL
  Filled 2017-12-01: qty 2

## 2017-12-01 MED ORDER — SODIUM CHLORIDE 0.9 % IV SOLN: Freq: Once | INTRAVENOUS | Status: DC

## 2017-12-01 MED ORDER — DORZOLAMIDE HCL-TIMOLOL MAL 2-0.5 % OP SOLN
1.0000 [drp] | Freq: Two times a day (BID) | OPHTHALMIC | Status: DC
  Administered 2017-12-02 – 2017-12-04 (×5): 1 [drp] via OPHTHALMIC
  Filled 2017-12-01 (×2): qty 10

## 2017-12-01 MED ORDER — BIOTIN 10 MG PO CAPS: 1.0000 | ORAL_CAPSULE | Freq: Every day | ORAL | Status: DC

## 2017-12-01 NOTE — H&P (Signed)
History and Physical    Molly [name] XIP:[id number] DOB: 05/30/1929 DOA: 12/01/2017  PCP: Tonia Ghent, MD Patient coming from: Home  Chief Complaint: Constipation  HPI: Molly [name] is a 84 y.o. female with medical history significant of recently diagnosed probable pancreatic cancer with peritoneal nodules and sigmoid colon mass, type 2 diabetes, hypertension, hypothyroidism presenting to the hospital for evaluation of constipation.  Patient reports being constipated for the past 2 days and passing very little stool.  She did not have a bowel movement today.  Reports having decreased p.o. intake and just drinking broth the last few days.  Denies having any nausea or vomiting.  Reports having generalized abdominal pain, worse in the left lower quadrant.  Denies having any fevers.  ED Course: Afebrile and hemodynamically stable.  No leukocytosis.  Lipase normal.  LFTs normal.  CT abdomen pelvis showing no change in irregular annular distal sigmoid colon wall thickening, cannot exclude primary sigmoid neoplasm. No evidence of small-bowel obstruction. Moderate stool throughout the left colon above the site of sigmoid colon wall thickening, with new wall thickening and pericolonic fat stranding in the descending colon. Findings suggest a nonspecific descending colitis, potentially stercoral colitis due to fecal stasis. No free air or Abscess. Solid omental nodules are increased in size and number since 11/13/2017 CT, most compatible with progressive peritoneal carcinomatosis. Poorly marginated hypoenhancing pancreatic body mass compatible with primary pancreatic adenocarcinoma, not appreciably changed in size. Worsening narrowing of the portosplenic venous confluence by the pancreatic mass. Patient received 1 L normal saline bolus in the ED.  Review of Systems: As per HPI otherwise 10 point review of systems negative.  Past Medical History:  Diagnosis Date  . Cataract    removed  both eyes  . Diabetes mellitus without complication ([place]) 06/08/3417   diet controlled   . Diverticulosis of colon   . GERD (gastroesophageal reflux disease)   . Glaucoma   . Hyperlipidemia   . Hypertension   . Kidney stones   . MALIGNANT NEOPLASM OF UTERUS PART UNSPECIFIED 1998  . Pancreatic cancer (Molly [name])   . Thyroid disease    hypothyroidism    Past Surgical History:  Procedure Laterality Date  . ABDOMINAL HYSTERECTOMY  12/22/1996   Uterine Cancer Molly [name]  . BREAST BIOPSY  1996   Benign  . BREAST EXCISIONAL BIOPSY Left   . CHOLECYSTECTOMY    . COLONOSCOPY    . Detached Retina Repair OS    . FACIAL LACERATION REPAIR Left 01/20/2015   Procedure: FACIAL LACERATION REPAIR;  Surgeon: Molly Goltz, MD;  Location: [facility];  Service: Ophthalmology;  Laterality: Left;  . Release R Long Finger A1 Pulley  01/23/2008   Molly Daylene Katayama  . RUPTURED GLOBE EXPLORATION AND REPAIR Left 01/20/2015   Procedure: REPAIR OF RUPTURED GLOBE and FACIAL LACERATIONS;  Surgeon: Molly Goltz, MD;  Location: [facility];  Service: Ophthalmology;  Laterality: Left;  . UPPER GASTROINTESTINAL ENDOSCOPY       reports that she has never smoked. She has never used smokeless tobacco. She reports that she does not drink alcohol or use drugs.  Allergies  Allergen Reactions  . Erythromycin     Intolerant  . Ezetimibe-Simvastatin     REACTION: MUSCLE ACHES AND PAINS.   Marland Kitchen Latex Swelling    "thrush" type rash and swelling  . Macrodantin [Nitrofurantoin Macrocrystal] Nausea Only  . Metformin And Related Other (See Comments)    Abdominal pain with short and long acting metformin.    Marland Kitchen  Morphine     Chest pain  . Other Other (See Comments)    novacaine-sick  . Petroleum Jelly [Skin Protectants, Misc.] Other (See Comments)    Skin irritation.   Marland Kitchen Preparation H [Phenyleph-Shark Liv Oil-Mo-Pet] Other (See Comments)    Local irritation, burning.   . Procaine Hcl     Can tolerate lidocaine  . Simvastatin       myalgias  . Sulfamethoxazole Other (See Comments)    Does not agree with her  . White Petrolatum Other (See Comments)    Skin irritation.   . Adhesive [Tape] Rash  . Tape Rash    Rash Rash    Family History  Problem Relation Age of Onset  . Heart disease Father 12       MI  . Cancer Sister 74       Cervical Cancer  . Cancer Mother 53       gastric cancer- pt states stomach or  liver cancer - unsure which   . Colon cancer Maternal Aunt   . Cancer Maternal Grandmother        gastric cancer   . Diabetes Neg Hx   . Breast cancer Neg Hx   . Colon polyps Neg Hx   . Esophageal cancer Neg Hx   . Rectal cancer Neg Hx     Prior to Admission medications   Medication Sig Start Date End Date Taking? Authorizing Provider  acetaminophen (TYLENOL) 500 MG tablet Take 1 tablet (500 mg total) by mouth every 6 (six) hours as needed for mild pain or headache. 01/22/15  Yes Dhungel, Nishant, MD  atenolol (TENORMIN) 25 MG tablet Take 1 tablet (47m) by mouth daily 10/23/17  Yes DTonia Ghent MD  Biotin 10 MG CAPS Take 1 capsule by mouth daily.    Yes [provider]  Black Cohosh-SoyIsoflav-Magnol (ESTROVEN MENOPAUSE RELIEF) CAPS Take by mouth.   Yes [provider]  brimonidine (ALPHAGAN P) 0.1 % SOLN Apply 1 drop to eye 2 (two) times daily.   Yes [provider]  Cholecalciferol ([employer] VITAMIN D) 2000 UNITS CAPS Take 1 capsule (2,000 Units total) by mouth daily. 11/23/14  Yes DTonia Ghent MD  Coenzyme Q10 (COQ10 PO) Take 1 tablet by mouth daily.    Yes [provider]  dorzolamide-timolol (COSOPT) 22.3-6.8 MG/ML ophthalmic solution Place 1 drop into the right eye 2 (two) times daily.    Yes [provider]  latanoprost (XALATAN) 0.005 % ophthalmic solution Place 1 drop into the right eye at bedtime.    Yes [provider]  levothyroxine (SYNTHROID, LEVOTHROID) 88 MCG tablet Take 1 tablet by mouth daily 08/03/17  Yes DTonia Ghent MD   lidocaine (XYLOCAINE) 5 % ointment APPLY A SMALL AMOUNT TO RECTUM 3-4 TIMES PER DAY AS NEEDED 10/10/17  Yes DTonia Ghent MD  Multiple Vitamin (MULTIVITAMIN) tablet Take 1 tablet by mouth daily.     Yes [provider]  Blood Glucose Monitoring Suppl (OSallis w/Device KIT Use to check blood sugar once daily and as needed.  Non insulin dependent  E11.9 07/16/17   DTonia Ghent MD  glucose blood ([facility]) test strip Use as instructed to check blood sugar once daily and as needed.  Non insulin dependent.  E11.9 09/17/17   DTonia Ghent MD  [facility] 360YMISC Use to check blood sugar once daily and as needed.  Non insulin dependent.  E11.9 09/17/17  Tonia Ghent, MD    Physical Exam: Vitals:   12/01/17 1900 12/01/17 1930 12/01/17 2022 12/01/17 2156  BP:   121/81 (!) 152/79  Pulse:  70 68 67  Resp: 16  16   Temp:    98.5 F (36.9 C)  TempSrc:    Oral  SpO2:  95% 98% 97%  Weight:    48.1 kg  Height:    _0  (1.575 m)    Physical Exam  Constitutional: She is oriented to person, place, and time. She appears well-developed and well-nourished. No distress.  HENT:  Head: Normocephalic.  Dry mucous membranes  Eyes: Right eye exhibits no discharge. Left eye exhibits no discharge.  Neck: Neck supple.  Cardiovascular: Normal rate, regular rhythm and intact distal pulses.  Pulmonary/Chest: Effort normal and breath sounds normal. No respiratory distress. She has no wheezes. She has no rales.  Abdominal: Soft. Bowel sounds are normal. She exhibits no distension. There is tenderness. There is guarding. There is no rebound.  Left upper and lower quadrants tender to palpation  Musculoskeletal: She exhibits no edema.  Neurological: She is alert and oriented to person, place, and time.  Skin: Skin is warm and dry. She is not diaphoretic.  Psychiatric: She has a normal mood and affect. Her behavior is normal.     Labs on Admission: I  have personally reviewed following labs and imaging studies  CBC: Recent Labs  Lab 11/27/17 1050 12/01/17 1557  WBC 6.7 9.7  NEUTROABS 4.0 6.9  HGB 13.3 14.0  HCT 42.0 44.6  MCV 96.6 97.0  PLT 288 517   Basic Metabolic Panel: Recent Labs  Lab 11/27/17 1050 12/01/17 1557  NA 141 137  K 3.4* 3.5  CL 103 100  CO2 30 26  GLUCOSE 204* 153*  BUN 13 13  CREATININE 0.76 0.67  CALCIUM 9.5 9.2   GFR: Estimated Creatinine Clearance: 38.3 mL/min (by C-G formula based on SCr of 0.67 mg/dL). Liver Function Tests: Recent Labs  Lab 11/27/17 1050 12/01/17 1557  AST 16 26  ALT 14 20  ALKPHOS 83 76  BILITOT 0.6 0.8  PROT 6.9 6.7  ALBUMIN 3.4* 3.4*   Recent Labs  Lab 12/01/17 1557  LIPASE 24   No results for input(s): AMMONIA in the last 168 hours. Coagulation Profile: No results for input(s): INR, PROTIME in the last 168 hours. Cardiac Enzymes: No results for input(s): CKTOTAL, CKMB, CKMBINDEX, TROPONINI in the last 168 hours. BNP (last 3 results) No results for input(s): PROBNP in the last 8760 hours. HbA1C: No results for input(s): HGBA1C in the last 72 hours. CBG: Recent Labs  Lab 11/26/17 1227  GLUCAP 133*   Lipid Profile: No results for input(s): CHOL, HDL, LDLCALC, TRIG, CHOLHDL, LDLDIRECT in the last 72 hours. Thyroid Function Tests: No results for input(s): TSH, T4TOTAL, FREET4, T3FREE, THYROIDAB in the last 72 hours. Anemia Panel: No results for input(s): VITAMINB12, FOLATE, FERRITIN, TIBC, IRON, RETICCTPCT in the last 72 hours. Urine analysis:    Component Value Date/Time   COLORURINE YELLOW 01/22/2015 2238   APPEARANCEUR CLOUDY (A) 01/22/2015 2238   LABSPEC 1.012 01/22/2015 2238   PHURINE 6.5 01/22/2015 2238   GLUCOSEU NEGATIVE 01/22/2015 2238   HGBUR NEGATIVE 01/22/2015 2238   HGBUR negative 06/23/2008 0818   BILIRUBINUR Neg 09/08/2016 [place] 01/22/2015 2238   PROTEINUR Neg 09/08/2016 1249   PROTEINUR NEGATIVE 01/22/2015  2238   UROBILINOGEN 0.2 09/08/2016 1249   UROBILINOGEN 0.2 03/11/2014 1134  NITRITE Neg 09/08/2016 1249   NITRITE NEGATIVE 01/22/2015 2238   LEUKOCYTESUR Small (1+) (A) 09/08/2016 1249    Radiological Exams on Admission: Ct Abdomen Pelvis W Contrast  Result Date: 12/01/2017 CLINICAL DATA:  Abdominal pain. Nausea. Vomiting. Constipation. History of pancreatic cancer and hypermetabolic focal sigmoid colon wall thickening. EXAM: CT ABDOMEN AND PELVIS WITH CONTRAST TECHNIQUE: Multidetector CT imaging of the abdomen and pelvis was performed using the standard protocol following bolus administration of intravenous contrast. CONTRAST:  156m OMNIPAQUE IOHEXOL 300 MG/ML  SOLN COMPARISON:  11/26/2017 PET-CT.  11/13/2017 CT abdomen/pelvis. FINDINGS: Lower chest: Bandlike scarring versus atelectasis in the lingula. Coronary atherosclerosis. Hepatobiliary: Normal liver size. Scattered granulomatous liver calcifications are stable. No liver masses. Cholecystectomy. Bile ducts are not convincingly changed and are within expected post cholecystectomy limits with CBD diameter 9 mm with smooth distal tapering. Pancreas: Poorly marginated hypoenhancing 4.8 x 3.1 cm pancreatic body mass, not appreciably changed from 11/13/2017 CT using similar measurement technique. Stable atrophy and duct dilation in the pancreatic tail. Narrowing of the portosplenic venous confluence by the mass appears slightly worsened. Spleen: Normal size spleen. Scattered subcentimeter hypodense splenic lesions are stable. No new splenic lesion. Adrenals/Urinary Tract: Stable adrenal glands without discrete adrenal nodules. Stable mild fullness of the right renal collecting system without overt hydronephrosis. No left hydronephrosis. Symmetric normal contrast nephrograms. Small simple left renal cysts, largest 1.8 cm in the lower left kidney. Additional subcentimeter hypodense renal cortical lesions in the left kidney are too small to characterize  and are unchanged. No new renal lesions. Normal mildly distended bladder. Stomach/Bowel: Small hiatal hernia. Otherwise normal nondistended stomach. Normal caliber small bowel with no small bowel wall thickening. Normal appendix. Oral contrast transits to the splenic flexure of the colon. Irregular annular wall thickening in the distal sigmoid colon is not convincingly changed. There is moderate stool and mild colonic dilatation in the left colon proximal to the sigmoid wall thickening. Moderate sigmoid diverticulosis. There is new circumferential wall thickening and pericolonic fat stranding in the descending colon. Vascular/Lymphatic: Atherosclerotic nonaneurysmal abdominal aorta. Patent hepatic and renal veins. No pathologically enlarged lymph nodes in the abdomen or pelvis. Reproductive: Status post hysterectomy, with no abnormal findings at the vaginal cuff. No adnexal mass. Other: No pneumoperitoneum, ascites or focal fluid collection. Stable small fat containing periumbilical hernia. Clustered mid omental nodularity extending into the periumbilical hernia, with dominant 1.1 cm solid omental nodule, increased from 0.6 cm on 11/13/2017 CT. New subcentimeter solid left omental nodules (series 3/image 43). Musculoskeletal: No aggressive appearing focal osseous lesions. Moderate thoracolumbar spondylosis. IMPRESSION: 1. No change in irregular annular distal sigmoid colon wall thickening, cannot exclude primary sigmoid neoplasm. 2. No evidence of small-bowel obstruction. Moderate stool throughout the left colon above the site of sigmoid colon wall thickening, with new wall thickening and pericolonic fat stranding in the descending colon. Findings suggest a nonspecific descending colitis, potentially stercoral colitis due to fecal stasis. No free air or abscess. 3. Solid omental nodules are increased in size and number since 11/13/2017 CT, most compatible with progressive peritoneal carcinomatosis. 4. Poorly  marginated hypoenhancing pancreatic body mass compatible with primary pancreatic adenocarcinoma, not appreciably changed in size. Worsening narrowing of the portosplenic venous confluence by the pancreatic mass. 5.  Aortic Atherosclerosis (ICD10-I70.0). Electronically Signed   By: JIlona SorrelM.D.   On: 12/01/2017 19:04    Assessment/Plan Principal Problem:   Constipation Active Problems:   Hypothyroidism   HTN (hypertension)   Abdominal pain   Protein calorie  malnutrition ([place])   Type 2 diabetes mellitus ([facility])  Constipation and abdominal pain, suspected stercoral colitis due to fecal stasis Afebrile and no leukocytosis.  Lipase normal.  LFTs normal. CT abdomen pelvis showing no change in irregular annular distal sigmoid colon wall thickening, cannot exclude primary sigmoid neoplasm. No evidence of small-bowel obstruction. Moderate stool throughout the left colon above the site of sigmoid colon wall thickening, with new wall thickening and pericolonic fat stranding in the descending colon. Findings suggest a nonspecific descending colitis, potentially stercoral colitis due to fecal stasis. No free air or Abscess.  -IV fluid hydration -Psyllium fiber supplement  -Miralax 17 g daily -Clear liquid diet -Tylenol PRN abdominal pain.  Avoid opiates. -If patient does not have a bowel movement with the measures listed above, consider warm water enema. -Check TSH level  Recently diagnosed probable pancreatic cancer with peritoneal nodules and sigmoid colon mass -CT abdomen pelvis showing no change in irregular annular distal sigmoid colon wall thickening, cannot exclude primary sigmoid neoplasm. Solid omental nodules are increased in size and number since 11/13/2017 CT, most compatible with progressive peritoneal carcinomatosis. Poorly marginated hypoenhancing pancreatic body mass compatible with primary pancreatic adenocarcinoma, not appreciably changed in size. Worsening narrowing of the  portosplenic venous confluence by the pancreatic mass. -Patient was seen by Molly. Burr Medico on November 27, 2017.  Per note, pancreatic mass and peritoneal nodules were hypermetabolic on PET scan showed significant metabolic activity in the sigmoid colon, suspicious for primary colon cancer.  Molly. Burr Medico referred the patient to IR for biopsy of the peritoneal nodule.  She is scheduled with Molly. Ardis Hughs for EUS and colonoscopy on 12/12.   Protein calorie malnutrition Albumin 3.4. -Nutrition consult  Type 2 diabetes Blood glucose 153 on admission.  Hypertension Blood pressure initially elevated, now normotensive. -Continue home atenolol  Hypothyroidism -Continue home Synthroid -Patient is constipated.  Check TSH level.  Type 2 diabetes Patient is not on home medications.  Blood glucose 153 on admission. -Check A1c -Hold insulin at this time in the setting of decreased p.o. Intake -CBG checks  DVT prophylaxis: Lovenox Code Status: Patient wishes to be full code. Family Communication: Husband at bedside Disposition Plan: Anticipate discharge to home in 1 to 2 days. Consults called: None Admission status: Observation   Shela Leff MD [facility] Pager [phone]  If 7PM-7AM, please contact night-coverage [email or web address] Password [email or web address]  12/01/2017, 9:59 PM

## 2017-12-01 NOTE — ED Provider Notes (Signed)
[name] Provider Note   CSN: [id number] Arrival date & time: 12/01/17  1433     History   Chief Complaint Chief Complaint  Patient presents with  . Constipation  . Abdominal Cramping    HPI Molly [name] is a 84 y.o. female sent in for evaluation of abdominal pain and constipation.  Patient states she has been unable to have a normal or good bowel movement for the past 2 to 3 days.  She has been eating very little for the past 2 to 3 days, due to pain and nausea.  She tried glycerin suppositories and stool softeners without improvement of symptoms.  She has not taken anything for pain including Tylenol or ibuprofen.  She is not on narcotic pain medication.  She denies problems with constipation before.  She denies fevers, chills, chest pain, shortness of breath, or urinary symptoms.  Patient does have a history of pancreatic cancer, which is in its early stages of being identified.  She has a biopsy planned for December 5, and a EGD and colonoscopy planned for the 12th.  Patient states she feels very dehydrated and weak.  She reports history of hysterectomy and cholecystectomy.  HPI  Past Medical History:  Diagnosis Date  . Cataract    removed both eyes  . Diabetes mellitus without complication ([place]) 08/09/4164   diet controlled   . Diverticulosis of colon   . GERD (gastroesophageal reflux disease)   . Glaucoma   . Hyperlipidemia   . Hypertension   . Kidney stones   . MALIGNANT NEOPLASM OF UTERUS PART UNSPECIFIED 1998  . Pancreatic cancer ([place])   . Thyroid disease    hypothyroidism    Patient Active Problem List   Diagnosis Date Noted  . Constipation 12/01/2017  . Protein calorie malnutrition ([place]) 12/01/2017  . Type 2 diabetes mellitus ([place]) 12/01/2017  . Pancreatic mass 11/15/2017  . Abnormal weight loss 11/11/2017  . 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 Diabetes mellitus without complication ([place]) 07/02/1599  . Health care maintenance  01/08/2017  . Dysuria 09/08/2016  . Easy bruising 08/04/2016  . Rectal pain 01/29/2015  . Fatigue 01/23/2015  . Dehydration 01/23/2015  . Post concussion syndrome 01/23/2015  . Numbness and tingling of left side of face   . Ruptured globe 01/21/2015  . Hypokalemia 01/21/2015  . Osteopenia 01/01/2015  . Vitamin D deficiency 11/25/2014  . Cough 10/01/2014  . Chronic RLQ pain 03/12/2014  . Advance care planning 11/24/2013  . Cystocele 11/24/2013  . Medicare annual wellness visit, subsequent 1Nov 02, 202014  . Glaucoma 1Nov 02, 202014  . Abdominal pain 10/25/2011  . Diverticulosis 08/18/2011  . Postmenopausal HRT (hormone replacement therapy) 07/18/2011  . Back pain 07/18/2011  . Shoulder pain 07/18/2011  . GERD 09/14/2008  . TIBIALIS TENDINITIS 07/30/2008  . PAIN IN JOINT, MULTIPLE SITES 06/02/2008  . INTERMITTENT CLAUDICATION, LEFT LEG 11/19/2007  . Hypothyroidism 05/28/2007  . HYPERLIPIDEMIA, MIXED 05/28/2007  . HTN (hypertension) 05/28/2007  . Disorder of bone and cartilage 05/28/2007  . IMPAIRED FASTING GLUCOSE 05/28/2007    Past Surgical History:  Procedure Laterality Date  . ABDOMINAL HYSTERECTOMY  12/22/1996   Uterine Cancer Dr Phineas Real  . BREAST BIOPSY  1996   Benign  . BREAST EXCISIONAL BIOPSY Left   . CHOLECYSTECTOMY    . COLONOSCOPY    . Detached Retina Repair OS    . FACIAL LACERATION REPAIR Left 01/20/2015   Procedure: FACIAL LACERATION REPAIR;  Surgeon: Danice Goltz, MD;  Location:  [place] [facility];  Service: Ophthalmology;  Laterality: Left;  . Release R Long Finger A1 Pulley  01/23/2008   Dr Daylene Katayama  . RUPTURED GLOBE EXPLORATION AND REPAIR Left 01/20/2015   Procedure: REPAIR OF RUPTURED GLOBE and FACIAL LACERATIONS;  Surgeon: Danice Goltz, MD;  Location: [facility];  Service: Ophthalmology;  Laterality: Left;  . UPPER GASTROINTESTINAL ENDOSCOPY       OB History    Gravida  0   Para  0   Term  0   Preterm  0   AB  0   Living        SAB  0   TAB    0   Ectopic  0   Multiple      Live Births               Home Medications    Prior to Admission medications   Medication Sig Start Date End Date Taking? Authorizing Provider  acetaminophen (TYLENOL) 500 MG tablet Take 1 tablet (500 mg total) by mouth every 6 (six) hours as needed for mild pain or headache. 01/22/15  Yes Dhungel, Nishant, MD  atenolol (TENORMIN) 25 MG tablet Take 1 tablet ('25mg'$ ) by mouth daily 10/23/17  Yes Tonia Ghent, MD  Biotin 10 MG CAPS Take 1 capsule by mouth daily.    Yes [provider]  Black Cohosh-SoyIsoflav-Magnol (ESTROVEN MENOPAUSE RELIEF) CAPS Take by mouth.   Yes [provider]  brimonidine (ALPHAGAN P) 0.1 % SOLN Apply 1 drop to eye 2 (two) times daily.   Yes [provider]  Cholecalciferol ([employer] VITAMIN D) 2000 UNITS CAPS Take 1 capsule (2,000 Units total) by mouth daily. 11/23/14  Yes Tonia Ghent, MD  Coenzyme Q10 (COQ10 PO) Take 1 tablet by mouth daily.    Yes [provider]  dorzolamide-timolol (COSOPT) 22.3-6.8 MG/ML ophthalmic solution Place 1 drop into the right eye 2 (two) times daily.    Yes [provider]  latanoprost (XALATAN) 0.005 % ophthalmic solution Place 1 drop into the right eye at bedtime.    Yes [provider]  levothyroxine (SYNTHROID, LEVOTHROID) 88 MCG tablet Take 1 tablet by mouth daily 08/03/17  Yes Tonia Ghent, MD  lidocaine (XYLOCAINE) 5 % ointment APPLY A SMALL AMOUNT TO RECTUM 3-4 TIMES PER DAY AS NEEDED 10/10/17  Yes Tonia Ghent, MD  Multiple Vitamin (MULTIVITAMIN) tablet Take 1 tablet by mouth daily.     Yes [provider]  Blood Glucose Monitoring Suppl ([place]) w/Device KIT Use to check blood sugar once daily and as needed.  Non insulin dependent  E11.9 07/16/17   Tonia Ghent, MD  glucose blood [facility] VERIO) test strip Use as instructed to check blood sugar once daily and as needed.  Non insulin dependent.   E11.9 09/17/17   Tonia Ghent, MD  [facility] DELICA LANCETS 93G MISC Use to check blood sugar once daily and as needed.  Non insulin dependent.  E11.9 09/17/17   Tonia Ghent, MD    Family History Family History  Problem Relation Age of Onset  . Heart disease Father 8       MI  . Cancer Sister 70       Cervical Cancer  . Cancer Mother 18       gastric cancer- pt states stomach or  liver cancer - unsure which   . Colon cancer Maternal Aunt   . Cancer Maternal Grandmother  gastric cancer   . Diabetes Neg Hx   . Breast cancer Neg Hx   . Colon polyps Neg Hx   . Esophageal cancer Neg Hx   . Rectal cancer Neg Hx     Social History Social History   Tobacco Use  . Smoking status: Never Smoker  . Smokeless tobacco: Never Used  Substance Use Topics  . Alcohol use: No    Alcohol/week: 0.0 standard drinks  . Drug use: No     Allergies   Erythromycin; Ezetimibe-simvastatin; Latex; Macrodantin [nitrofurantoin macrocrystal]; Metformin and related; Morphine; Other; Petroleum jelly [skin protectants, misc.]; Preparation h [phenyleph-shark liv oil-mo-pet]; Procaine hcl; Simvastatin; Sulfamethoxazole; White petrolatum; Adhesive [tape]; and Tape   Review of Systems Review of Systems  Gastrointestinal: Positive for abdominal pain and nausea.  Neurological: Positive for weakness.  All other systems reviewed and are negative.    Physical Exam Updated Vital Signs BP (!) 152/79 (BP Location: Right Arm)   Pulse 67   Temp 98.5 F (36.9 C) (Oral)   Resp 16   Ht '5\' 2"'$  (1.575 m)   Wt 48.1 kg   SpO2 97%   BMI 19.39 kg/m   Physical Exam  Constitutional: She is oriented to person, place, and time.  Elderly female who appears dehydrated and uncomfortable due to pain  HENT:  Head: Normocephalic and atraumatic.  MM dry  Eyes: Pupils are equal, round, and reactive to light. Conjunctivae and EOM are normal.  Neck: Normal range of motion. Neck supple.  Cardiovascular: Normal  rate, regular rhythm and intact distal pulses.  Pulmonary/Chest: Effort normal and breath sounds normal. No respiratory distress. She has no wheezes.  Abdominal: Soft. She exhibits mass. She exhibits no distension. There is tenderness. There is no rebound.  Generalized tenderness palpation of the abdomen.  Mid upper abdominal mass, which patient states is not new.  Negative rebound.  No rigidity or distention.  Musculoskeletal: Normal range of motion.  Neurological: She is alert and oriented to person, place, and time.  Skin: Skin is warm and dry. Capillary refill takes less than 2 seconds.  Psychiatric: She has a normal mood and affect.  Nursing note and vitals reviewed.    ED Treatments / Results  Labs (all labs ordered are listed, but only abnormal results are displayed) Labs Reviewed  COMPREHENSIVE METABOLIC PANEL - Abnormal; Notable for the following components:      Result Value   Glucose, Bld 153 (*)    Albumin 3.4 (*)    All other components within normal limits  CBC WITH DIFFERENTIAL/PLATELET  LIPASE, BLOOD  TSH    EKG None  Radiology Ct Abdomen Pelvis W Contrast  Result Date: 12/01/2017 CLINICAL DATA:  Abdominal pain. Nausea. Vomiting. Constipation. History of pancreatic cancer and hypermetabolic focal sigmoid colon wall thickening. EXAM: CT ABDOMEN AND PELVIS WITH CONTRAST TECHNIQUE: Multidetector CT imaging of the abdomen and pelvis was performed using the standard protocol following bolus administration of intravenous contrast. CONTRAST:  117m OMNIPAQUE IOHEXOL 300 MG/ML  SOLN COMPARISON:  11/26/2017 PET-CT.  11/13/2017 CT abdomen/pelvis. FINDINGS: Lower chest: Bandlike scarring versus atelectasis in the lingula. Coronary atherosclerosis. Hepatobiliary: Normal liver size. Scattered granulomatous liver calcifications are stable. No liver masses. Cholecystectomy. Bile ducts are not convincingly changed and are within expected post cholecystectomy limits with CBD diameter  9 mm with smooth distal tapering. Pancreas: Poorly marginated hypoenhancing 4.8 x 3.1 cm pancreatic body mass, not appreciably changed from 11/13/2017 CT using similar measurement technique. Stable atrophy and duct dilation in  the pancreatic tail. Narrowing of the portosplenic venous confluence by the mass appears slightly worsened. Spleen: Normal size spleen. Scattered subcentimeter hypodense splenic lesions are stable. No new splenic lesion. Adrenals/Urinary Tract: Stable adrenal glands without discrete adrenal nodules. Stable mild fullness of the right renal collecting system without overt hydronephrosis. No left hydronephrosis. Symmetric normal contrast nephrograms. Small simple left renal cysts, largest 1.8 cm in the lower left kidney. Additional subcentimeter hypodense renal cortical lesions in the left kidney are too small to characterize and are unchanged. No new renal lesions. Normal mildly distended bladder. Stomach/Bowel: Small hiatal hernia. Otherwise normal nondistended stomach. Normal caliber small bowel with no small bowel wall thickening. Normal appendix. Oral contrast transits to the splenic flexure of the colon. Irregular annular wall thickening in the distal sigmoid colon is not convincingly changed. There is moderate stool and mild colonic dilatation in the left colon proximal to the sigmoid wall thickening. Moderate sigmoid diverticulosis. There is new circumferential wall thickening and pericolonic fat stranding in the descending colon. Vascular/Lymphatic: Atherosclerotic nonaneurysmal abdominal aorta. Patent hepatic and renal veins. No pathologically enlarged lymph nodes in the abdomen or pelvis. Reproductive: Status post hysterectomy, with no abnormal findings at the vaginal cuff. No adnexal mass. Other: No pneumoperitoneum, ascites or focal fluid collection. Stable small fat containing periumbilical hernia. Clustered mid omental nodularity extending into the periumbilical hernia, with  dominant 1.1 cm solid omental nodule, increased from 0.6 cm on 11/13/2017 CT. New subcentimeter solid left omental nodules (series 3/image 43). Musculoskeletal: No aggressive appearing focal osseous lesions. Moderate thoracolumbar spondylosis. IMPRESSION: 1. No change in irregular annular distal sigmoid colon wall thickening, cannot exclude primary sigmoid neoplasm. 2. No evidence of small-bowel obstruction. Moderate stool throughout the left colon above the site of sigmoid colon wall thickening, with new wall thickening and pericolonic fat stranding in the descending colon. Findings suggest a nonspecific descending colitis, potentially stercoral colitis due to fecal stasis. No free air or abscess. 3. Solid omental nodules are increased in size and number since 11/13/2017 CT, most compatible with progressive peritoneal carcinomatosis. 4. Poorly marginated hypoenhancing pancreatic body mass compatible with primary pancreatic adenocarcinoma, not appreciably changed in size. Worsening narrowing of the portosplenic venous confluence by the pancreatic mass. 5.  Aortic Atherosclerosis (ICD10-I70.0). Electronically Signed   By: Ilona Sorrel M.D.   On: 12/01/2017 19:04    Procedures Procedures (including critical care time)  Medications Ordered in ED Medications  atenolol (TENORMIN) tablet 25 mg (25 mg Oral Given 12/01/17 2219)  levothyroxine (SYNTHROID, LEVOTHROID) tablet 88 mcg (has no administration in time range)  multivitamin with minerals tablet 1 tablet (1 tablet Oral Given 12/01/17 2219)  brimonidine (ALPHAGAN) 0.15 % ophthalmic solution 1 drop (1 drop Right Eye Not Given 12/01/17 2217)  cholecalciferol (VITAMIN D3) tablet 2,000 Units (2,000 Units Oral Given 12/01/17 2219)  dorzolamide-timolol (COSOPT) 22.3-6.8 MG/ML ophthalmic solution 1 drop (0 drops Right Eye Hold 12/01/17 2218)  latanoprost (XALATAN) 0.005 % ophthalmic solution 1 drop (0 drops Right Eye Hold 12/01/17 2218)  enoxaparin (LOVENOX)  injection 40 mg (0 mg Subcutaneous Hold 12/01/17 2215)  acetaminophen (TYLENOL) tablet 1,000 mg (has no administration in time range)  0.9 %  sodium chloride infusion ( Intravenous New Bag/Given 12/01/17 2212)  polyethylene glycol (MIRALAX / GLYCOLAX) packet 17 g (17 g Oral Given 12/01/17 2219)  psyllium (HYDROCIL/METAMUCIL) packet 1 packet (1 packet Oral Given 12/01/17 2219)  sodium chloride 0.9 % bolus 1,000 mL (0 mLs Intravenous Stopped 12/01/17 1830)  fentaNYL (SUBLIMAZE) injection 50 mcg (  50 mcg Intravenous Given 12/01/17 1613)  iohexol (OMNIPAQUE) 300 MG/ML solution 100 mL (100 mLs Intravenous Contrast Given 12/01/17 1814)     Initial Impression / Assessment and Plan / ED Course  I have reviewed the triage vital signs and the nursing notes.  Pertinent labs & imaging results that were available during my care of the patient were reviewed by me and considered in my medical decision making (see chart for details).     Pt presenting for evaluation of abdominal pain, constipation, nausea.  Physical exam shows elderly female who appears uncomfortable.  She is afebrile not tachycardic.  Has significant history of intra-abdominal cancer.  Patient recently had a PET scan 4 days ago, which showed pancreatic mass and possible colon cancer.  However, symptoms started after this PET scan.  As she is having new symptoms and increased pain with difficulty eating, will obtain CT abdomen pelvis to rule out obstruction or other concerning findings.  Basic labs ordered.  Fluids given for dehydration. Fentanyl given for pain.   On reassessment, patient appears much more comfortable.  Reports pain is improved, but not completely gone.  Abs reassuring, no leukocytosis.  Kidney, liver, pancreatic function reassuring.  CT abdomen pelvis pending.  CT shows moderate stool production and colitis concerning for possible stercoral colitis due to fecal stasis. Discussed findings with patient and family, who are  concerned that symptoms will continue to worsen if she goes home. Discussed with attending, Dr. Tyrone Nine agrees to plan. Will call for admission.   Discussed with Dr. Marlowe Sax from [facility], pt to be admitted. Requesting GI consult.   [facility] called, no consult call returned.    Final Clinical Impressions(s) / ED Diagnoses   Final diagnoses:  Colitis  Constipation, unspecified constipation type    ED Discharge Orders    None       Franchot Heidelberg, PA-C 12/01/17 Yoe, Seibert, DO 12/01/17 2319

## 2017-12-01 NOTE — ED Notes (Signed)
Pt transported to CT ?

## 2017-12-01 NOTE — Progress Notes (Signed)
Called by Answering service and tried to call back PA Sophia at [phone] but went straight to VM and not able to leave VM. Told Answering Service to let them know if they call back. If there is an urgent GI issue or a consultation question please page back with a working number; if urgent overnight or in AM. She is not on our list for evaluation at this time.  Justice Britain, MD [facility] Office # [phone]

## 2017-12-01 NOTE — ED Triage Notes (Addendum)
Pt reports constipation x several days and reports decreased in amount of stool. Pt reports its worsened in past two days. Pt tried glycerin suppositories at home. Hx of pancreatic cancer. Upcoming endoscopy and colonoscopy for biopsy. Last normal BM x2-3 days ago, BM today was small per pt.

## 2017-12-02 LAB — GLUCOSE, CAPILLARY
Glucose-Capillary: 129 mg/dL — ABNORMAL HIGH (ref 70–99)
Glucose-Capillary: 135 mg/dL — ABNORMAL HIGH (ref 70–99)
Glucose-Capillary: 129 mg/dL — ABNORMAL HIGH (ref 70–99)

## 2017-12-02 LAB — TSH: TSH: 1.248 u[IU]/mL (ref 0.350–4.500)

## 2017-12-02 MED ORDER — HYDRALAZINE HCL 20 MG/ML IJ SOLN: 10.0000 mg | INTRAMUSCULAR | Status: DC | PRN

## 2017-12-02 MED ORDER — ONDANSETRON HCL 4 MG/2ML IJ SOLN
4.0000 mg | Freq: Four times a day (QID) | INTRAMUSCULAR | Status: DC | PRN
  Administered 2017-12-02: 4 mg via INTRAVENOUS
  Filled 2017-12-02 (×2): qty 2

## 2017-12-02 MED ORDER — HYDROCORTISONE ACETATE 25 MG RE SUPP: 25.0000 mg | Freq: Once | RECTAL | Status: DC

## 2017-12-02 MED ORDER — PEG 3350-KCL-NA BICARB-NACL 420 G PO SOLR
4000.0000 mL | Freq: Once | ORAL | Status: AC
  Administered 2017-12-02: 4000 mL via ORAL
  Filled 2017-12-02: qty 4000

## 2017-12-02 MED ORDER — LACTATED RINGERS IV SOLN
INTRAVENOUS | Status: AC
  Administered 2017-12-02: 21:00:00 via INTRAVENOUS

## 2017-12-02 MED ORDER — KETOROLAC TROMETHAMINE 15 MG/ML IJ SOLN
15.0000 mg | Freq: Four times a day (QID) | INTRAMUSCULAR | Status: DC | PRN
  Administered 2017-12-02: 15 mg via INTRAVENOUS
  Filled 2017-12-02: qty 1

## 2017-12-02 MED ORDER — SORBITOL 70 % SOLN
960.0000 mL | TOPICAL_OIL | Freq: Once | ORAL | Status: AC
  Administered 2017-12-02: 960 mL via RECTAL
  Filled 2017-12-02: qty 473

## 2017-12-02 NOTE — Progress Notes (Signed)
MD informed of pt's increased blood pressure and ongoing nausea

## 2017-12-02 NOTE — Plan of Care (Signed)
Pt still experiencing ongoing constipation and nasuea, MD made aware by this RN

## 2017-12-02 NOTE — Progress Notes (Signed)
PROGRESS NOTE    Molly [name]  HWE:[id number] DOB: 1929/11/15 DOA: 12/01/2017 PCP: Tonia Ghent, MD   Brief Narrative:  Per HPI from Dr. Marlowe Sax: Molly [name] is a 84 y.o. female with medical history significant of recently diagnosed probable pancreatic cancer with peritoneal nodules and sigmoid colon mass, type 2 diabetes, hypertension, hypothyroidism presenting to the hospital for evaluation of constipation.  Patient reports being constipated for the past 2 days and passing very little stool.  She did not have a bowel movement today.  Reports having decreased p.o. intake and just drinking broth the last few days.  Denies having any nausea or vomiting.  Reports having generalized abdominal pain, worse in the left lower quadrant.  Denies having any fevers.  Patient admitted with constipation and abdominal pain related to stercoral colitis from fecal stasis.  There is possibility of a new sigmoid neoplasm that is being worked up in the outpatient setting.  She has not responded well to enema today and have discussed case with GI with recommendations to try bowel prep after rectal exam was performed with no sign of impaction noted.  Assessment & Plan:   Principal Problem:   Constipation Active Problems:   Hypothyroidism   HTN (hypertension)   Abdominal pain   Protein calorie malnutrition (HCC)   Type 2 diabetes mellitus ([place])   1. Constipation and left lower quadrant abdominal pain suspected secondary to stercoral colitis and fecal stasis.  No sign of infection or obstruction noted on imaging.  Discussed case with GI as an enema did not appear to work well this morning.  Rectal exam was performed at bedside with nursing staff present and consent was obtained.  Patient does have extensive external hemorrhoids.  Stool was palpated and was noted to be soft with no sign of impaction.  Continue IV fluid hydration and bowel prep with GoLYTELY.  Consider GI consultation by a.m. if  needed.  Lipase and TSH within normal limits. 2. Recently diagnosed probable pancreatic cancer with peritoneal nodules and sigmoid colon mass.  Patient has ongoing work-up in the outpatient setting with Dr. Burr Medico.  She is scheduled for EUS and colonoscopy on 12/12 with Dr. Ardis Hughs. 3. Protein calorie malnutrition.  Appreciate nutrition consult. 4. Type 2 diabetes.  Blood glucose well controlled.  Monitor. 5. Hypertension-controlled.  Continue home atenolol. 6. Hypothyroidism.  Continue Synthroid.  TSH within normal limits. 7. Type 2 diabetes.  Blood glucose controlled.  Will start to advance diet today.  Consider SSI initiation as needed.   DVT prophylaxis: Lovenox Code Status: Full code Family Communication: Husband at bedside Disposition Plan: Anticipate discharge after bowel movement and improvement in symptoms.  Consider GI consult if patient could not have a BM despite bowel prep use.  Does not appear to have impaction based on rectal exam performed today.  Discussed case with Dr. Rush Landmark who will pass info to GI on call tomorrow in case consult is needed.   Consultants:   None  Procedures:   None  Antimicrobials:   None   Subjective: Patient seen and evaluated today with no new acute complaints or concerns. No acute concerns or events noted overnight.  She continues to have some mild abdominal pain with no bowel movement despite enema use.  Objective: Vitals:   12/02/17 0100 12/02/17 0616 12/02/17 0940 12/02/17 1158  BP: (!) 158/78 (!) 178/80 (!) 157/76 (!) 149/80  Pulse: 69 72 64 66  Resp: 18 18  16   Temp: 98.1 F (36.7 C)  98.4 F (36.9 C)  98.4 F (36.9 C)  TempSrc: Oral Oral  Oral  SpO2: 95% 93%  97%  Weight:      Height:        Intake/Output Summary (Last 24 hours) at 12/02/2017 1304 Last data filed at 12/01/2017 2023 Gross per 24 hour  Intake 1000 ml  Output 1500 ml  Net -500 ml   Filed Weights   12/01/17 2156  Weight: 48.1 kg     Examination:  General exam: Appears calm and comfortable  Respiratory system: Clear to auscultation. Respiratory effort normal. Cardiovascular system: S1 & S2 heard, RRR. No JVD, murmurs, rubs, gallops or clicks. No pedal edema. Gastrointestinal system: Abdomen is nondistended, soft and minimally tender to palpation over the left lower quadrant. No organomegaly or masses felt. Normal bowel sounds heard.  Rectal examination performed with soft stool in rectal vault and no signs of impaction noted.  No overt bleeding identified.  Normal rectal tone. Central nervous system: Alert and oriented. No focal neurological deficits. Extremities: Symmetric 5 x 5 power. Skin: No rashes, lesions or ulcers Psychiatry: Judgement and insight appear normal. Mood & affect appropriate.     Data Reviewed: I have personally reviewed following labs and imaging studies  CBC: Recent Labs  Lab 11/27/17 1050 12/01/17 1557  WBC 6.7 9.7  NEUTROABS 4.0 6.9  HGB 13.3 14.0  HCT 42.0 44.6  MCV 96.6 97.0  PLT 288 300   Basic Metabolic Panel: Recent Labs  Lab 11/27/17 1050 12/01/17 1557  NA 141 137  K 3.4* 3.5  CL 103 100  CO2 30 26  GLUCOSE 204* 153*  BUN 13 13  CREATININE 0.76 0.67  CALCIUM 9.5 9.2   GFR: Estimated Creatinine Clearance: 38.3 mL/min (by C-G formula based on SCr of 0.67 mg/dL). Liver Function Tests: Recent Labs  Lab 11/27/17 1050 12/01/17 1557  AST 16 26  ALT 14 20  ALKPHOS 83 76  BILITOT 0.6 0.8  PROT 6.9 6.7  ALBUMIN 3.4* 3.4*   Recent Labs  Lab 12/01/17 1557  LIPASE 24   No results for input(s): AMMONIA in the last 168 hours. Coagulation Profile: No results for input(s): INR, PROTIME in the last 168 hours. Cardiac Enzymes: No results for input(s): CKTOTAL, CKMB, CKMBINDEX, TROPONINI in the last 168 hours. BNP (last 3 results) No results for input(s): PROBNP in the last 8760 hours. HbA1C: No results for input(s): HGBA1C in the last 72 hours. CBG: Recent  Labs  Lab 11/26/17 1227 12/02/17 0738 12/02/17 1102  GLUCAP 133* 129* 135*   Lipid Profile: No results for input(s): CHOL, HDL, LDLCALC, TRIG, CHOLHDL, LDLDIRECT in the last 72 hours. Thyroid Function Tests: Recent Labs    12/02/17 0731  TSH 1.248   Anemia Panel: No results for input(s): VITAMINB12, FOLATE, FERRITIN, TIBC, IRON, RETICCTPCT in the last 72 hours. Sepsis Labs: No results for input(s): PROCALCITON, LATICACIDVEN in the last 168 hours.  No results found for this or any previous visit (from the past 240 hour(s)).       Radiology Studies: Ct Abdomen Pelvis W Contrast  Result Date: 12/01/2017 CLINICAL DATA:  Abdominal pain. Nausea. Vomiting. Constipation. History of pancreatic cancer and hypermetabolic focal sigmoid colon wall thickening. EXAM: CT ABDOMEN AND PELVIS WITH CONTRAST TECHNIQUE: Multidetector CT imaging of the abdomen and pelvis was performed using the standard protocol following bolus administration of intravenous contrast. CONTRAST:  1109mL OMNIPAQUE IOHEXOL 300 MG/ML  SOLN COMPARISON:  11/26/2017 PET-CT.  11/13/2017 CT abdomen/pelvis. FINDINGS: Lower chest:  Bandlike scarring versus atelectasis in the lingula. Coronary atherosclerosis. Hepatobiliary: Normal liver size. Scattered granulomatous liver calcifications are stable. No liver masses. Cholecystectomy. Bile ducts are not convincingly changed and are within expected post cholecystectomy limits with CBD diameter 9 mm with smooth distal tapering. Pancreas: Poorly marginated hypoenhancing 4.8 x 3.1 cm pancreatic body mass, not appreciably changed from 11/13/2017 CT using similar measurement technique. Stable atrophy and duct dilation in the pancreatic tail. Narrowing of the portosplenic venous confluence by the mass appears slightly worsened. Spleen: Normal size spleen. Scattered subcentimeter hypodense splenic lesions are stable. No new splenic lesion. Adrenals/Urinary Tract: Stable adrenal glands without  discrete adrenal nodules. Stable mild fullness of the right renal collecting system without overt hydronephrosis. No left hydronephrosis. Symmetric normal contrast nephrograms. Small simple left renal cysts, largest 1.8 cm in the lower left kidney. Additional subcentimeter hypodense renal cortical lesions in the left kidney are too small to characterize and are unchanged. No new renal lesions. Normal mildly distended bladder. Stomach/Bowel: Small hiatal hernia. Otherwise normal nondistended stomach. Normal caliber small bowel with no small bowel wall thickening. Normal appendix. Oral contrast transits to the splenic flexure of the colon. Irregular annular wall thickening in the distal sigmoid colon is not convincingly changed. There is moderate stool and mild colonic dilatation in the left colon proximal to the sigmoid wall thickening. Moderate sigmoid diverticulosis. There is new circumferential wall thickening and pericolonic fat stranding in the descending colon. Vascular/Lymphatic: Atherosclerotic nonaneurysmal abdominal aorta. Patent hepatic and renal veins. No pathologically enlarged lymph nodes in the abdomen or pelvis. Reproductive: Status post hysterectomy, with no abnormal findings at the vaginal cuff. No adnexal mass. Other: No pneumoperitoneum, ascites or focal fluid collection. Stable small fat containing periumbilical hernia. Clustered mid omental nodularity extending into the periumbilical hernia, with dominant 1.1 cm solid omental nodule, increased from 0.6 cm on 11/13/2017 CT. New subcentimeter solid left omental nodules (series 3/image 43). Musculoskeletal: No aggressive appearing focal osseous lesions. Moderate thoracolumbar spondylosis. IMPRESSION: 1. No change in irregular annular distal sigmoid colon wall thickening, cannot exclude primary sigmoid neoplasm. 2. No evidence of small-bowel obstruction. Moderate stool throughout the left colon above the site of sigmoid colon wall thickening, with  new wall thickening and pericolonic fat stranding in the descending colon. Findings suggest a nonspecific descending colitis, potentially stercoral colitis due to fecal stasis. No free air or abscess. 3. Solid omental nodules are increased in size and number since 11/13/2017 CT, most compatible with progressive peritoneal carcinomatosis. 4. Poorly marginated hypoenhancing pancreatic body mass compatible with primary pancreatic adenocarcinoma, not appreciably changed in size. Worsening narrowing of the portosplenic venous confluence by the pancreatic mass. 5.  Aortic Atherosclerosis (ICD10-I70.0). Electronically Signed   By: Ilona Sorrel M.D.   On: 12/01/2017 19:04        Scheduled Meds: . atenolol  25 mg Oral Daily  . brimonidine  1 drop Right Eye BID  . cholecalciferol  2,000 Units Oral Daily  . dorzolamide-timolol  1 drop Right Eye BID  . enoxaparin (LOVENOX) injection  40 mg Subcutaneous Q24H  . hydrocortisone  25 mg Rectal Once  . latanoprost  1 drop Right Eye QHS  . levothyroxine  88 mcg Oral Q24H  . multivitamin with minerals  1 tablet Oral Daily  . polyethylene glycol  17 g Oral Daily  . polyethylene glycol-electrolytes  4,000 mL Oral Once  . psyllium  1 packet Oral Daily   Continuous Infusions:   LOS: 0 days    Time spent:  30 minutes    Bowden Boody Darleen Crocker, DO [facility] Pager [phone]  If 7PM-7AM, please contact night-coverage [email or web address] Password [email or web address] 12/02/2017, 1:05 PM

## 2017-12-03 DIAGNOSIS — Z8542 Personal history of malignant neoplasm of other parts of uterus: Secondary | ICD-10-CM | POA: Diagnosis not present

## 2017-12-03 DIAGNOSIS — Z9841 Cataract extraction status, right eye: Secondary | ICD-10-CM | POA: Diagnosis not present

## 2017-12-03 DIAGNOSIS — E46 Unspecified protein-calorie malnutrition: Secondary | ICD-10-CM | POA: Diagnosis present

## 2017-12-03 DIAGNOSIS — K5289 Other specified noninfective gastroenteritis and colitis: Secondary | ICD-10-CM | POA: Diagnosis present

## 2017-12-03 DIAGNOSIS — Z800 Family history of malignant neoplasm of digestive organs: Secondary | ICD-10-CM | POA: Diagnosis not present

## 2017-12-03 DIAGNOSIS — Z8049 Family history of malignant neoplasm of other genital organs: Secondary | ICD-10-CM | POA: Diagnosis not present

## 2017-12-03 DIAGNOSIS — Z8249 Family history of ischemic heart disease and other diseases of the circulatory system: Secondary | ICD-10-CM | POA: Diagnosis not present

## 2017-12-03 DIAGNOSIS — R1013 Epigastric pain: Secondary | ICD-10-CM

## 2017-12-03 DIAGNOSIS — K5900 Constipation, unspecified: Secondary | ICD-10-CM | POA: Diagnosis present

## 2017-12-03 DIAGNOSIS — E039 Hypothyroidism, unspecified: Secondary | ICD-10-CM | POA: Diagnosis present

## 2017-12-03 DIAGNOSIS — E785 Hyperlipidemia, unspecified: Secondary | ICD-10-CM | POA: Diagnosis present

## 2017-12-03 DIAGNOSIS — K598 Other specified functional intestinal disorders: Secondary | ICD-10-CM | POA: Diagnosis present

## 2017-12-03 DIAGNOSIS — Z90710 Acquired absence of both cervix and uterus: Secondary | ICD-10-CM | POA: Diagnosis not present

## 2017-12-03 DIAGNOSIS — H409 Unspecified glaucoma: Secondary | ICD-10-CM | POA: Diagnosis present

## 2017-12-03 DIAGNOSIS — C786 Secondary malignant neoplasm of retroperitoneum and peritoneum: Secondary | ICD-10-CM | POA: Diagnosis present

## 2017-12-03 DIAGNOSIS — Z9842 Cataract extraction status, left eye: Secondary | ICD-10-CM | POA: Diagnosis not present

## 2017-12-03 DIAGNOSIS — C259 Malignant neoplasm of pancreas, unspecified: Secondary | ICD-10-CM | POA: Diagnosis present

## 2017-12-03 DIAGNOSIS — K219 Gastro-esophageal reflux disease without esophagitis: Secondary | ICD-10-CM | POA: Diagnosis present

## 2017-12-03 DIAGNOSIS — Z9049 Acquired absence of other specified parts of digestive tract: Secondary | ICD-10-CM | POA: Diagnosis not present

## 2017-12-03 DIAGNOSIS — Z87442 Personal history of urinary calculi: Secondary | ICD-10-CM | POA: Diagnosis not present

## 2017-12-03 DIAGNOSIS — K644 Residual hemorrhoidal skin tags: Secondary | ICD-10-CM | POA: Diagnosis present

## 2017-12-03 DIAGNOSIS — Z885 Allergy status to narcotic agent status: Secondary | ICD-10-CM | POA: Diagnosis not present

## 2017-12-03 DIAGNOSIS — Z681 Body mass index (BMI) 19 or less, adult: Secondary | ICD-10-CM | POA: Diagnosis not present

## 2017-12-03 DIAGNOSIS — I10 Essential (primary) hypertension: Secondary | ICD-10-CM | POA: Diagnosis present

## 2017-12-03 DIAGNOSIS — E119 Type 2 diabetes mellitus without complications: Secondary | ICD-10-CM | POA: Diagnosis present

## 2017-12-03 LAB — CBC
HCT: 39.5 % (ref 36.0–46.0)
Hemoglobin: 12.5 g/dL (ref 12.0–15.0)
MCH: 29.8 pg (ref 26.0–34.0)
MCHC: 31.6 g/dL (ref 30.0–36.0)
MCV: 94 fL (ref 80.0–100.0)
Platelets: 222 10*3/uL (ref 150–400)
RBC: 4.2 MIL/uL (ref 3.87–5.11)
RDW: 13.2 % (ref 11.5–15.5)
WBC: 6.3 10*3/uL (ref 4.0–10.5)
nRBC: 0 % (ref 0.0–0.2)

## 2017-12-03 LAB — BASIC METABOLIC PANEL
Anion gap: 9 (ref 5–15)
BUN: 7 mg/dL — ABNORMAL LOW (ref 8–23)
CALCIUM: 8.6 mg/dL — AB (ref 8.9–10.3)
CO2: 26 mmol/L (ref 22–32)
Chloride: 103 mmol/L (ref 98–111)
Creatinine, Ser: 0.65 mg/dL (ref 0.44–1.00)
GFR calc Af Amer: >60 mL/min (ref >60)
GFR calc non Af Amer: >60 mL/min (ref >60)
Glucose, Bld: 123 mg/dL — ABNORMAL HIGH (ref 70–99)
Potassium: 2.6 mmol/L — CL (ref 3.5–5.1)
Sodium: 138 mmol/L (ref 135–145)

## 2017-12-03 LAB — GLUCOSE, CAPILLARY
Glucose-Capillary: 114 mg/dL — ABNORMAL HIGH (ref 70–99)
Glucose-Capillary: 167 mg/dL — ABNORMAL HIGH (ref 70–99)
Glucose-Capillary: 109 mg/dL — ABNORMAL HIGH (ref 70–99)

## 2017-12-03 MED ORDER — POTASSIUM CHLORIDE CRYS ER 20 MEQ PO TBCR
40.0000 meq | EXTENDED_RELEASE_TABLET | Freq: Once | ORAL | Status: AC
  Administered 2017-12-03: 40 meq via ORAL
  Filled 2017-12-03: qty 2

## 2017-12-03 MED ORDER — METOCLOPRAMIDE HCL 5 MG/ML IJ SOLN
5.0000 mg | Freq: Three times a day (TID) | INTRAMUSCULAR | Status: DC
  Administered 2017-12-03 – 2017-12-04 (×2): 5 mg via INTRAVENOUS
  Filled 2017-12-03 (×2): qty 2

## 2017-12-03 MED ORDER — BISACODYL 10 MG RE SUPP
10.0000 mg | Freq: Once | RECTAL | Status: AC
  Administered 2017-12-03: 10 mg via RECTAL
  Filled 2017-12-03: qty 1

## 2017-12-03 MED ORDER — BISACODYL 5 MG PO TBEC
5.0000 mg | DELAYED_RELEASE_TABLET | Freq: Once | ORAL | Status: AC
  Administered 2017-12-03: 5 mg via ORAL
  Filled 2017-12-03: qty 1

## 2017-12-03 MED ORDER — SENNOSIDES-DOCUSATE SODIUM 8.6-50 MG PO TABS
2.0000 | ORAL_TABLET | Freq: Once | ORAL | Status: AC
  Administered 2017-12-03: 2 via ORAL
  Filled 2017-12-03: qty 2

## 2017-12-03 MED ORDER — LINACLOTIDE 145 MCG PO CAPS
145.0000 ug | ORAL_CAPSULE | Freq: Every day | ORAL | Status: DC
  Filled 2017-12-03: qty 1

## 2017-12-03 NOTE — Progress Notes (Addendum)
PROGRESS NOTE    Molly [name]  DZH:[id number] DOB: December 25, 1929 DOA: 12/01/2017 PCP: Tonia Ghent, MD   Brief Narrative:  Per HPI from Dr. Marlowe Sax: Molly [name] is a 84 y.o. female with medical history significant of recently diagnosed probable pancreatic cancer with peritoneal nodules and sigmoid colon mass, type 2 diabetes, hypertension, hypothyroidism presenting to the hospital for evaluation of constipation.  Patient reports being constipated for the past 2 days and passing very little stool.  She did not have a bowel movement today.  Reports having decreased p.o. intake and just drinking broth the last few days.  Denies having any nausea or vomiting.  Reports having generalized abdominal pain, worse in the left lower quadrant.  Denies having any fevers.  Patient admitted with constipation and abdominal pain related to stercoral colitis from fecal stasis.  There is possibility of a new sigmoid neoplasm that is being worked up in the outpatient setting.  She has not responded well to enema today and have discussed case with GI with recommendations to try bowel prep after rectal exam was performed with no sign of impaction noted.  Assessment & Plan:   Principal Problem:   Constipation Active Problems:   Hypothyroidism   HTN (hypertension)   Abdominal pain   Protein calorie malnutrition (HCC)   Type 2 diabetes mellitus ([place])   1. Constipation and left lower quadrant abdominal pain suspected secondary to stercoral colitis and fecal stasis.  No sign of infection or obstruction noted on imaging.  Discussed case with GI, Judson Roch Griffin,NP,  Bowel movement after 1.5 L of GoLYTELY. She recommended Linzess and IV Reglan which has been started. Rectal exam was performed previously at bedside  .  Patient does have extensive external hemorrhoids.  Stool was palpated and was noted to be soft with no sign of impaction.  Continue IV fluid hydration and bowel prep with GoLYTELY.    GI  consultation  requested to see if the patient would be eligible for inpatient colonoscopy given difficulty with prep and cleaning out. PET scan also showed significant metabolic activity in the sigmoid colon, suspicious for primary colon cancer. NP Liliane Shi deferred full consult after discussing with Dr Henrene Pastor. We will consult her again to strongly consider this as inpatient . Lipase and TSH within normal limits. 2. Recently diagnosed probable pancreatic cancer with peritoneal nodules and sigmoid colon mass.  Patient has ongoing work-up in the outpatient setting with Dr. Burr Medico.  She is scheduled for EUS and colonoscopy on 12/12 with Dr. Ardis Hughs. She is also scheduled for CT-guided biopsy on 12/06/17 3. Protein calorie malnutrition.  Appreciate nutrition consult. 4. Type 2 diabetes.  Blood glucose well controlled.  Monitor. 5. Hypertension-controlled.  Continue home atenolol. 6. Hypothyroidism.  Continue Synthroid.  TSH within normal limits. 7. Type 2 diabetes.  Blood glucose controlled.  Will start to advance diet today.  Consider SSI initiation as needed.   DVT prophylaxis: Lovenox Code Status: Full code Family Communication: Husband at bedside Disposition Plan: Anticipate discharge after bowel movement and improvement in symptoms.   GI has been consulted Dr. Henrene Pastor is on-call   Consultants:   None  Procedures:   None  Antimicrobials:   None   Subjective:  she has cramping but no bowel movement, she has some abdominal pain last night. Husband is by the bedside and patient was encouraged to continue drinking GoLYTELY  Objective: Vitals:   12/02/17 1158 12/02/17 1800 12/03/17 0032 12/03/17 0532  BP: (!) 149/80 (!) 181/134  131/72 (!) 149/77  Pulse: 66 75 67 62  Resp: 16 16 15 17   Temp: 98.4 F (36.9 C) 98 F (36.7 C) 98 F (36.7 C) 98.1 F (36.7 C)  TempSrc: Oral Oral Oral Oral  SpO2: 97% 99% 95% 92%  Weight:      Height:        Intake/Output Summary (Last 24 hours) at  12/03/2017 1006 Last data filed at 12/03/2017 0400 Gross per 24 hour  Intake 515.08 ml  Output 550 ml  Net -34.92 ml   Filed Weights   12/01/17 2156  Weight: 48.1 kg    Examination:  General exam: Appears calm and comfortable  Respiratory system: Clear to auscultation. Respiratory effort normal. Cardiovascular system: S1 & S2 heard, RRR. No JVD, murmurs, rubs, gallops or clicks. No pedal edema. Gastrointestinal system: Abdomen is nondistended, soft and minimally tender to palpation over the left lower quadrant. No organomegaly or masses felt. Normal bowel sounds heard.  Rectal examination performed with soft stool in rectal vault and no signs of impaction noted.  No overt bleeding identified.  Normal rectal tone. Central nervous system: Alert and oriented. No focal neurological deficits. Extremities: Symmetric 5 x 5 power. Skin: No rashes, lesions or ulcers Psychiatry: Judgement and insight appear normal. Mood & affect appropriate.     Data Reviewed: I have personally reviewed following labs and imaging studies  CBC: Recent Labs  Lab 11/27/17 1050 12/01/17 1557 12/03/17 0513  WBC 6.7 9.7 6.3  NEUTROABS 4.0 6.9  --   HGB 13.3 14.0 12.5  HCT 42.0 44.6 39.5  MCV 96.6 97.0 94.0  PLT 288 286 782   Basic Metabolic Panel: Recent Labs  Lab 11/27/17 1050 12/01/17 1557 12/03/17 0513  NA 141 137 138  K 3.4* 3.5 2.6*  CL 103 100 103  CO2 30 26 26   GLUCOSE 204* 153* 123*  BUN 13 13 7*  CREATININE 0.76 0.67 0.65  CALCIUM 9.5 9.2 8.6*   GFR: Estimated Creatinine Clearance: 38.3 mL/min (by C-G formula based on SCr of 0.65 mg/dL). Liver Function Tests: Recent Labs  Lab 11/27/17 1050 12/01/17 1557  AST 16 26  ALT 14 20  ALKPHOS 83 76  BILITOT 0.6 0.8  PROT 6.9 6.7  ALBUMIN 3.4* 3.4*   Recent Labs  Lab 12/01/17 1557  LIPASE 24   No results for input(s): AMMONIA in the last 168 hours. Coagulation Profile: No results for input(s): INR, PROTIME in the last 168  hours. Cardiac Enzymes: No results for input(s): CKTOTAL, CKMB, CKMBINDEX, TROPONINI in the last 168 hours. BNP (last 3 results) No results for input(s): PROBNP in the last 8760 hours. HbA1C: No results for input(s): HGBA1C in the last 72 hours. CBG: Recent Labs  Lab 11/26/17 1227 12/02/17 0738 12/02/17 1102 12/02/17 1642 12/03/17 0740  GLUCAP 133* 129* 135* 129* 114*   Lipid Profile: No results for input(s): CHOL, HDL, LDLCALC, TRIG, CHOLHDL, LDLDIRECT in the last 72 hours. Thyroid Function Tests: Recent Labs    12/02/17 0731  TSH 1.248   Anemia Panel: No results for input(s): VITAMINB12, FOLATE, FERRITIN, TIBC, IRON, RETICCTPCT in the last 72 hours. Sepsis Labs: No results for input(s): PROCALCITON, LATICACIDVEN in the last 168 hours.  No results found for this or any previous visit (from the past 240 hour(s)).       Radiology Studies: Ct Abdomen Pelvis W Contrast  Result Date: 12/01/2017 CLINICAL DATA:  Abdominal pain. Nausea. Vomiting. Constipation. History of pancreatic cancer and hypermetabolic focal sigmoid colon  wall thickening. EXAM: CT ABDOMEN AND PELVIS WITH CONTRAST TECHNIQUE: Multidetector CT imaging of the abdomen and pelvis was performed using the standard protocol following bolus administration of intravenous contrast. CONTRAST:  140mL OMNIPAQUE IOHEXOL 300 MG/ML  SOLN COMPARISON:  11/26/2017 PET-CT.  11/13/2017 CT abdomen/pelvis. FINDINGS: Lower chest: Bandlike scarring versus atelectasis in the lingula. Coronary atherosclerosis. Hepatobiliary: Normal liver size. Scattered granulomatous liver calcifications are stable. No liver masses. Cholecystectomy. Bile ducts are not convincingly changed and are within expected post cholecystectomy limits with CBD diameter 9 mm with smooth distal tapering. Pancreas: Poorly marginated hypoenhancing 4.8 x 3.1 cm pancreatic body mass, not appreciably changed from 11/13/2017 CT using similar measurement technique. Stable  atrophy and duct dilation in the pancreatic tail. Narrowing of the portosplenic venous confluence by the mass appears slightly worsened. Spleen: Normal size spleen. Scattered subcentimeter hypodense splenic lesions are stable. No new splenic lesion. Adrenals/Urinary Tract: Stable adrenal glands without discrete adrenal nodules. Stable mild fullness of the right renal collecting system without overt hydronephrosis. No left hydronephrosis. Symmetric normal contrast nephrograms. Small simple left renal cysts, largest 1.8 cm in the lower left kidney. Additional subcentimeter hypodense renal cortical lesions in the left kidney are too small to characterize and are unchanged. No new renal lesions. Normal mildly distended bladder. Stomach/Bowel: Small hiatal hernia. Otherwise normal nondistended stomach. Normal caliber small bowel with no small bowel wall thickening. Normal appendix. Oral contrast transits to the splenic flexure of the colon. Irregular annular wall thickening in the distal sigmoid colon is not convincingly changed. There is moderate stool and mild colonic dilatation in the left colon proximal to the sigmoid wall thickening. Moderate sigmoid diverticulosis. There is new circumferential wall thickening and pericolonic fat stranding in the descending colon. Vascular/Lymphatic: Atherosclerotic nonaneurysmal abdominal aorta. Patent hepatic and renal veins. No pathologically enlarged lymph nodes in the abdomen or pelvis. Reproductive: Status post hysterectomy, with no abnormal findings at the vaginal cuff. No adnexal mass. Other: No pneumoperitoneum, ascites or focal fluid collection. Stable small fat containing periumbilical hernia. Clustered mid omental nodularity extending into the periumbilical hernia, with dominant 1.1 cm solid omental nodule, increased from 0.6 cm on 11/13/2017 CT. New subcentimeter solid left omental nodules (series 3/image 43). Musculoskeletal: No aggressive appearing focal osseous  lesions. Moderate thoracolumbar spondylosis. IMPRESSION: 1. No change in irregular annular distal sigmoid colon wall thickening, cannot exclude primary sigmoid neoplasm. 2. No evidence of small-bowel obstruction. Moderate stool throughout the left colon above the site of sigmoid colon wall thickening, with new wall thickening and pericolonic fat stranding in the descending colon. Findings suggest a nonspecific descending colitis, potentially stercoral colitis due to fecal stasis. No free air or abscess. 3. Solid omental nodules are increased in size and number since 11/13/2017 CT, most compatible with progressive peritoneal carcinomatosis. 4. Poorly marginated hypoenhancing pancreatic body mass compatible with primary pancreatic adenocarcinoma, not appreciably changed in size. Worsening narrowing of the portosplenic venous confluence by the pancreatic mass. 5.  Aortic Atherosclerosis (ICD10-I70.0). Electronically Signed   By: Ilona Sorrel M.D.   On: 12/01/2017 19:04        Scheduled Meds: . atenolol  25 mg Oral Daily  . brimonidine  1 drop Right Eye BID  . cholecalciferol  2,000 Units Oral Daily  . dorzolamide-timolol  1 drop Right Eye BID  . enoxaparin (LOVENOX) injection  40 mg Subcutaneous Q24H  . hydrocortisone  25 mg Rectal Once  . latanoprost  1 drop Right Eye QHS  . levothyroxine  88 mcg Oral Q24H  . [  START ON 12/04/2017] linaclotide  145 mcg Oral QAC breakfast  . multivitamin with minerals  1 tablet Oral Daily  . polyethylene glycol  17 g Oral Daily  . psyllium  1 packet Oral Daily   Continuous Infusions:   LOS: 0 days    Time spent: 30 minutes    Reyne Dumas, DO [facility] Pager [phone]  If 7PM-7AM, please contact night-coverage [email or web address] Password [email or web address] 12/03/2017, 10:06 AM

## 2017-12-04 ENCOUNTER — Telehealth: Payer: Self-pay | Admitting: Gastroenterology

## 2017-12-04 ENCOUNTER — Telehealth: Payer: Self-pay | Admitting: *Deleted

## 2017-12-04 LAB — BASIC METABOLIC PANEL
Anion gap: 12 (ref 5–15)
BUN: 7 mg/dL — ABNORMAL LOW (ref 8–23)
CO2: 21 mmol/L — ABNORMAL LOW (ref 22–32)
Calcium: 8.9 mg/dL (ref 8.9–10.3)
Chloride: 106 mmol/L (ref 98–111)
Creatinine, Ser: 0.58 mg/dL (ref 0.44–1.00)
GFR calc Af Amer: >60 mL/min (ref >60)
Glucose, Bld: 125 mg/dL — ABNORMAL HIGH (ref 70–99)
POTASSIUM: 3.8 mmol/L (ref 3.5–5.1)
Sodium: 139 mmol/L (ref 135–145)

## 2017-12-04 LAB — GLUCOSE, CAPILLARY: Glucose-Capillary: 110 mg/dL — ABNORMAL HIGH (ref 70–99)

## 2017-12-04 MED ORDER — POLYETHYLENE GLYCOL 3350 17 G PO PACK: 17.0000 g | PACK | Freq: Every day | ORAL | 0 refills | Status: DC

## 2017-12-04 NOTE — Discharge Summary (Signed)
Physician Discharge Summary  Molly [name] SJG:[id number] DOB: [birth year] DOA: 12/01/2017  PCP: Tonia Ghent, MD  Admit date: 12/01/2017 Discharge date: 12/04/2017  Time spent: 45 minutes  Recommendations for Outpatient Follow-up:  1. IR on 12/5 for CT guided biopsy of Peritoneal nodules 2. EUS and Colonoscopy on 12/12 per [facility] 3. PCP Dr.Duncan in 1 week   Discharge Diagnoses:  Principal Problem:   Constipation   Pancreatic body and tail mass   Sigmoid colon mass   Peritoneal nodules, carcinomatosis suspected   Hypothyroidism   HTN (hypertension)   Abdominal pain   Protein calorie malnutrition ([place])   Type 2 diabetes mellitus ([place])   Discharge Condition: stable  Diet recommendation: diabetic  Filed Weights   12/01/17 2156  Weight: 48.1 kg    History of present illness:  Molly [name] a 84 y.o.femalewwith medical history significant ofrecently diagnosed probable pancreatic cancer with peritoneal nodules and sigmoid colon mass, type 2 diabetes, hypertension, hypothyroidism presenting to the hospital for evaluation of constipation, abd pain, nausea and poor Po intake  Hospital Course:  1. Constipation and left lower quadrant abdominal pain suspected secondary to stercoral colitis and fecal stasis, in the background of sigmoid mass. - No sign of infection or obstruction noted on CT imaging, on Exam there was no evidence of impaction - had a sigmoid colon mass noted on PET scan last week - case discussed with [facility], given Golytely and laxatives -.finally started having BMs yesterday, today she is tolertaing her diet without N/V - she is scheduled for an EUS and Colonoscopy next week on 12/12 per Dr.Jacobs - discharged home in a stable condition, advised to take Miralax daily along with OTC stool softeners that she has at home.  2. Recently diagnosed probable pancreatic cancer with peritoneal nodules and sigmoid colon mass.  Patient has ongoing work-up  in the outpatient setting with Dr. Burr Medico and GI Dr.Jacobs -She is scheduled for EUS and colonoscopy on 12/12 with Dr. Ardis Hughs.  -She is also scheduled for CT-guided biopsy of peritoneal nodule on 12/06/17, called radiology today they are unable to expedite procedure sooner -stable for discharge to continue ongoing above scheduled workup -overall prognosis though will be quite poor in the setting of above suspected metastatic CA  3. Type 2 diabetes -CBGS stable  4. Hypertension-controlled.  Continue home atenolol.  5. Hypothyroidism -continue Synthroid.  TSH within normal limits.   Consultations:  D/w [facility]  Discharge Exam: Vitals:   12/04/17 0535 12/04/17 1001  BP: (!) 144/70 (!) 148/71  Pulse: 60 72  Resp: 19   Temp: 98.1 F (36.7 C)   SpO2: 97%     General: AAOx3 Cardiovascular: S1S2/RRR Respiratory: CTAB  Discharge Instructions   Discharge Instructions    Diet - low sodium heart healthy   Complete by:  As directed    Discharge instructions   Complete by:  As directed    Take miralax with over the counter stool softener (you have at home) daily, if you start having frequent stools skip a day or two   Increase activity slowly   Complete by:  As directed      Allergies as of 12/04/2017      Reactions   Erythromycin    Intolerant   Ezetimibe-simvastatin    REACTION: MUSCLE ACHES AND PAINS.    Latex Swelling   "thrush" type rash and swelling   Macrodantin [nitrofurantoin Macrocrystal] Nausea Only   Metformin And Related Other (See Comments)  Abdominal pain with short and long acting metformin.     Morphine    Chest pain   Other Other (See Comments)   novacaine-sick   Petroleum Jelly [skin Protectants, Misc.] Other (See Comments)   Skin irritation.    Preparation H [phenyleph-shark Liv Oil-mo-pet] Other (See Comments)   Local irritation, burning.    Procaine Hcl    Can tolerate lidocaine   Simvastatin    myalgias   Sulfamethoxazole Other (See  Comments)   Does not agree with her   White Petrolatum Other (See Comments)   Skin irritation.    Adhesive [tape] Rash   Tape Rash   Rash Rash      Medication List    TAKE these medications   acetaminophen 500 MG tablet Commonly known as:  TYLENOL Take 1 tablet (500 mg total) by mouth every 6 (six) hours as needed for mild pain or headache.   atenolol 25 MG tablet Commonly known as:  TENORMIN Take 1 tablet (28m) by mouth daily   Biotin 10 MG Caps Take 1 capsule by mouth daily.   brimonidine 0.1 % Soln Commonly known as:  ALPHAGAN P Apply 1 drop to eye 2 (two) times daily.   Cholecalciferol 50 MCG (2000 UT) Caps Take 1 capsule (2,000 Units total) by mouth daily.   COQ10 PO Take 1 tablet by mouth daily.   dorzolamide-timolol 22.3-6.8 MG/ML ophthalmic solution Commonly known as:  COSOPT Place 1 drop into the right eye 2 (two) times daily.   ESTROVEN MENOPAUSE RELIEF Caps Take by mouth.   glucose blood test strip Use as instructed to check blood sugar once daily and as needed.  Non insulin dependent.  E11.9   latanoprost 0.005 % ophthalmic solution Commonly known as:  XALATAN Place 1 drop into the right eye at bedtime.   levothyroxine 88 MCG tablet Commonly known as:  SYNTHROID, LEVOTHROID Take 1 tablet by mouth daily   lidocaine 5 % ointment Commonly known as:  XYLOCAINE APPLY A SMALL AMOUNT TO RECTUM 3-4 TIMES PER DAY AS NEEDED   multivitamin tablet Take 1 tablet by mouth daily.   ONETOUCH DELICA LANCETS 301UMisc Use to check blood sugar once daily and as needed.  Non insulin dependent.  E11.9   ONETOUCH VERIO FLEX SYSTEM w/Device Kit Use to check blood sugar once daily and as needed.  Non insulin dependent  E11.9   polyethylene glycol packet Commonly known as:  MIRALAX / GLYCOLAX Take 17 g by mouth daily. Start taking on:  12/05/2017      Allergies  Allergen Reactions  . Erythromycin     Intolerant  . Ezetimibe-Simvastatin     REACTION:  MUSCLE ACHES AND PAINS.   .Marland KitchenLatex Swelling    "thrush" type rash and swelling  . Macrodantin [Nitrofurantoin Macrocrystal] Nausea Only  . Metformin And Related Other (See Comments)    Abdominal pain with short and long acting metformin.    .Marland KitchenMorphine     Chest pain  . Other Other (See Comments)    novacaine-sick  . Petroleum Jelly [Skin Protectants, Misc.] Other (See Comments)    Skin irritation.   .Marland KitchenPreparation H [Phenyleph-Shark Liv Oil-Mo-Pet] Other (See Comments)    Local irritation, burning.   . Procaine Hcl     Can tolerate lidocaine  . Simvastatin     myalgias  . Sulfamethoxazole Other (See Comments)    Does not agree with her  . White Petrolatum Other (See Comments)    Skin  irritation.   . Adhesive [Tape] Rash  . Tape Rash    Rash Rash   Follow-up Information    Tonia Ghent, MD. Schedule an appointment as soon as possible for a visit in 1 week(s).   Specialty:  Family Medicine Contact information: [address] [place] [address] [phone]            The results of significant diagnostics from this hospitalization (including imaging, microbiology, ancillary and laboratory) are listed below for reference.    Significant Diagnostic Studies: Ct Abdomen Pelvis W Contrast  Result Date: 12/01/2017 CLINICAL DATA:  Abdominal pain. Nausea. Vomiting. Constipation. History of pancreatic cancer and hypermetabolic focal sigmoid colon wall thickening. EXAM: CT ABDOMEN AND PELVIS WITH CONTRAST TECHNIQUE: Multidetector CT imaging of the abdomen and pelvis was performed using the standard protocol following bolus administration of intravenous contrast. CONTRAST:  169m OMNIPAQUE IOHEXOL 300 MG/ML  SOLN COMPARISON:  11/26/2017 PET-CT.  11/13/2017 CT abdomen/pelvis. FINDINGS: Lower chest: Bandlike scarring versus atelectasis in the lingula. Coronary atherosclerosis. Hepatobiliary: Normal liver size. Scattered granulomatous liver calcifications are stable. No  liver masses. Cholecystectomy. Bile ducts are not convincingly changed and are within expected post cholecystectomy limits with CBD diameter 9 mm with smooth distal tapering. Pancreas: Poorly marginated hypoenhancing 4.8 x 3.1 cm pancreatic body mass, not appreciably changed from 11/13/2017 CT using similar measurement technique. Stable atrophy and duct dilation in the pancreatic tail. Narrowing of the portosplenic venous confluence by the mass appears slightly worsened. Spleen: Normal size spleen. Scattered subcentimeter hypodense splenic lesions are stable. No new splenic lesion. Adrenals/Urinary Tract: Stable adrenal glands without discrete adrenal nodules. Stable mild fullness of the right renal collecting system without overt hydronephrosis. No left hydronephrosis. Symmetric normal contrast nephrograms. Small simple left renal cysts, largest 1.8 cm in the lower left kidney. Additional subcentimeter hypodense renal cortical lesions in the left kidney are too small to characterize and are unchanged. No new renal lesions. Normal mildly distended bladder. Stomach/Bowel: Small hiatal hernia. Otherwise normal nondistended stomach. Normal caliber small bowel with no small bowel wall thickening. Normal appendix. Oral contrast transits to the splenic flexure of the colon. Irregular annular wall thickening in the distal sigmoid colon is not convincingly changed. There is moderate stool and mild colonic dilatation in the left colon proximal to the sigmoid wall thickening. Moderate sigmoid diverticulosis. There is new circumferential wall thickening and pericolonic fat stranding in the descending colon. Vascular/Lymphatic: Atherosclerotic nonaneurysmal abdominal aorta. Patent hepatic and renal veins. No pathologically enlarged lymph nodes in the abdomen or pelvis. Reproductive: Status post hysterectomy, with no abnormal findings at the vaginal cuff. No adnexal mass. Other: No pneumoperitoneum, ascites or focal fluid  collection. Stable small fat containing periumbilical hernia. Clustered mid omental nodularity extending into the periumbilical hernia, with dominant 1.1 cm solid omental nodule, increased from 0.6 cm on 11/13/2017 CT. New subcentimeter solid left omental nodules (series 3/image 43). Musculoskeletal: No aggressive appearing focal osseous lesions. Moderate thoracolumbar spondylosis. IMPRESSION: 1. No change in irregular annular distal sigmoid colon wall thickening, cannot exclude primary sigmoid neoplasm. 2. No evidence of small-bowel obstruction. Moderate stool throughout the left colon above the site of sigmoid colon wall thickening, with new wall thickening and pericolonic fat stranding in the descending colon. Findings suggest a nonspecific descending colitis, potentially stercoral colitis due to fecal stasis. No free air or abscess. 3. Solid omental nodules are increased in size and number since 11/13/2017 CT, most compatible with progressive peritoneal carcinomatosis. 4. Poorly marginated hypoenhancing pancreatic  body mass compatible with primary pancreatic adenocarcinoma, not appreciably changed in size. Worsening narrowing of the portosplenic venous confluence by the pancreatic mass. 5.  Aortic Atherosclerosis (ICD10-I70.0). Electronically Signed   By: Ilona Sorrel M.D.   On: 12/01/2017 19:04   Ct Abdomen Pelvis W Contrast  Result Date: 11/13/2017 CLINICAL DATA:  84 year old female with abnormal weight loss over the past 4 months. History of uterine cancer 2004 post hysterectomy. Prior cholecystectomy. Initial encounter. EXAM: CT ABDOMEN AND PELVIS WITH CONTRAST TECHNIQUE: Multidetector CT imaging of the abdomen and pelvis was performed using the standard protocol following bolus administration of intravenous contrast. CONTRAST:  163m ISOVUE-300 IOPAMIDOL (ISOVUE-300) INJECTION 61% COMPARISON:  02/04/2016 CT. FINDINGS: Lower chest: No worrisome lung base abnormality. Mild cardiomegaly. Prominent  coronary artery calcification. Hepatobiliary: Top-normal size fatty liver. Left intrahepatic biliary duct and common bile duct prominence as noted on prior exam. Post cholecystectomy. Subcentimeter low-density structure inferior aspect right lobe liver unchanged from prior exam. Nonspecific 1.2 cm low-density anterior aspect left lobe liver (series 2, image 22) unchanged. Scattered liver calcifications. Pancreas: New from the prior examination is a 4 x 3.2 x 3.3 cm pancreatic body mass which is causing obstruction and subsequent dilation of the pancreatic duct in the region of pancreatic tail. This is highly suspicious for pancreatic malignancy. This impresses upon the splenic vein, superior aspect of the superior mesenteric vein and confluence of the portal vein. This partially surrounds the common hepatic artery. Extension towards the posterior gastric wall which may be involved. Spleen: Increase in number of low-density lesions within the spleen. Not all can be confirmed as simple cysts. This would be an unusual configuration for metastatic disease which cannot be entirely excluded. Adrenals/Urinary Tract: Left-sided renal low-density lesions some which are cysts and others too small to adequately characterize. No obstructing stone or hydronephrosis. Noncontrast filled views of the urinary bladder unremarkable. No adrenal lesion. Stomach/Bowel: Abnormal appearance of the mid sigmoid colon. Two walls are apposed with masslike projection between the 2 loops. This is in the setting of diverticulosis (series 2 image 60). Vascular/Lymphatic: Significant atherosclerotic changes aorta and aortic branch vessels. Slight ectasia aorta without aneurysm. No large vessel occlusion. Scattered normal size lymph nodes. Reproductive: Post hysterectomy.  No worrisome adnexal mass. Other: No free air. Anterior abdominal wall periumbilical hernia containing fat. Additionally, 3 nodular density seen within the hernia measuring up to  1.6 cm new from prior CT which in the present clinical setting is suspicious for spread of tumor. Musculoskeletal: Scoliosis and degenerative changes lower thoracic and lumbar spine without osseous destructive lesion. IMPRESSION: 1. There are new findings since prior exam with the 2 dominant findings involving the pancreatic body and sigmoid colon as discussed above and below. 2. New from the prior examination is a 4 x 3.2 x 3.3 cm pancreatic body mass which is causing obstruction and subsequent dilation of the pancreatic duct in the region of pancreatic tail. This is highly suspicious for pancreatic malignancy. This impresses upon the splenic vein, superior aspect of the superior mesenteric vein and confluence of the portal vein. This partially surrounds the common hepatic artery. Extension towards the posterior gastric wall which may be involved. Given the significant growth in a relatively short period time, inflammatory process can be considered but is felt to be less likely consideration. 3. Abnormal appearance of the mid sigmoid colon. Two walls are apposed with masslike projection between the 2 loops. This is in the setting of diverticulosis (series 2 image 60). Question result of  muscular hypertrophy, diverticulitis, primary sigmoid mass and less likely metastatic disease. 4. 3 nodular density seen within anterior abdominal wall hernia measuring up to 1.6 cm are new from prior CT which in the present clinical setting is suspicious for spread of tumor. 5. Increase in number of low-density lesions within the spleen. Not all can be confirmed as simple cysts. This would be an unusual configuration for metastatic disease which cannot be entirely excluded. 6. Fatty liver with intrahepatic biliary duct dilation, tiny low-density lesions and calcifications similar to prior exam. 7. Aortic Atherosclerosis (ICD10-I70.0). These results were called by telephone at the time of interpretation on 11/13/2017 at 4:54 pm to  Dr. Elsie Stain , who verbally acknowledged these results. Electronically Signed   By: Genia Del M.D.   On: 11/13/2017 17:27   Nm Pet Image Initial (pi) Skull Base To Thigh  Result Date: 11/26/2017 CLINICAL DATA:  Initial treatment strategy for pancreatic cancer. EXAM: NUCLEAR MEDICINE PET SKULL BASE TO THIGH TECHNIQUE: 6.0 mCi F-18 FDG was injected intravenously. Full-ring PET imaging was performed from the skull base to thigh after the radiotracer. CT data was obtained and used for attenuation correction and anatomic localization. Fasting blood glucose: 133 mg/dl COMPARISON:  CT abdomen 11/13/2017 FINDINGS: Mediastinal blood pool activity: SUV max 2.2 NECK: No significant abnormal hypermetabolic activity in this region. Incidental CT findings: Left ocular prosthesis. Common carotid artery atherosclerotic calcification. Remote fracture the left maxilla. CHEST: Focal right hilar activity with maximum SUV 4.3. Left hilar activity, maximum SUV 4.0. There is a band of volume loss and density in the lingula which may have a small nodule along its upper margin measuring 0.9 by 0.5 cm on image 108/3. The band of density measures 3.8 by 1.1 cm. This has low-grade accentuated metabolic activity, maximum SUV 3.2. There is a cluster of small nodules posteriorly in the right upper lobe as shown for example on image 93/3. One of the larger nodules measures 0.9 by 0.4 cm on image 91/3. This has a maximum SUV of 1.8. Incidental CT findings: Localized cylindrical bronchiectasis in the left upper lobe. Coronary, aortic arch, and branch vessel atherosclerotic vascular disease. Mild-to-moderate cardiomegaly. ABDOMEN/PELVIS: The hypoenhancing mass of the pancreatic body and tail has maximum SUV of 5.6, with abnormal metabolic activity measuring roughly 5.0 by 2.7 cm. The dorsal pancreatic duct distal to this mass is dilated. There is a region of concern for sigmoid colon mass on prior CT; on today's PET-CT this thickened  portion of the sigmoid colon is hypermetabolic with a maximum SUV of 7.4, and adjacent mild nodularity suspicious for local nodal involvement, including a 7 mm in short axis lymph node on image 221/3 and an 8 mm perirectal node eccentric to the left on image 227/3. There are 4 hypermetabolic nodules along the omentum. Two of these are in a small infraumbilical hernia, with the larger measuring 1.4 by 0.8 cm on image 205/3, with maximum SUV 3.9. The other adjacent omental nodules are of similar size and activity level. There is also some mild nodularity along a small left inguinal hernia which appears increased from 02/04/2016, maximum SUV in this vicinity 2.8 Focal activity along the vaginal vestibule, maximum SUV 20.1, probably from urinary incontinence. Incidental CT findings: Calcifications in the dome of the right hepatic lobe, likely postinflammatory. Cholecystectomy. Aortoiliac atherosclerotic vascular disease. Photopenic lesion of the left kidney lower pole favoring cyst. Chronic mural thrombus in a mildly ectatic infrarenal abdominal aorta. Prominent stool throughout the colon favors constipation. Scattered sigmoid  colon diverticula. SKELETON: No significant abnormal hypermetabolic activity in this region. Incidental CT findings: Old left facial fractures. IMPRESSION: 1. Approximately 5 cm hypermetabolic mass in the pancreatic body and tail, maximum SUV 5.6, with morphologic findings suspicious for pancreatic adenocarcinoma. 2. However, there is also a hypermetabolic mass in the sigmoid colon with adjacent paracolic adenopathy. This mass has a maximum SUV of 7.4. A synchronous colon cancer is a distinct possibility. 3. Abnormal hypermetabolic nodules in the omentum, both in and adjacent to a small infraumbilical hernia, suspicious for metastatic disease. 4. Mildly hypermetabolic bilateral hilar lymph nodes. There is also some low-grade accentuated metabolic activity along a region of lingular atelectasis  with some mild adjacent nodularity, as well as clustered nodularity posteriorly in the right upper lobe. I am not completely convinced that the thoracic findings represent malignancy; atypical infectious bronchiolitis with reactive lymph nodes might have a similar appearance, but this certainly deserves surveillance. Also consider a baseline CT of the chest with contrast. 5. Other imaging findings of potential clinical significance: Aortic Atherosclerosis (ICD10-I70.0). Coronary atherosclerosis with mild-to-moderate cardiomegaly. Prominent stool throughout the colon favors constipation. Old left facial fractures. Electronically Signed   By: Van Clines M.D.   On: 11/26/2017 16:19    Microbiology: No results found for this or any previous visit (from the past 240 hour(s)).   Labs: Basic Metabolic Panel: Recent Labs  Lab 12/01/17 1557 12/03/17 0513 12/04/17 0820  NA 137 138 139  K 3.5 2.6* 3.8  CL 100 103 106  CO2 26 26 21*  GLUCOSE 153* 123* 125*  BUN 13 7* 7*  CREATININE 0.67 0.65 0.58  CALCIUM 9.2 8.6* 8.9   Liver Function Tests: Recent Labs  Lab 12/01/17 1557  AST 26  ALT 20  ALKPHOS 76  BILITOT 0.8  PROT 6.7  ALBUMIN 3.4*   Recent Labs  Lab 12/01/17 1557  LIPASE 24   No results for input(s): AMMONIA in the last 168 hours. CBC: Recent Labs  Lab 12/01/17 1557 12/03/17 0513  WBC 9.7 6.3  NEUTROABS 6.9  --   HGB 14.0 12.5  HCT 44.6 39.5  MCV 97.0 94.0  PLT 286 222   Cardiac Enzymes: No results for input(s): CKTOTAL, CKMB, CKMBINDEX, TROPONINI in the last 168 hours. BNP: BNP (last 3 results) No results for input(s): BNP in the last 8760 hours.  ProBNP (last 3 results) No results for input(s): PROBNP in the last 8760 hours.  CBG: Recent Labs  Lab 12/02/17 1642 12/03/17 0740 12/03/17 1057 12/03/17 1614 12/04/17 0901  GLUCAP 129* 114* 167* 109* 110*       Signed:  Domenic Polite MD.  [facility] 12/04/2017, 3:50 PM

## 2017-12-04 NOTE — Telephone Encounter (Signed)
Patient's daughter advised that at this time, Dr. Rush Landmark and Dr. Ardis Hughs do not have any earlier availability to expedite the colonoscopy and EUS.  I reassured the daughter that the appts had not been rescheduled and this was the originally scheduled date with the patient and her husband.  Patient is to have a peritoneal mass bx done this Thursday. I explained that we would not be able to do a EUS/colonoscopy and peritoneal bx on 12/06/17.  She will keep the appt on 12/13/17.  I will keep an eye out for a cancellation on Dr. Donneta Romberg schedule. She plans to contact Dr. Ernestina Penna office to make follow up the week of 12/17/17.

## 2017-12-04 NOTE — Telephone Encounter (Signed)
"  Ruther Ephraim Bourassa's daughter, P.O.A. Ellie Lunch [phone]) calling to notify mom was hospitalized 12-01-2017 through today; is very sick and I want to make sure she has the colonoscopy, endoscopy scheduled.  Mom and Dad say someone called telling them test before thanksgiving was rescheduled to December 13, 2017 but there is no record of this anywhere."  Routing call information for provider review.   Confirmed no future appointments scheduled with [facility] or any scheduled appointments December 12th per [facility].  Provided  GI number to confirm reported call received by mother.

## 2017-12-04 NOTE — Telephone Encounter (Signed)
Her chart indicates she's scheduled for CT biopsy on 12/5 and EUS 12/12. Nothing further at this point. Will f/u after biopsy.  Thanks, Regan Rakers

## 2017-12-04 NOTE — Telephone Encounter (Signed)
Pt's daughter Claiborne Billings, called requesting EUS be moved up before 12/13/17, she stated that her mother is very ill and Dr. Conception Oms (oncologist) has requested that procedure be done sooner than 12/12.

## 2017-12-04 NOTE — Progress Notes (Signed)
Nutrition Brief Note  RD received consult for assessment of nutritional needs/ status.   Of note, consult was placed on 12/01/17 at 2139 when pt was in ED observation unit. Pt did not transfer to floor until 12/03/17 at 2039, so RD was unable to complete consult within 48 hour time period.   Per MD, pt will be discharging today (discharge orders in place). Pt has also been followed by outpatient RD at [facility]. If further nutritional needs arise prior to discharge, please re-consult RD.   Chatham Howington A. Jimmye Norman, RD, LDN, CDE Pager: [phone] After hours Pager: [phone]

## 2017-12-05 ENCOUNTER — Other Ambulatory Visit: Payer: Self-pay | Admitting: Physician Assistant

## 2017-12-05 ENCOUNTER — Other Ambulatory Visit: Payer: Self-pay | Admitting: Student

## 2017-12-05 ENCOUNTER — Telehealth: Payer: Self-pay

## 2017-12-05 NOTE — Telephone Encounter (Signed)
Transition Care Management Follow-up Telephone Call   Date discharged? 12/04/2017   How have you been since you were released from the hospital? Feeling better since been home.   Do you understand why you were in the hospital? Yes   Do you understand the discharge instructions? Yes    Where were you discharged to? Home   Items Reviewed:  Medications reviewed: Yes  Allergies reviewed: Yes  Dietary changes reviewed: Yes-no changes  Referrals reviewed: Yes- patient has CT biopsy on 12/06/17, appointment on 12/10/17 with oncology-(patient will check on this because she does not recall this appointment been made)   Functional Questionnaire:   Activities of Daily Living (ADLs):   She states they are independent in the following:ambulation, feeding, grooming, dressing, hygiene, feeding, bathing, toileting. States they require assistance with the following: some incontinence issue.   Any transportation issues/concerns?: No   Any patient concerns? No  Confirmed importance and date/time of follow-up visits scheduled: Tried to make an appointment to follow up with Dr Damita Dunnings but patient said she did not recall any suggestions about seen Dr Damita Dunnings and did not have this put on her paperwork that she has. Patient has several upcoming appointments with other providers and I will check with Dr Damita Dunnings on whether patient needs to come and see [place] also.  Confirmed with patient if condition begins to worsen call PCP or go to the ER.  Patient was given the office number and encouraged to call back with question or concerns.  : Yes

## 2017-12-05 NOTE — Telephone Encounter (Signed)
See previous note.   Voicemail received with return call to Molly [name]'s daughter Molly [name].  Writer left message with Endoscopy info as requested by [employer]    12-13-2017 Endoscopy scheduled at [facility].  No further information provided.  Pre-admit will contact per protocol.  Endoscopy noted under Surg/Procs appointment desk reads 10:45 am.  Communicated Biopsy scheduled 12-06-2017.  with prep information. Biopsy noted under future appointment desk. See appointment notes.  Arrive 6:00 am to [facility] radiology nurses station for 8:00 am biopsy.  NPO after midnight.  No blood thinners.  Driver needed.       Expect [facility] F/U after biopsy; scheduling is pending.

## 2017-12-05 NOTE — Telephone Encounter (Signed)
Tried to make an appointment to follow up with Dr Damita Dunnings (see TCM below, thank you) but patient said she did not recall any suggestions about seen Dr Damita Dunnings and did not have this put on her paperwork that she has. Patient has several upcoming appointments with other providers and I will check with Dr Damita Dunnings on whether patient needs to come and see [place] also.

## 2017-12-06 ENCOUNTER — Ambulatory Visit (HOSPITAL_COMMUNITY)
Admission: RE | Admit: 2017-12-06 | Discharge: 2017-12-06 | Disposition: A | Discharge status: Home / Self Care | Payer: PPO | Source: Ambulatory Visit | Attending: Hematology | Admitting: Hematology

## 2017-12-06 ENCOUNTER — Encounter (HOSPITAL_COMMUNITY): Payer: Self-pay

## 2017-12-06 ENCOUNTER — Encounter: Payer: Self-pay | Admitting: Nutrition

## 2017-12-06 DIAGNOSIS — C259 Malignant neoplasm of pancreas, unspecified: Secondary | ICD-10-CM | POA: Diagnosis not present

## 2017-12-06 DIAGNOSIS — Z79899 Other long term (current) drug therapy: Secondary | ICD-10-CM | POA: Insufficient documentation

## 2017-12-06 DIAGNOSIS — Z79890 Hormone replacement therapy (postmenopausal): Secondary | ICD-10-CM | POA: Diagnosis not present

## 2017-12-06 DIAGNOSIS — Z8249 Family history of ischemic heart disease and other diseases of the circulatory system: Secondary | ICD-10-CM | POA: Diagnosis not present

## 2017-12-06 DIAGNOSIS — Z8542 Personal history of malignant neoplasm of other parts of uterus: Secondary | ICD-10-CM | POA: Diagnosis not present

## 2017-12-06 DIAGNOSIS — Z800 Family history of malignant neoplasm of digestive organs: Secondary | ICD-10-CM | POA: Diagnosis not present

## 2017-12-06 DIAGNOSIS — Z888 Allergy status to other drugs, medicaments and biological substances status: Secondary | ICD-10-CM | POA: Insufficient documentation

## 2017-12-06 DIAGNOSIS — Z9889 Other specified postprocedural states: Secondary | ICD-10-CM | POA: Insufficient documentation

## 2017-12-06 DIAGNOSIS — C786 Secondary malignant neoplasm of retroperitoneum and peritoneum: Secondary | ICD-10-CM | POA: Diagnosis not present

## 2017-12-06 DIAGNOSIS — Z881 Allergy status to other antibiotic agents status: Secondary | ICD-10-CM | POA: Diagnosis not present

## 2017-12-06 DIAGNOSIS — Z90710 Acquired absence of both cervix and uterus: Secondary | ICD-10-CM | POA: Insufficient documentation

## 2017-12-06 DIAGNOSIS — R1909 Other intra-abdominal and pelvic swelling, mass and lump: Secondary | ICD-10-CM | POA: Diagnosis not present

## 2017-12-06 DIAGNOSIS — E119 Type 2 diabetes mellitus without complications: Secondary | ICD-10-CM | POA: Diagnosis not present

## 2017-12-06 DIAGNOSIS — E039 Hypothyroidism, unspecified: Secondary | ICD-10-CM | POA: Diagnosis not present

## 2017-12-06 DIAGNOSIS — Z882 Allergy status to sulfonamides status: Secondary | ICD-10-CM | POA: Diagnosis not present

## 2017-12-06 DIAGNOSIS — I10 Essential (primary) hypertension: Secondary | ICD-10-CM | POA: Diagnosis not present

## 2017-12-06 DIAGNOSIS — Z9049 Acquired absence of other specified parts of digestive tract: Secondary | ICD-10-CM | POA: Diagnosis not present

## 2017-12-06 DIAGNOSIS — Z885 Allergy status to narcotic agent status: Secondary | ICD-10-CM | POA: Insufficient documentation

## 2017-12-06 DIAGNOSIS — D490 Neoplasm of unspecified behavior of digestive system: Secondary | ICD-10-CM

## 2017-12-06 DIAGNOSIS — C801 Malignant (primary) neoplasm, unspecified: Secondary | ICD-10-CM | POA: Diagnosis not present

## 2017-12-06 LAB — CBC
HCT: 42.7 % (ref 36.0–46.0)
Hemoglobin: 13.4 g/dL (ref 12.0–15.0)
MCH: 30.2 pg (ref 26.0–34.0)
MCHC: 31.4 g/dL (ref 30.0–36.0)
MCV: 96.4 fL (ref 80.0–100.0)
Platelets: 317 10*3/uL (ref 150–400)
RBC: 4.43 MIL/uL (ref 3.87–5.11)
RDW: 13.2 % (ref 11.5–15.5)
WBC: 6.1 10*3/uL (ref 4.0–10.5)
nRBC: 0 % (ref 0.0–0.2)

## 2017-12-06 LAB — PROTIME-INR
INR: 1.02
Prothrombin Time: 13.3 seconds (ref 11.4–15.2)

## 2017-12-06 LAB — APTT: aPTT: 32 seconds (ref 24–36)

## 2017-12-06 LAB — GLUCOSE, CAPILLARY
Glucose-Capillary: 147 mg/dL — ABNORMAL HIGH (ref 70–99)
Glucose-Capillary: 103 mg/dL — ABNORMAL HIGH (ref 70–99)

## 2017-12-06 MED ORDER — LIDOCAINE HCL 1 % IJ SOLN
INTRAMUSCULAR | Status: AC
  Filled 2017-12-06: qty 20

## 2017-12-06 MED ORDER — MIDAZOLAM HCL 2 MG/2ML IJ SOLN
INTRAMUSCULAR | Status: AC
  Filled 2017-12-06: qty 2

## 2017-12-06 MED ORDER — MIDAZOLAM HCL 2 MG/2ML IJ SOLN
INTRAMUSCULAR | Status: AC | PRN
  Administered 2017-12-06 (×2): 0.5 mg via INTRAVENOUS

## 2017-12-06 MED ORDER — ATENOLOL 25 MG PO TABS
25.0000 mg | ORAL_TABLET | Freq: Every day | ORAL | Status: DC
  Filled 2017-12-06: qty 1

## 2017-12-06 MED ORDER — FENTANYL CITRATE (PF) 100 MCG/2ML IJ SOLN
INTRAMUSCULAR | Status: AC
  Filled 2017-12-06: qty 2

## 2017-12-06 MED ORDER — FENTANYL CITRATE (PF) 100 MCG/2ML IJ SOLN
INTRAMUSCULAR | Status: AC | PRN
  Administered 2017-12-06 (×2): 25 ug via INTRAVENOUS

## 2017-12-06 MED ORDER — SODIUM CHLORIDE 0.9 % IV SOLN
INTRAVENOUS | Status: AC | PRN
  Administered 2017-12-06: 10 mL/h via INTRAVENOUS

## 2017-12-06 MED ORDER — SODIUM CHLORIDE 0.9 % IV SOLN: INTRAVENOUS | Status: DC

## 2017-12-06 NOTE — Procedures (Signed)
  Procedure: CT core biopsy peritoneal nodule  18g x3 EBL:   minimal Complications:  none immediate  See full dictation in [facility].  Dillard Cannon MD Main # [phone] Pager  [phone]

## 2017-12-06 NOTE — Progress Notes (Signed)
Patient cancelled appointment.

## 2017-12-06 NOTE — Telephone Encounter (Signed)
Left detailed message on voicemail.  

## 2017-12-06 NOTE — Discharge Instructions (Signed)
Needle Biopsy, Care After °Refer to this sheet in the next few weeks. These instructions provide you with information about caring for yourself after your procedure. Your health care provider may also give you more specific instructions. Your treatment has been planned according to current medical practices, but problems sometimes occur. Call your health care provider if you have any problems or questions after your procedure. °What can I expect after the procedure? °After your procedure, it is common to have soreness, bruising, or mild pain at the biopsy site. This should go away in a few days. °Follow these instructions at home: °· Rest as directed by your health care provider. °· Take medicines only as directed by your health care provider. °· There are many different ways to close and cover the biopsy site, including stitches (sutures), skin glue, and adhesive strips. Follow your health care provider's instructions about: °? Biopsy site care. °? Bandage (dressing) changes and removal. °? Biopsy site closure removal. °· Check your biopsy site every day for signs of infection. Watch for: °? Redness, swelling, or pain. °? Fluid, blood, or pus. °Contact a health care provider if: °· You have a fever. °· You have redness, swelling, or pain at the biopsy site that lasts longer than a few days. °· You have fluid, blood, or pus coming from the biopsy site. °· You feel nauseous. °· You vomit. °Get help right away if: °· You have shortness of breath. °· You have trouble breathing. °· You have chest pain. °· You feel dizzy or you faint. °· You have bleeding that does not stop with pressure or a bandage. °· You cough up blood. °· You have pain in your abdomen. °This information is not intended to replace advice given to you by your health care provider. Make sure you discuss any questions you have with your health care provider. °Document Released: 05/05/2014 Document Revised: 05/27/2015 Document Reviewed:  12/15/2013 °Elsevier Interactive Patient Education © 2018 [employer] °Moderate Conscious Sedation, Adult, Care After °These instructions provide you with information about caring for yourself after your procedure. Your health care provider may also give you more specific instructions. Your treatment has been planned according to current medical practices, but problems sometimes occur. Call your health care provider if you have any problems or questions after your procedure. °What can I expect after the procedure? °After your procedure, it is common: °· To feel sleepy for several hours. °· To feel clumsy and have poor balance for several hours. °· To have poor judgment for several hours. °· To vomit if you eat too soon. ° °Follow these instructions at home: °For at least 24 hours after the procedure: ° °· Do not: °? Participate in activities where you could fall or become injured. °? Drive. °? Use heavy machinery. °? Drink alcohol. °? Take sleeping pills or medicines that cause drowsiness. °? Make important decisions or sign legal documents. °? Take care of children on your own. °· Rest. °Eating and drinking °· Follow the diet recommended by your health care provider. °· If you vomit: °? Drink water, juice, or soup when you can drink without vomiting. °? Make sure you have little or no nausea before eating solid foods. °General instructions °· Have a responsible adult stay with you until you are awake and alert. °· Take over-the-counter and prescription medicines only as told by your health care provider. °· If you smoke, do not smoke without supervision. °· Keep all follow-up visits as told by your health care   provider. This is important. °Contact a health care provider if: °· You keep feeling nauseous or you keep vomiting. °· You feel light-headed. °· You develop a rash. °· You have a fever. °Get help right away if: °· You have trouble breathing. °This information is not intended to replace advice given to you  by your health care provider. Make sure you discuss any questions you have with your health care provider. °Document Released: 10/09/2012 Document Revised: 05/24/2015 Document Reviewed: 04/10/2015 °Elsevier Interactive Patient Education © 2018 [employer] ° °

## 2017-12-06 NOTE — Telephone Encounter (Signed)
The hospital doctors mentioned in the discharge summary that they want her to follow-up with me about a week after discharge.  If there is an acute issue then it makes sense for the patient to come in and see me.  Otherwise I think it makes sense for her to get through her upcoming appointments and then go from there.  I am always glad to see the patient but I agree with her that going to the other appointments would make more sense at this point.  Let me know if there is anything I can do otherwise.  Thanks.

## 2017-12-06 NOTE — Consult Note (Signed)
Chief Complaint: Patient was seen in consultation today for peritoneal nodule biopsy.  Referring Physician(s): Feng,Yan  Supervising Physician: Arne Cleveland  Patient Status: Molly [name] - Out-pt  History of Present Illness: Molly [name] is a 84 y.o. female with a past medical history significant for DM II, diverticulosis, GERD, glaucoma, cataracts, HLD, HTN, history of uterine cancer s/p hysterectomy (1998), recently diagnosed probable pancreatic cancer and hypothyroidism who presents today for image guided biopsy of a peritoneal nodule. Molly [name] was diagnosed with new onset DM II 07/2017, she had difficulty tolerating medications and began to lose weight - she was sent for a CT abdomen/pelvis by her PCP (Dr. Damita Dunnings) which showed a large mass in the pancreatic tail with pancreatic duct dilatation and small peritoneal nodule at the abdominal wall hernia site. She was seen by Dr. Burr Medico on 11/15/17 for consultation and a PET scan was ordered which was performed on 11/25 and showed a 5 cm hypermetabolic mass in the pancreatic body and tail suspicious for pancreatic adenocarcinoma, a hypermetabolic mass in the sigmoid colon with adjacent paracolic adenopathy, abnormal hypermetabolic nodules in the omentum, both in and adjacent to a small infraumbilical hernia suspicious for metastatic disease and mildly hypermetabolic bilateral hilar lymph nodes. Request has been made to IR for biopsy of the hypermetabolic peritoneal nodules.  Patient reports she was just in the hospital (discharged 12/3) for severe constipation. She states she was given several medications which did not help until eventually she was seen by GI and she was given an enema which helped her constipation, she now takes a stool softener and miralax. She is scheduled for an EGD/colonoscopy for further evaluation of her the sigmoid mass. She understands what procedure she is having today and the reasoning for it.  Past Medical History:    Diagnosis Date  . Cataract    removed both eyes  . Diabetes mellitus without complication ([place]) 02/04/3433   diet controlled   . Diverticulosis of colon   . GERD (gastroesophageal reflux disease)   . Glaucoma   . Hyperlipidemia   . Hypertension   . Kidney stones   . MALIGNANT NEOPLASM OF UTERUS PART UNSPECIFIED 1998  . Pancreatic cancer ([place])   . Thyroid disease    hypothyroidism    Past Surgical History:  Procedure Laterality Date  . ABDOMINAL HYSTERECTOMY  12/22/1996   Uterine Cancer Dr Phineas Real  . BREAST BIOPSY  1996   Benign  . BREAST EXCISIONAL BIOPSY Left   . CHOLECYSTECTOMY    . COLONOSCOPY    . Detached Retina Repair OS    . FACIAL LACERATION REPAIR Left 01/20/2015   Procedure: FACIAL LACERATION REPAIR;  Surgeon: Danice Goltz, MD;  Location: [place];  Service: Ophthalmology;  Laterality: Left;  . Release R Long Finger A1 Pulley  01/23/2008   Dr Daylene Katayama  . RUPTURED GLOBE EXPLORATION AND REPAIR Left 01/20/2015   Procedure: REPAIR OF RUPTURED GLOBE and FACIAL LACERATIONS;  Surgeon: Danice Goltz, MD;  Location: [place];  Service: Ophthalmology;  Laterality: Left;  . UPPER GASTROINTESTINAL ENDOSCOPY      Allergies: Erythromycin; Ezetimibe-simvastatin; Latex; Macrodantin [nitrofurantoin macrocrystal]; Metformin and related; Morphine; Other; Petroleum jelly [skin protectants, misc.]; Preparation h [phenyleph-shark liv oil-mo-pet]; Procaine hcl; Simvastatin; Sulfamethoxazole; White petrolatum; Adhesive [tape]; and Tape  Medications: Prior to Admission medications   Medication Sig Start Date End Date Taking? Authorizing Provider  acetaminophen (TYLENOL) 500 MG tablet Take 1 tablet (500 mg total) by mouth every 6 (six) hours as needed for  mild pain or headache. 01/22/15  Yes Dhungel, Nishant, MD  atenolol (TENORMIN) 25 MG tablet Take 1 tablet (55m) by mouth daily 10/23/17  Yes DTonia Ghent MD  Biotin 10 MG CAPS Take 1 capsule by mouth daily.    Yes [provider]  Black Cohosh-SoyIsoflav-Magnol (ESTROVEN MENOPAUSE RELIEF) CAPS Take by mouth.   Yes [provider]  brimonidine (ALPHAGAN P) 0.1 % SOLN Apply 1 drop to eye 2 (two) times daily.   Yes [provider]  Cholecalciferol ([employer] VITAMIN D) 2000 UNITS CAPS Take 1 capsule (2,000 Units total) by mouth daily. 11/23/14  Yes DTonia Ghent MD  Coenzyme Q10 (COQ10 PO) Take 1 tablet by mouth daily.    Yes [provider]  dorzolamide-timolol (COSOPT) 22.3-6.8 MG/ML ophthalmic solution Place 1 drop into the right eye 2 (two) times daily.    Yes [provider]  latanoprost (XALATAN) 0.005 % ophthalmic solution Place 1 drop into the right eye at bedtime.    Yes [provider]  levothyroxine (SYNTHROID, LEVOTHROID) 88 MCG tablet Take 1 tablet by mouth daily 08/03/17  Yes DTonia Ghent MD  Multiple Vitamin (MULTIVITAMIN) tablet Take 1 tablet by mouth daily.     Yes [provider]  Blood Glucose Monitoring Suppl (OOaklyn w/Device KIT Use to check blood sugar once daily and as needed.  Non insulin dependent  E11.9 07/16/17   DTonia Ghent MD  glucose blood ([facility]) test strip Use as instructed to check blood sugar once daily and as needed.  Non insulin dependent.  E11.9 09/17/17   DTonia Ghent MD  lidocaine (XYLOCAINE) 5 % ointment APPLY A SMALL AMOUNT TO RECTUM 3-4 TIMES PER DAY AS NEEDED 10/10/17   DTonia Ghent MD  [facility] LANCETS 352DMISC Use to check blood sugar once daily and as needed.  Non insulin dependent.  E11.9 09/17/17   DTonia Ghent MD  polyethylene glycol ([facility]/ GFloria Raveling packet Take 17 g by mouth daily. 12/05/17   JDomenic Polite MD     Family History  Problem Relation Age of Onset  . Heart disease Father 654      MI  . Cancer Sister 450      Cervical Cancer  . Cancer Mother 837      gastric cancer- pt states stomach or  liver cancer - unsure which   . Colon cancer  Maternal Aunt   . Cancer Maternal Grandmother        gastric cancer   . Diabetes Neg Hx   . Breast cancer Neg Hx   . Colon polyps Neg Hx   . Esophageal cancer Neg Hx   . Rectal cancer Neg Hx     Social History   Socioeconomic History  . Marital status: Married    Spouse name: MTrilby Drummer . Number of children: 3  . Years of education: Not on file  . Highest education level: Not on file  Occupational History  . Occupation: RCabin crew   Comment: Retired  . 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 Financial resource strain: Not on file  . Food insecurity:    Worry: Not on file    Inability: Not on file  . Transportation needs:    Medical: Not on file    Non-medical: Not on file  Tobacco Use  . Smoking status: Never Smoker  . Smokeless tobacco: Never  Used  Substance and Sexual Activity  . Alcohol use: No    Alcohol/week: 0.0 standard drinks  . Drug use: No  . Sexual activity: Not Currently  Lifestyle  . Physical activity:    Days per week: Not on file    Minutes per session: Not on file  . Stress: Not on file  Relationships  . Social connections:    Talks on phone: Not on file    Gets together: Not on file    Attends religious service: Not on file    Active member of club or organization: Not on file    Attends meetings of clubs or organizations: Not on file    Relationship status: Not on file  Other Topics Concern  . Not on file  Social History Narrative   Married 1952   Retired Network engineer and bookkeeper     Review of Systems: A 12 point ROS discussed and pertinent positives are indicated in the HPI above.  All other systems are negative.  Review of Systems  Constitutional: Positive for unexpected weight change. Negative for chills and fever.  Respiratory: Negative for cough and shortness of breath.   Cardiovascular: Negative for chest pain.  Gastrointestinal: Positive for abdominal pain (when constipated) and constipation (improved).  Negative for diarrhea, nausea and vomiting.  Genitourinary: Negative for dysuria.  Neurological: Negative for dizziness and syncope.  Psychiatric/Behavioral: Negative for confusion.    Vital Signs: BP (!) 166/79   Pulse 60   Temp 98.5 F (36.9 C) (Oral)   Resp 16   Ht _0  (1.575 m)   Wt 108 lb (49 kg)   SpO2 98%   BMI 19.75 kg/m   Physical Exam  Constitutional: She is oriented to person, place, and time. No distress.  Husband at bedside during exam  HENT:  Head: Normocephalic.  Cardiovascular: Normal rate, regular rhythm and normal heart sounds.  Pulmonary/Chest: Effort normal and breath sounds normal.  Abdominal: Soft. She exhibits no distension. There is no tenderness.  Increased bowel sounds  Neurological: She is alert and oriented to person, place, and time.  Skin: Skin is warm and dry. She is not diaphoretic.  Psychiatric: She has a normal mood and affect. Her behavior is normal. Judgment and thought content normal.  Vitals reviewed.    MD Evaluation Airway: WNL Heart: WNL Abdomen: WNL Chest/ Lungs: WNL ASA  Classification: 3 Mallampati/Airway Score: One   Imaging: Ct Abdomen Pelvis W Contrast  Result Date: 12/01/2017 CLINICAL DATA:  Abdominal pain. Nausea. Vomiting. Constipation. History of pancreatic cancer and hypermetabolic focal sigmoid colon wall thickening. EXAM: CT ABDOMEN AND PELVIS WITH CONTRAST TECHNIQUE: Multidetector CT imaging of the abdomen and pelvis was performed using the standard protocol following bolus administration of intravenous contrast. CONTRAST:  152m OMNIPAQUE IOHEXOL 300 MG/ML  SOLN COMPARISON:  11/26/2017 PET-CT.  11/13/2017 CT abdomen/pelvis. FINDINGS: Lower chest: Bandlike scarring versus atelectasis in the lingula. Coronary atherosclerosis. Hepatobiliary: Normal liver size. Scattered granulomatous liver calcifications are stable. No liver masses. Cholecystectomy. Bile ducts are not convincingly changed and are within expected  post cholecystectomy limits with CBD diameter 9 mm with smooth distal tapering. Pancreas: Poorly marginated hypoenhancing 4.8 x 3.1 cm pancreatic body mass, not appreciably changed from 11/13/2017 CT using similar measurement technique. Stable atrophy and duct dilation in the pancreatic tail. Narrowing of the portosplenic venous confluence by the mass appears slightly worsened. Spleen: Normal size spleen. Scattered subcentimeter hypodense splenic lesions are stable. No new splenic lesion. Adrenals/Urinary Tract: Stable adrenal glands without  discrete adrenal nodules. Stable mild fullness of the right renal collecting system without overt hydronephrosis. No left hydronephrosis. Symmetric normal contrast nephrograms. Small simple left renal cysts, largest 1.8 cm in the lower left kidney. Additional subcentimeter hypodense renal cortical lesions in the left kidney are too small to characterize and are unchanged. No new renal lesions. Normal mildly distended bladder. Stomach/Bowel: Small hiatal hernia. Otherwise normal nondistended stomach. Normal caliber small bowel with no small bowel wall thickening. Normal appendix. Oral contrast transits to the splenic flexure of the colon. Irregular annular wall thickening in the distal sigmoid colon is not convincingly changed. There is moderate stool and mild colonic dilatation in the left colon proximal to the sigmoid wall thickening. Moderate sigmoid diverticulosis. There is new circumferential wall thickening and pericolonic fat stranding in the descending colon. Vascular/Lymphatic: Atherosclerotic nonaneurysmal abdominal aorta. Patent hepatic and renal veins. No pathologically enlarged lymph nodes in the abdomen or pelvis. Reproductive: Status post hysterectomy, with no abnormal findings at the vaginal cuff. No adnexal mass. Other: No pneumoperitoneum, ascites or focal fluid collection. Stable small fat containing periumbilical hernia. Clustered mid omental nodularity  extending into the periumbilical hernia, with dominant 1.1 cm solid omental nodule, increased from 0.6 cm on 11/13/2017 CT. New subcentimeter solid left omental nodules (series 3/image 43). Musculoskeletal: No aggressive appearing focal osseous lesions. Moderate thoracolumbar spondylosis. IMPRESSION: 1. No change in irregular annular distal sigmoid colon wall thickening, cannot exclude primary sigmoid neoplasm. 2. No evidence of small-bowel obstruction. Moderate stool throughout the left colon above the site of sigmoid colon wall thickening, with new wall thickening and pericolonic fat stranding in the descending colon. Findings suggest a nonspecific descending colitis, potentially stercoral colitis due to fecal stasis. No free air or abscess. 3. Solid omental nodules are increased in size and number since 11/13/2017 CT, most compatible with progressive peritoneal carcinomatosis. 4. Poorly marginated hypoenhancing pancreatic body mass compatible with primary pancreatic adenocarcinoma, not appreciably changed in size. Worsening narrowing of the portosplenic venous confluence by the pancreatic mass. 5.  Aortic Atherosclerosis (ICD10-I70.0). Electronically Signed   By: Ilona Sorrel M.D.   On: 12/01/2017 19:04   Ct Abdomen Pelvis W Contrast  Result Date: 11/13/2017 CLINICAL DATA:  85 year old female with abnormal weight loss over the past 4 months. History of uterine cancer 2004 post hysterectomy. Prior cholecystectomy. Initial encounter. EXAM: CT ABDOMEN AND PELVIS WITH CONTRAST TECHNIQUE: Multidetector CT imaging of the abdomen and pelvis was performed using the standard protocol following bolus administration of intravenous contrast. CONTRAST:  178m ISOVUE-300 IOPAMIDOL (ISOVUE-300) INJECTION 61% COMPARISON:  02/04/2016 CT. FINDINGS: Lower chest: No worrisome lung base abnormality. Mild cardiomegaly. Prominent coronary artery calcification. Hepatobiliary: Top-normal size fatty liver. Left intrahepatic biliary  duct and common bile duct prominence as noted on prior exam. Post cholecystectomy. Subcentimeter low-density structure inferior aspect right lobe liver unchanged from prior exam. Nonspecific 1.2 cm low-density anterior aspect left lobe liver (series 2, image 22) unchanged. Scattered liver calcifications. Pancreas: New from the prior examination is a 4 x 3.2 x 3.3 cm pancreatic body mass which is causing obstruction and subsequent dilation of the pancreatic duct in the region of pancreatic tail. This is highly suspicious for pancreatic malignancy. This impresses upon the splenic vein, superior aspect of the superior mesenteric vein and confluence of the portal vein. This partially surrounds the common hepatic artery. Extension towards the posterior gastric wall which may be involved. Spleen: Increase in number of low-density lesions within the spleen. Not all can be confirmed as simple cysts.  This would be an unusual configuration for metastatic disease which cannot be entirely excluded. Adrenals/Urinary Tract: Left-sided renal low-density lesions some which are cysts and others too small to adequately characterize. No obstructing stone or hydronephrosis. Noncontrast filled views of the urinary bladder unremarkable. No adrenal lesion. Stomach/Bowel: Abnormal appearance of the mid sigmoid colon. Two walls are apposed with masslike projection between the 2 loops. This is in the setting of diverticulosis (series 2 image 60). Vascular/Lymphatic: Significant atherosclerotic changes aorta and aortic branch vessels. Slight ectasia aorta without aneurysm. No large vessel occlusion. Scattered normal size lymph nodes. Reproductive: Post hysterectomy.  No worrisome adnexal mass. Other: No free air. Anterior abdominal wall periumbilical hernia containing fat. Additionally, 3 nodular density seen within the hernia measuring up to 1.6 cm new from prior CT which in the present clinical setting is suspicious for spread of tumor.  Musculoskeletal: Scoliosis and degenerative changes lower thoracic and lumbar spine without osseous destructive lesion. IMPRESSION: 1. There are new findings since prior exam with the 2 dominant findings involving the pancreatic body and sigmoid colon as discussed above and below. 2. New from the prior examination is a 4 x 3.2 x 3.3 cm pancreatic body mass which is causing obstruction and subsequent dilation of the pancreatic duct in the region of pancreatic tail. This is highly suspicious for pancreatic malignancy. This impresses upon the splenic vein, superior aspect of the superior mesenteric vein and confluence of the portal vein. This partially surrounds the common hepatic artery. Extension towards the posterior gastric wall which may be involved. Given the significant growth in a relatively short period time, inflammatory process can be considered but is felt to be less likely consideration. 3. Abnormal appearance of the mid sigmoid colon. Two walls are apposed with masslike projection between the 2 loops. This is in the setting of diverticulosis (series 2 image 60). Question result of muscular hypertrophy, diverticulitis, primary sigmoid mass and less likely metastatic disease. 4. 3 nodular density seen within anterior abdominal wall hernia measuring up to 1.6 cm are new from prior CT which in the present clinical setting is suspicious for spread of tumor. 5. Increase in number of low-density lesions within the spleen. Not all can be confirmed as simple cysts. This would be an unusual configuration for metastatic disease which cannot be entirely excluded. 6. Fatty liver with intrahepatic biliary duct dilation, tiny low-density lesions and calcifications similar to prior exam. 7. Aortic Atherosclerosis (ICD10-I70.0). These results were called by telephone at the time of interpretation on 11/13/2017 at 4:54 pm to Dr. Elsie Stain , who verbally acknowledged these results. Electronically Signed   By: Genia Del M.D.   On: 11/13/2017 17:27   Nm Pet Image Initial (pi) Skull Base To Thigh  Result Date: 11/26/2017 CLINICAL DATA:  Initial treatment strategy for pancreatic cancer. EXAM: NUCLEAR MEDICINE PET SKULL BASE TO THIGH TECHNIQUE: 6.0 mCi F-18 FDG was injected intravenously. Full-ring PET imaging was performed from the skull base to thigh after the radiotracer. CT data was obtained and used for attenuation correction and anatomic localization. Fasting blood glucose: 133 mg/dl COMPARISON:  CT abdomen 11/13/2017 FINDINGS: Mediastinal blood pool activity: SUV max 2.2 NECK: No significant abnormal hypermetabolic activity in this region. Incidental CT findings: Left ocular prosthesis. Common carotid artery atherosclerotic calcification. Remote fracture the left maxilla. CHEST: Focal right hilar activity with maximum SUV 4.3. Left hilar activity, maximum SUV 4.0. There is a band of volume loss and density in the lingula which may have a small  nodule along its upper margin measuring 0.9 by 0.5 cm on image 108/3. The band of density measures 3.8 by 1.1 cm. This has low-grade accentuated metabolic activity, maximum SUV 3.2. There is a cluster of small nodules posteriorly in the right upper lobe as shown for example on image 93/3. One of the larger nodules measures 0.9 by 0.4 cm on image 91/3. This has a maximum SUV of 1.8. Incidental CT findings: Localized cylindrical bronchiectasis in the left upper lobe. Coronary, aortic arch, and branch vessel atherosclerotic vascular disease. Mild-to-moderate cardiomegaly. ABDOMEN/PELVIS: The hypoenhancing mass of the pancreatic body and tail has maximum SUV of 5.6, with abnormal metabolic activity measuring roughly 5.0 by 2.7 cm. The dorsal pancreatic duct distal to this mass is dilated. There is a region of concern for sigmoid colon mass on prior CT; on today's PET-CT this thickened portion of the sigmoid colon is hypermetabolic with a maximum SUV of 7.4, and adjacent mild  nodularity suspicious for local nodal involvement, including a 7 mm in short axis lymph node on image 221/3 and an 8 mm perirectal node eccentric to the left on image 227/3. There are 4 hypermetabolic nodules along the omentum. Two of these are in a small infraumbilical hernia, with the larger measuring 1.4 by 0.8 cm on image 205/3, with maximum SUV 3.9. The other adjacent omental nodules are of similar size and activity level. There is also some mild nodularity along a small left inguinal hernia which appears increased from 02/04/2016, maximum SUV in this vicinity 2.8 Focal activity along the vaginal vestibule, maximum SUV 20.1, probably from urinary incontinence. Incidental CT findings: Calcifications in the dome of the right hepatic lobe, likely postinflammatory. Cholecystectomy. Aortoiliac atherosclerotic vascular disease. Photopenic lesion of the left kidney lower pole favoring cyst. Chronic mural thrombus in a mildly ectatic infrarenal abdominal aorta. Prominent stool throughout the colon favors constipation. Scattered sigmoid colon diverticula. SKELETON: No significant abnormal hypermetabolic activity in this region. Incidental CT findings: Old left facial fractures. IMPRESSION: 1. Approximately 5 cm hypermetabolic mass in the pancreatic body and tail, maximum SUV 5.6, with morphologic findings suspicious for pancreatic adenocarcinoma. 2. However, there is also a hypermetabolic mass in the sigmoid colon with adjacent paracolic adenopathy. This mass has a maximum SUV of 7.4. A synchronous colon cancer is a distinct possibility. 3. Abnormal hypermetabolic nodules in the omentum, both in and adjacent to a small infraumbilical hernia, suspicious for metastatic disease. 4. Mildly hypermetabolic bilateral hilar lymph nodes. There is also some low-grade accentuated metabolic activity along a region of lingular atelectasis with some mild adjacent nodularity, as well as clustered nodularity posteriorly in the right  upper lobe. I am not completely convinced that the thoracic findings represent malignancy; atypical infectious bronchiolitis with reactive lymph nodes might have a similar appearance, but this certainly deserves surveillance. Also consider a baseline CT of the chest with contrast. 5. Other imaging findings of potential clinical significance: Aortic Atherosclerosis (ICD10-I70.0). Coronary atherosclerosis with mild-to-moderate cardiomegaly. Prominent stool throughout the colon favors constipation. Old left facial fractures. Electronically Signed   By: Van Clines M.D.   On: 11/26/2017 16:19    Labs:  CBC: Recent Labs    11/27/17 1050 12/01/17 1557 12/03/17 0513 12/06/17 0620  WBC 6.7 9.7 6.3 6.1  HGB 13.3 14.0 12.5 13.4  HCT 42.0 44.6 39.5 42.7  PLT 288 286 222 317    COAGS: Recent Labs    12/06/17 0620  INR 1.02  APTT 32    BMP: Recent Labs  11/27/17 1050 12/01/17 1557 12/03/17 0513 12/04/17 0820  NA 141 137 138 139  K 3.4* 3.5 2.6* 3.8  CL 103 100 103 106  CO2 _0 21*  GLUCOSE 204* 153* 123* 125*  BUN 13 13 7* 7*  CALCIUM 9.5 9.2 8.6* 8.9  CREATININE 0.76 0.67 0.65 0.58  GFRNONAA >60 >60 >60 >60  GFRAA >60 >60 >60 >60    LIVER FUNCTION TESTS: Recent Labs    07/04/17 1443 08/13/17 1223 11/09/17 1532 11/27/17 1050 12/01/17 1557  BILITOT 0.2 0.5 0.5 0.6 0.8  AST _1 ALT _2 ALKPHOS 76 54  --  83 76  PROT 6.7 6.7 6.6 6.9 6.7  ALBUMIN 3.5 3.8  --  3.4* 3.4*    TUMOR MARKERS: No results for input(s): AFPTM, CEA, CA199, CHROMGRNA in the last 8760 hours.  Assessment and Plan:  Patient with recently discovered pancreatic and sigmoid masses as well as hypermetabolic peritoneal nodules on PET scan from 11/25. She is followed by Dr. Burr Medico who requests a biopsy of these peritoneal nodules.   Patient has been NPO since 7 pm yesterday, she did not take any medications this morning, she does not take blood thinning medications  at home although she does report receiving Lovenox while in the hospital - last dose 12/3. She is afebrile, WBC 6.1, hgb 13.4, INR 1.02.   Risks and benefits discussed with the patient including, but not limited to bleeding, infection, damage to adjacent structures or low yield requiring additional tests.  All of the patient's questions were answered, patient is agreeable to proceed.  Consent signed and in chart.  Thank you for this interesting consult.  I greatly enjoyed meeting [facility] and look forward to participating in their care.  A copy of this report was sent to the requesting provider on this date.  Electronically Signed: Joaquim Nam, PA-C 12/06/2017, 7:30 AM   I spent a total of  30 Minutes in face to face in clinical consultation, greater than 50% of which was counseling/coordinating care for peritoneal nodule biopsy.

## 2017-12-07 NOTE — Progress Notes (Signed)
  Oncology Nurse Navigator Documentation     Called patient to introduce role of GI Navigator. Patient has multiple family members and friends who are supportive. Mrs. Gadsby lives with her husband of 57 years and has two children who are a source of support. Patient states that she does not have any questions or concerns at this point. No navigation needs identified. Pt encouraged to call with questions or concerns. Will continue to follow as needed.

## 2017-12-10 ENCOUNTER — Encounter: Payer: PPO | Admitting: Nutrition

## 2017-12-10 ENCOUNTER — Telehealth: Payer: Self-pay

## 2017-12-10 NOTE — Telephone Encounter (Signed)
-----   Message from Milus Banister, MD sent at 12/08/2017 11:27 AM EST ----- Ok, thanks  [place], See below. Do not make any changes in the schedule yet. Awaiting further pathology testing.  Thanks  ----- Message ----- From: Truitt Merle, MD Sent: 12/08/2017  10:16 AM EST To: Milus Banister, MD  Linna Hoff,   Thanks for f/u. I will talk to the pathologist and make sure he know pt may have two primaries (pancrase and colon) and request IHC studies to make sure the peritoneal nodule is metastasis from pancrease, not from colon. I will keep you posted.  [name]  ----- Message ----- From: Milus Banister, MD Sent: 12/08/2017   5:09 AM EST To: Ladene Artist, MD, Timothy Lasso, RN, #  [name], I noticed that her peritoneal nodule biopsy was positive for adenocarcinoma and so there is probably no need for EUS FNA of the primary site in pancreas.  I'll still plan on colonoscopy on 12/12 but we'll cancel the EUS for that day.  [place], [place].  Zac Torti, See above.  She still needs the colonoscopy but you can cancel the EUS portion of the exam.  Can you please offer the 12/12 spot to Ms. Hermenia Fiscal (Dr. [name] patient with new pancreatic mass, currently scheduled for EUS on 12/26).

## 2017-12-11 ENCOUNTER — Telehealth: Payer: Self-pay | Admitting: Gastroenterology

## 2017-12-11 DIAGNOSIS — R933 Abnormal findings on diagnostic imaging of other parts of digestive tract: Secondary | ICD-10-CM

## 2017-12-11 DIAGNOSIS — K8689 Other specified diseases of pancreas: Secondary | ICD-10-CM

## 2017-12-11 MED ORDER — NA SULFATE-K SULFATE-MG SULF 17.5-3.13-1.6 GM/177ML PO SOLN: ORAL | 0 refills | Status: DC

## 2017-12-11 NOTE — Telephone Encounter (Signed)
Spoke with wife then [place] with husband, he said the suprep RX was not at the pharmacy. Resent Suprep [facility] to [facility] per his request.

## 2017-12-12 ENCOUNTER — Telehealth: Payer: Self-pay

## 2017-12-12 ENCOUNTER — Telehealth: Payer: Self-pay | Admitting: Hematology

## 2017-12-12 NOTE — Telephone Encounter (Signed)
Scheduled appt per 12/10 sch message - pt is aware of appt date and time   

## 2017-12-12 NOTE — Telephone Encounter (Signed)
-----   Message from Milus Banister, MD sent at 12/12/2017  5:41 AM EST ----- OK, thanks for the information.   Molly [name], Can you let her know that Dr. Burr Medico and I agree that she does NOT need the upper EUS portion of her exam tomorrow. Will still plan on colonoscopy.  Can you please offer the time to Molly [name] (currently scheduled for 12/26).  Let me know if Molly [name] does NOT want to take the spot, there are a few others the spot in that case.  Thanks  dj ----- Message ----- From: Truitt Merle, MD Sent: 12/11/2017   4:23 PM EST To: Milus Banister, MD, Timothy Lasso, RN  Dan  [facility] supports pancreatic primary. OK to cancel her EUS. I will let pt know also.  Thanks  [name]    ----- Message ----- From: Milus Banister, MD Sent: 12/08/2017  11:27 AM EST To: Timothy Lasso, RN, Truitt Merle, MD  [facility], thanks  [place], See below. Do not make any changes in the schedule yet. Awaiting further pathology testing.  Thanks  ----- Message ----- From: Truitt Merle, MD Sent: 12/08/2017  10:16 AM EST To: Milus Banister, MD  Linna Hoff,   Thanks for f/u. I will talk to the pathologist and make sure he know pt may have two primaries (pancrase and colon) and request IHC studies to make sure the peritoneal nodule is metastasis from pancrease, not from colon. I will keep you posted.  [name]  ----- Message ----- From: Milus Banister, MD Sent: 12/08/2017   5:09 AM EST To: Ladene Artist, MD, Timothy Lasso, RN, #  [name], I noticed that her peritoneal nodule biopsy was positive for adenocarcinoma and so there is probably no need for EUS FNA of the primary site in pancreas.  I'll still plan on colonoscopy on 12/12 but we'll cancel the EUS for that day.  [place], [place].  [name], See above.  She still needs the colonoscopy but you can cancel the EUS portion of the exam.  Can you please offer the 12/12 spot to Molly [name] (Dr. [name] patient with new pancreatic mass, currently scheduled for EUS on 12/26).

## 2017-12-12 NOTE — Telephone Encounter (Signed)
I have cancelled the EUS the pt will be notified via Dr Burr Medico.

## 2017-12-12 NOTE — Progress Notes (Signed)
[facility]   [phone] [phone]   Clinic Follow up Note   Patient Care Team: Tonia Ghent, MD as PCP - General (Family Medicine) Shon Hough, MD as Consulting Physician (Ophthalmology) Bond, Tracie Harrier, MD as Referring Physician (Ophthalmology) Jovita Gamma, MD as Consulting Physician (Neurosurgery) Rolm Bookbinder, MD as Consulting Physician (Dermatology) Eula Listen, DDS as Referring Physician (Dentistry) Ladene Artist, MD as Consulting Physician (Gastroenterology)  Date of Service:  12/14/2017  CHIEF COMPLAINT: Metastatic Pancreatic Cancer   Oncology History   Cancer Staging Pancreatic cancer [facility]) Staging form: Exocrine Pancreas, AJCC 8th Edition - Clinical stage from 12/06/2017: Stage IV (cT3, cN0, pM1) - Signed by Truitt Merle, MD on 12/14/2017       Pancreatic cancer ([place])   11/13/2017 Imaging    CT AP W Contrast 11/13/17  IMPRESSION: 1. There are new findings since prior exam with the 2 dominant findings involving the pancreatic body and sigmoid colon as discussed above and below. 2. New from the prior examination is a 4 x 3.2 x 3.3 cm pancreatic body mass which is causing obstruction and subsequent dilation of the pancreatic duct in the region of pancreatic tail. This is highly suspicious for pancreatic malignancy. This impresses upon the splenic vein, superior aspect of the superior mesenteric vein and confluence of the portal vein. This partially surrounds the common hepatic artery. Extension towards the posterior gastric wall which may be involved. Given the significant growth in a relatively short period time, inflammatory process can be considered but is felt to be less likely consideration. 3. Abnormal appearance of the mid sigmoid colon. Two walls are apposed with masslike projection between the 2 loops. This is in the setting of diverticulosis (series 2 image 60). Question result of muscular  hypertrophy, diverticulitis, primary sigmoid mass and less likely metastatic disease. 4. 3 nodular density seen within anterior abdominal wall hernia measuring up to 1.6 cm are new from prior CT which in the present clinical setting is suspicious for spread of tumor. 5. Increase in number of low-density lesions within the spleen. Not all can be confirmed as simple cysts. This would be an unusual configuration for metastatic disease which cannot be entirely excluded. 6. Fatty liver with intrahepatic biliary duct dilation, tiny low-density lesions and calcifications similar to prior exam. 7. Aortic Atherosclerosis (ICD10-I70.0).     11/26/2017 PET scan    PET 11/26/17  IMPRESSION: 1. Approximately 5 cm hypermetabolic mass in the pancreatic body and tail, maximum SUV 5.6, with morphologic findings suspicious for pancreatic adenocarcinoma. 2. However, there is also a hypermetabolic mass in the sigmoid colon with adjacent paracolic adenopathy. This mass has a maximum SUV of 7.4. A synchronous colon cancer is a distinct possibility. 3. Abnormal hypermetabolic nodules in the omentum, both in and adjacent to a small infraumbilical hernia, suspicious for metastatic disease. 4. Mildly hypermetabolic bilateral hilar lymph nodes. There is also some low-grade accentuated metabolic activity along a region of lingular atelectasis with some mild adjacent nodularity, as well as clustered nodularity posteriorly in the right upper lobe. I am not completely convinced that the thoracic findings represent malignancy; atypical infectious bronchiolitis with reactive lymph nodes might have a similar appearance, but this certainly deserves surveillance. Also consider a baseline CT of the chest with contrast. 5. Other imaging findings of potential clinical significance: Aortic Atherosclerosis (ICD10-I70.0). Coronary atherosclerosis with mild-to-moderate cardiomegaly. Prominent stool throughout the  colon favors constipation. Old left facial fractures.    12/01/2017 Imaging  CT AP W Contrast 12/01/17  IMPRESSION: 1. No change in irregular annular distal sigmoid colon wall thickening, cannot exclude primary sigmoid neoplasm. 2. No evidence of small-bowel obstruction. Moderate stool throughout the left colon above the site of sigmoid colon wall thickening, with new wall thickening and pericolonic fat stranding in the descending colon. Findings suggest a nonspecific descending colitis, potentially stercoral colitis due to fecal stasis. No free air or abscess. 3. Solid omental nodules are increased in size and number since 11/13/2017 CT, most compatible with progressive peritoneal carcinomatosis. 4. Poorly marginated hypoenhancing pancreatic body mass compatible with primary pancreatic adenocarcinoma, not appreciably changed in size. Worsening narrowing of the portosplenic venous confluence by the pancreatic mass. 5.  Aortic Atherosclerosis (ICD10-I70.0).     12/06/2017 Cancer Staging    Staging form: Exocrine Pancreas, AJCC 8th Edition - Clinical stage from 12/06/2017: Stage IV (cT3, cN0, pM1) - Signed by Truitt Merle, MD on 12/14/2017    12/06/2017 Initial Biopsy    Diagnosis Soft Tissue Needle Core Biopsy, peritoneal - METASTATIC ADENOCARCINOMA, CONSISTENT WITH CLINICAL HISTORY OF PRIMARY PANCREATIC MASS. Immunostains show that the tumor cells are CK7 and CDX2 positive; and CK20 negative, consistent with the interpretation of an upper GI and pancreatobiliary primary.    12/14/2017 Initial Diagnosis    Pancreatic cancer [facility])       CURRENT THERAPY:  PENDING   INTERVAL HISTORY:  Molly [name] is here for a follow up and discuss treatment plan. She presents to the clinic today accompanied by her family. She had a colonoscopy yesterday and could not pass the scope due to an obstruction in from outside sigmoid cancer. She denies having pain through procedure. She notes  her bowel movements have been erratic. She does not think she has complete prep as she vomited her prep liquid. She tried Gatorade and Miralax which helped.   Her daughter notes she did present to ED because she could not pass bowels. She has been taking Miralax. She is overwhelmed and she and her family are emotional with her next steps and overall prognosis. She is not inclined to proceed with chemotherapy but will think about it.     REVIEW OF SYSTEMS:   Constitutional: Denies fevers, chills or abnormal weight loss (+) fatigue  Eyes: Denies blurriness of vision Ears, nose, mouth, throat, and face: Denies mucositis or sore throat Respiratory: Denies cough, dyspnea or wheezes Cardiovascular: Denies palpitation, chest discomfort or lower extremity swelling Gastrointestinal:  Denies nausea, heartburn (+) constipation Skin: Denies abnormal skin rashes Lymphatics: Denies new lymphadenopathy or easy bruising Neurological:Denies numbness, tingling or new weaknesses Behavioral/Psych: Mood is stable, no new changes  All other systems were reviewed with the patient and are negative.  MEDICAL HISTORY:  Past Medical History:  Diagnosis Date  . Cataract    removed both eyes  . Diabetes mellitus without complication ([place]) 01/11/4172   diet controlled   . Diverticulosis of colon   . GERD (gastroesophageal reflux disease)   . Glaucoma   . Hyperlipidemia   . Hypertension   . Kidney stones   . MALIGNANT NEOPLASM OF UTERUS PART UNSPECIFIED 1998  . Pancreatic cancer ([place])   . Thyroid disease    hypothyroidism    SURGICAL HISTORY: Past Surgical History:  Procedure Laterality Date  . ABDOMINAL HYSTERECTOMY  12/22/1996   Uterine Cancer Dr Phineas Real  . BREAST BIOPSY  1996   Benign  . BREAST EXCISIONAL BIOPSY Left   . CHOLECYSTECTOMY    . COLONOSCOPY    .  COLONOSCOPY WITH PROPOFOL N/A 12/13/2017   Procedure: COLONOSCOPY WITH PROPOFOL;  Surgeon: Milus Banister, MD;  Location: [facility];   Service: Endoscopy;  Laterality: N/A;  . Detached Retina Repair OS    . FACIAL LACERATION REPAIR Left 01/20/2015   Procedure: FACIAL LACERATION REPAIR;  Surgeon: Danice Goltz, MD;  Location: [facility];  Service: Ophthalmology;  Laterality: Left;  . Release R Long Finger A1 Pulley  01/23/2008   Dr Daylene Katayama  . RUPTURED GLOBE EXPLORATION AND REPAIR Left 01/20/2015   Procedure: REPAIR OF RUPTURED GLOBE and FACIAL LACERATIONS;  Surgeon: Danice Goltz, MD;  Location: [place];  Service: Ophthalmology;  Laterality: Left;  . UPPER GASTROINTESTINAL ENDOSCOPY      I have reviewed the social history and family history with the patient and they are unchanged from previous note.  ALLERGIES:  is allergic to erythromycin; ezetimibe-simvastatin; latex; macrodantin [nitrofurantoin macrocrystal]; metformin and related; morphine; other; petroleum jelly [skin protectants, misc.]; preparation h [phenyleph-shark liv oil-mo-pet]; procaine hcl; simvastatin; sulfamethoxazole; white petrolatum; adhesive [tape]; and tape.  MEDICATIONS:  Current Outpatient Medications  Medication Sig Dispense Refill  . acetaminophen (TYLENOL) 500 MG tablet Take 1 tablet (500 mg total) by mouth every 6 (six) hours as needed for mild pain or headache. 30 tablet 0  . atenolol (TENORMIN) 25 MG tablet Take 1 tablet (76m) by mouth daily 90 tablet 1  . Biotin 10 MG CAPS Take 1 capsule by mouth daily.     . Black Cohosh-SoyIsoflav-Magnol (ESTROVEN MENOPAUSE RELIEF) CAPS Take by mouth.    . Blood Glucose Monitoring Suppl ([place] w/Device KIT Use to check blood sugar once daily and as needed.  Non insulin dependent  E11.9 1 kit 0  . brimonidine (ALPHAGAN P) 0.1 % SOLN Apply 1 drop to eye 2 (two) times daily.    . Cholecalciferol ([employer] VITAMIN D) 2000 UNITS CAPS Take 1 capsule (2,000 Units total) by mouth daily.    . Coenzyme Q10 (COQ10 PO) Take 1 tablet by mouth daily.     . dorzolamide-timolol (COSOPT) 22.3-6.8  MG/ML ophthalmic solution Place 1 drop into the right eye 2 (two) times daily.     .Marland Kitchenglucose blood (ONETOUCH VERIO) test strip Use as instructed to check blood sugar once daily and as needed.  Non insulin dependent.  E11.9 100 each 1  . latanoprost (XALATAN) 0.005 % ophthalmic solution Place 1 drop into the right eye at bedtime.     .Marland Kitchenlevothyroxine (SYNTHROID, LEVOTHROID) 88 MCG tablet Take 1 tablet by mouth daily 90 tablet 1  . lidocaine (XYLOCAINE) 5 % ointment APPLY A SMALL AMOUNT TO RECTUM 3-4 TIMES PER DAY AS NEEDED 35.44 g 1  . Multiple Vitamin (MULTIVITAMIN) tablet Take 1 tablet by mouth daily.      . Na Sulfate-K Sulfate-Mg Sulf 17.5-3.13-1.6 GM/177ML SOLN Suprep (no substitutions)-TAKE AS DIRECTED. 354 mL 0  . ONETOUCH DELICA LANCETS 349QMISC Use to check blood sugar once daily and as needed.  Non insulin dependent.  E11.9 100 each 1  . polyethylene glycol (MIRALAX / GLYCOLAX) packet Take 17 g by mouth daily. 14 each 0   No current facility-administered medications for this visit.     PHYSICAL EXAMINATION: ECOG PERFORMANCE STATUS: 1 - Symptomatic but completely ambulatory  Vitals:   12/14/17 1502 12/14/17 1510  BP: (!) 140/95 (!) 153/74  Pulse: 69   Resp: 17   Temp: (!) 97.5 F (36.4 C)   SpO2: 94%    Filed Weights  12/14/17 1502  Weight: 106 lb 8 oz (48.3 kg)    GENERAL:alert, no distress and comfortable SKIN: skin color, texture, turgor are normal, no rashes or significant lesions EYES: normal, Conjunctiva are pink and non-injected, sclera clear OROPHARYNX:no exudate, no erythema and lips, buccal mucosa, and tongue normal  NECK: supple, thyroid normal size, non-tender, without nodularity LYMPH:  no palpable lymphadenopathy in the cervical, axillary or inguinal LUNGS: clear to auscultation and percussion with normal breathing effort HEART: regular rate & rhythm and no murmurs and no lower extremity edema ABDOMEN:abdomen soft, non-tender and normal bowel  sounds Musculoskeletal:no cyanosis of digits and no clubbing  NEURO: alert & oriented x 3 with fluent speech, no focal motor/sensory deficits  LABORATORY DATA:  I have reviewed the data as listed CBC Latest Ref Rng & Units 12/06/2017 12/03/2017 12/01/2017  WBC 4.0 - 10.5 K/uL 6.1 6.3 9.7  Hemoglobin 12.0 - 15.0 g/dL 13.4 12.5 14.0  Hematocrit 36.0 - 46.0 % 42.7 39.5 44.6  Platelets 150 - 400 K/uL 317 222 286     CMP Latest Ref Rng & Units 12/04/2017 12/03/2017 12/01/2017  Glucose 70 - 99 mg/dL 125(H) 123(H) 153(H)  BUN 8 - 23 mg/dL 7(L) 7(L) 13  Creatinine 0.44 - 1.00 mg/dL 0.58 0.65 0.67  Sodium 135 - 145 mmol/L 139 138 137  Potassium 3.5 - 5.1 mmol/L 3.8 2.6(LL) 3.5  Chloride 98 - 111 mmol/L 106 103 100  CO2 22 - 32 mmol/L 21(L) 26 26  Calcium 8.9 - 10.3 mg/dL 8.9 8.6(L) 9.2  Total Protein 6.5 - 8.1 g/dL - - 6.7  Total Bilirubin 0.3 - 1.2 mg/dL - - 0.8  Alkaline Phos 38 - 126 U/L - - 76  AST 15 - 41 U/L - - 26  ALT 0 - 44 U/L - - 20     PROCEDURES  Colonoscopy by Dr. Ardis Hughs 12/13/17  IMPRESSION - Severe stenosis of the sigmoid colon without evident mucosal neoplasm at the distal end of the stricture I was unable to advance the colonoscope or a adult gastroscope through the stenosis. This MAY be from a primary colon cancer however I feel it is more likely related to the known metastatic pancreatic cancer (extrinsically compressing the sigmoid   RADIOGRAPHIC STUDIES: I have personally reviewed the radiological images as listed and agreed with the findings in the report. No results found.   ASSESSMENT & PLAN:  SYMIA HERDT is a 84 y.o. female with   1. Pancreatic cancer, metastatic to peritoneum, stage IV  -I reviewed and discussed her recent peritoneal nodule biopsy pathology and coloscopy needs.  -Her peritoneal biopsy results were consistent with primary pancreatic cancer. She is now staged IV and her cancer is no longer curable. Her cancer is still treatable.   Goal of therapy is prolong her life and preserve her quality of life. -We discussed treatment options.  Due to her metastatic disease, surgery is not an option.  She does not have significant pain, no need palliative radiation or celiac block.  Follow-up for treatment option is chemotherapy. Given her age, she is not a candidate for combined chemotherapy, such as FOLFIRINOX, or gemcitabine and Abraxane.  Her performance status is 1-2, so single agent weekly Gemcitabine 2 weeks on, 1 week off is still an option. I explained the benefit from this single agent is limited and response rate is less than 10%. I discussed side effects including fatigue primarily, nausea, vomiting, and low blood counts. I have low threshold to stop chemo  if she has poor tolerable. -I will request MMR and FO on her biopsy to see if she is a candidate for immunotherapy or targeted therapy.  -I will also refer her to genetics to see if she has BRCA1 or BRCA2 mutation, for which PARP inhibitor is available  -Given her very advanced age, I think palliative care alone is also very reasonable, with supportive care and hospice care to manage her cancer symptoms. -After lengthy discussion, patient expressed her wishes of conservative management, and not to pursue chemotherapy.  But she is open to genetic testing, and palliative procedure or surgery to improve her quality of life. Her husband encouraged herr to try chemo.    2. Bowel obstruction secondary to severe sigmoid colon stricture  -She was hospitalized on December 01, 2017 for symptoms of bowel obstruction, resolved after laxatives  -Due to the PET scan findings of a hypermetabolic sigmoid colon mass, she underwent colonoscopy yesterday by Dr. Ardis Hughs, which showed severe stricture in the sigmoid colon, scope was not able to pass.  Biopsy was not obtained.  Dr. Ardis Hughs feel the stricture was from external compression. I think this is probably from her peritoneal mets.  -Will discuss  with Dr. Ardis Hughs if he can try stent placement, if not, she may need diverting colostomy. Single agent Gemcitabine may not be active enough to shrink the mets and resolve the obstruction.  -I advised her to start a low fiber diet and eat well cooked food, soup or protein shakes.  -I will set up dietician consult.  -She is currently on Miralax, and has BM daily without much difficulty    3. DM, HTN, and Hypothyroidism -f/u with Dr. Damita Dunnings  4. Genetics -I will make urgent genetic referral for next week.   5. Goal of care discussion  -We again discussed the incurable nature of his/her cancer, and the overall poor prognosis, especially she has very limited cancer treatment options -The patient understands the goal of care is palliative. -I recommend DNR/DNI, she will think about it    Plan -Send urgent genetic referral  -Send [employer] referral  -I will discuss with Dr. Ardis Hughs and colorectal surgeons regarding her severe sigmoid colon stenosis -f/u open, after the above workup    No problem-specific Assessment & Plan notes found for this encounter.   No orders of the defined types were placed in this encounter.  All questions were answered. The patient knows to call the clinic with any problems, questions or concerns. No barriers to learning was detected.  I spent 30 minutes counseling the patient face to face. The total time spent in the appointment was 45 minutes and more than 50% was on counseling and review of test results     Truitt Merle, MD 12/14/2017   I, Joslyn Devon, am acting as scribe for Truitt Merle, MD.   I have reviewed the above documentation for accuracy and completeness, and I agree with the above.

## 2017-12-13 ENCOUNTER — Other Ambulatory Visit: Payer: Self-pay

## 2017-12-13 ENCOUNTER — Ambulatory Visit (HOSPITAL_COMMUNITY): Payer: PPO | Admitting: Anesthesiology

## 2017-12-13 ENCOUNTER — Encounter (HOSPITAL_COMMUNITY)
Admission: RE | Disposition: A | Discharge status: Home / Self Care | Payer: Self-pay | Source: Home / Self Care | Attending: Gastroenterology

## 2017-12-13 ENCOUNTER — Ambulatory Visit (HOSPITAL_COMMUNITY)
Admission: RE | Admit: 2017-12-13 | Discharge: 2017-12-13 | Disposition: A | Discharge status: Home / Self Care | Payer: PPO | Attending: Gastroenterology | Admitting: Gastroenterology

## 2017-12-13 ENCOUNTER — Encounter (HOSPITAL_COMMUNITY): Payer: Self-pay | Admitting: *Deleted

## 2017-12-13 DIAGNOSIS — K56609 Unspecified intestinal obstruction, unspecified as to partial versus complete obstruction: Secondary | ICD-10-CM | POA: Insufficient documentation

## 2017-12-13 DIAGNOSIS — Z888 Allergy status to other drugs, medicaments and biological substances status: Secondary | ICD-10-CM | POA: Diagnosis not present

## 2017-12-13 DIAGNOSIS — I10 Essential (primary) hypertension: Secondary | ICD-10-CM | POA: Insufficient documentation

## 2017-12-13 DIAGNOSIS — E119 Type 2 diabetes mellitus without complications: Secondary | ICD-10-CM | POA: Insufficient documentation

## 2017-12-13 DIAGNOSIS — K56699 Other intestinal obstruction unspecified as to partial versus complete obstruction: Secondary | ICD-10-CM

## 2017-12-13 DIAGNOSIS — Z882 Allergy status to sulfonamides status: Secondary | ICD-10-CM | POA: Insufficient documentation

## 2017-12-13 DIAGNOSIS — E039 Hypothyroidism, unspecified: Secondary | ICD-10-CM | POA: Insufficient documentation

## 2017-12-13 DIAGNOSIS — E782 Mixed hyperlipidemia: Secondary | ICD-10-CM | POA: Diagnosis not present

## 2017-12-13 DIAGNOSIS — Z881 Allergy status to other antibiotic agents status: Secondary | ICD-10-CM | POA: Diagnosis not present

## 2017-12-13 DIAGNOSIS — C7989 Secondary malignant neoplasm of other specified sites: Secondary | ICD-10-CM | POA: Diagnosis not present

## 2017-12-13 DIAGNOSIS — C259 Malignant neoplasm of pancreas, unspecified: Secondary | ICD-10-CM | POA: Insufficient documentation

## 2017-12-13 DIAGNOSIS — H409 Unspecified glaucoma: Secondary | ICD-10-CM | POA: Insufficient documentation

## 2017-12-13 DIAGNOSIS — R933 Abnormal findings on diagnostic imaging of other parts of digestive tract: Secondary | ICD-10-CM

## 2017-12-13 DIAGNOSIS — Z8542 Personal history of malignant neoplasm of other parts of uterus: Secondary | ICD-10-CM | POA: Diagnosis not present

## 2017-12-13 DIAGNOSIS — Z79899 Other long term (current) drug therapy: Secondary | ICD-10-CM | POA: Insufficient documentation

## 2017-12-13 DIAGNOSIS — Z79890 Hormone replacement therapy (postmenopausal): Secondary | ICD-10-CM | POA: Insufficient documentation

## 2017-12-13 DIAGNOSIS — K8689 Other specified diseases of pancreas: Secondary | ICD-10-CM

## 2017-12-13 HISTORY — PX: COLONOSCOPY WITH PROPOFOL: SHX5780

## 2017-12-13 SURGERY — COLONOSCOPY WITH PROPOFOL
Anesthesia: Monitor Anesthesia Care

## 2017-12-13 MED ORDER — LACTATED RINGERS IV SOLN
INTRAVENOUS | Status: DC
  Administered 2017-12-13: 1000 mL via INTRAVENOUS

## 2017-12-13 MED ORDER — SODIUM CHLORIDE 0.9 % IV SOLN: INTRAVENOUS | Status: DC

## 2017-12-13 MED ORDER — PROPOFOL 10 MG/ML IV BOLUS
INTRAVENOUS | Status: AC
  Filled 2017-12-13: qty 40

## 2017-12-13 MED ORDER — PROPOFOL 10 MG/ML IV BOLUS
INTRAVENOUS | Status: DC | PRN
  Administered 2017-12-13 (×2): 20 mg via INTRAVENOUS

## 2017-12-13 MED ORDER — LIDOCAINE 2% (20 MG/ML) 5 ML SYRINGE
INTRAMUSCULAR | Status: DC | PRN
  Administered 2017-12-13: 75 mg via INTRAVENOUS

## 2017-12-13 MED ORDER — PROPOFOL 500 MG/50ML IV EMUL
INTRAVENOUS | Status: DC | PRN
  Administered 2017-12-13: 100 ug/kg/min via INTRAVENOUS

## 2017-12-13 SURGICAL SUPPLY — 21 items

## 2017-12-13 NOTE — Anesthesia Preprocedure Evaluation (Signed)
Anesthesia Evaluation  Patient identified by MRN, date of birth, ID bandGeneral Assessment Comment:somnolent post narcotics  Reviewed: Allergy & Precautions, NPO status , Patient's Chart, lab work & pertinent test results  History of Anesthesia Complications Negative for: history of anesthetic complications  Airway Mallampati: III  TM Distance: <3 FB Neck ROM: Full    Dental  (+) Teeth Intact   Pulmonary neg pulmonary ROS,    breath sounds clear to auscultation       Cardiovascular hypertension, Pt. on medications and Pt. on home beta blockers (-) angina+ Peripheral Vascular Disease  (-) Past MI  Rhythm:Regular     Neuro/Psych Dementia negative psych ROS   GI/Hepatic Neg liver ROS, GERD  ,  Endo/Other  diabetesHypothyroidism   Renal/GU negative Renal ROS     Musculoskeletal   Abdominal   Peds  Hematology   Anesthesia Other Findings   Reproductive/Obstetrics                             Anesthesia Physical  Anesthesia Plan  ASA: III and emergent  Anesthesia Plan: MAC   Post-op Pain Management:    Induction: Intravenous  PONV Risk Score and Plan: 2 and Treatment may vary due to age or medical condition  Airway Management Planned: Nasal Cannula  Additional Equipment: None  Intra-op Plan:   Post-operative Plan:   Informed Consent: I have reviewed the patients History and Physical, chart, labs and discussed the procedure including the risks, benefits and alternatives for the proposed anesthesia with the patient or authorized representative who has indicated his/her understanding and acceptance.   Dental advisory given  Plan Discussed with: CRNA and Surgeon  Anesthesia Plan Comments:         Anesthesia Quick Evaluation

## 2017-12-13 NOTE — Op Note (Addendum)
[name] Patient Name: Molly [name] Procedure Date: 12/13/2017 MRN: [id number] Attending MD: Molly [name] , MD Date of Birth: 04/25/1929 CSN: [id number] Age: 84 Admit Type: Outpatient Procedure:                Colonoscopy Indications:              Abnormal CT, PET; recently proven metastatic                            pancreatic cancer (to abdominal nodules); also PET                            avid abnormal involving the sigmoid colon;                            colonoscopy 2003 Dr. Fuller Plan diverticulosis only. Providers:                Molly Banister, MD, Molly End, RN, Molly [name], Technician, Molly [name]. Molly Resides CRNA, CRNA Referring MD:             Molly Merle, MD; Molly Edward, MD Medicines:                 Complications:            No immediate complications. Estimated blood loss:                            None. Estimated Blood Loss:     Estimated blood loss: none. Procedure:                Pre-Anesthesia Assessment:                           - Prior to the procedure, a History and Physical                            was performed, and patient medications and                            allergies were reviewed. The patient's tolerance of                            previous anesthesia was also reviewed. The risks                            and benefits of the procedure and the sedation                            options and risks were discussed with the patient.                            All questions were answered, and informed consent  was obtained. Prior Anticoagulants: The patient has                            taken no previous anticoagulant or antiplatelet                            agents. ASA Grade Assessment: III - A patient with                            severe systemic disease. After reviewing the risks                            and benefits, the patient was deemed in           satisfactory condition to undergo the procedure.                           After obtaining informed consent, the colonoscope                            was passed under direct vision. Throughout the                            procedure, the patient's blood pressure, pulse, and                            oxygen saturations were monitored continuously. The                            CF-HQ190L ([id number]) Olympus adult colonoscope was                            introduced through the anus with the intention of                            advancing to the cecum. The scope was advanced to                            the sigmoid colon before the procedure was aborted.                            Medications were given. The colonoscopy was                            performed without difficulty. The patient tolerated                            the procedure well. The quality of the bowel                            preparation was good. The rectum was photographed. Scope In: 11:26:17 AM Scope Out: 11:42:56 AM Total Procedure Duration: 0 hours 16 minutes 39 seconds  Findings:      A severe stenosis was found in the sigmoid colon;  the mucosa at the       distal most [name] of the stricture was edematous but not neoplastic       appearing. I was unable to advance the adult colonoscope or and adult       gastroscope through the stenosis.      The exam was otherwise without abnormality on direct and retroflexion       views (incomplete examination given the severe sigmoid stenosis) Impression:               - Severe stenosis of the sigmoid colon without                            evident mucosal neoplasm at the distal [name] of the                            stricture I was unable to advance the colonoscope                            or a adult gastroscope through the stenosis. This                            MAY be from a primary colon cancer however I feel                            it is more likely  related to the known metastatic                            pancreatic cancer (extrinsically compressing the                            sigmoid colon). Moderate Sedation:      Not Applicable - Patient had care per Anesthesia. Recommendation:           - Patient has a contact number available for                            emergencies. The signs and symptoms of potential                            delayed complications were discussed with the                            patient. Return to normal activities tomorrow.                            Written discharge instructions were provided to the                            patient.                           - Resume previous diet.                           - Continue present medications. Procedure Code(s):        ---  Professional ---                           [phone], 53, Colonoscopy, flexible; diagnostic,                            including collection of specimen(s) by brushing or                            washing, when performed (separate procedure) Diagnosis Code(s):        --- Professional ---                           E42.353, Other intestinal obstruction unspecified                            as to partial versus complete obstruction                           R93.3, Abnormal findings on diagnostic imaging of                            other parts of digestive tract CPT copyright 2018 [employer]. All rights reserved. The codes documented in this report are preliminary and upon coder review may  be revised to meet current compliance requirements. Molly Banister, MD 12/13/2017 12:09:20 PM This report has been signed electronically. Number of Addenda: 0

## 2017-12-13 NOTE — Interval H&P Note (Signed)
History and Physical Interval Note:  12/13/2017 11:01 AM  Molly [name]  has presented today for surgery, with the diagnosis of abnormal sigmoid colon mucosa on CT pancreatic mass  The various methods of treatment have been discussed with the patient and family. After consideration of risks, benefits and other options for treatment, the patient has consented to  Procedure(s): COLONOSCOPY WITH PROPOFOL (N/A) as a surgical intervention .  The patient's history has been reviewed, patient examined, no change in status, stable for surgery.  I have reviewed the patient's chart and labs.  Questions were answered to the patient's satisfaction.     Molly [name]

## 2017-12-13 NOTE — Anesthesia Postprocedure Evaluation (Signed)
Anesthesia Post Note  Patient: Molly [name]  Procedure(s) Performed: COLONOSCOPY WITH PROPOFOL (N/A )     Patient location during evaluation: PACU Anesthesia Type: MAC Level of consciousness: awake and alert Pain management: pain level controlled Vital Signs Assessment: post-procedure vital signs reviewed and stable Respiratory status: spontaneous breathing, nonlabored ventilation and respiratory function stable Cardiovascular status: stable and blood pressure returned to baseline Postop Assessment: no apparent nausea or vomiting Anesthetic complications: no    Last Vitals:  Vitals:   12/13/17 1038 12/13/17 1152  BP: (!) 171/89 (!) 153/63  Pulse: 86 65  Resp: (!) 22 18  Temp: 36.6 C 36.6 C  SpO2: 96% 100%    Last Pain:  Vitals:   12/13/17 1152  TempSrc: Oral  PainSc: 0-No pain                 Lynda Rainwater

## 2017-12-13 NOTE — Transfer of Care (Signed)
Immediate Anesthesia Transfer of Care Note  Patient: Molly [name]  Procedure(s) Performed: COLONOSCOPY WITH PROPOFOL (N/A )  Patient Location: PACU and Endoscopy Unit  Anesthesia Type:MAC  Level of Consciousness: drowsy and responds to stimulation  Airway & Oxygen Therapy: Patient Spontanous Breathing and Patient connected to face mask oxygen  Post-op Assessment: Report given to RN and Post -op Vital signs reviewed and stable  Post vital signs: Reviewed and stable  Last Vitals:  Vitals Value Taken Time  BP 153/63 12/13/2017 11:50 AM  Temp    Pulse 66 12/13/2017 11:51 AM  Resp 17 12/13/2017 11:51 AM  SpO2 100 % 12/13/2017 11:51 AM  Vitals shown include unvalidated device data.  Last Pain:  Vitals:   12/13/17 1038  TempSrc: Oral  PainSc: 0-No pain         Complications: No apparent anesthesia complications

## 2017-12-14 ENCOUNTER — Inpatient Hospital Stay: Payer: PPO | Attending: Hematology | Admitting: Hematology

## 2017-12-14 ENCOUNTER — Encounter (HOSPITAL_COMMUNITY): Payer: Self-pay | Admitting: Gastroenterology

## 2017-12-14 VITALS — BP 153/74 | HR 69 | Temp 97.5°F | Resp 17 | Ht 62.0 in | Wt 106.5 lb

## 2017-12-14 DIAGNOSIS — E039 Hypothyroidism, unspecified: Secondary | ICD-10-CM | POA: Insufficient documentation

## 2017-12-14 DIAGNOSIS — F419 Anxiety disorder, unspecified: Secondary | ICD-10-CM | POA: Insufficient documentation

## 2017-12-14 DIAGNOSIS — E119 Type 2 diabetes mellitus without complications: Secondary | ICD-10-CM | POA: Insufficient documentation

## 2017-12-14 DIAGNOSIS — K5669 Other partial intestinal obstruction: Secondary | ICD-10-CM | POA: Insufficient documentation

## 2017-12-14 DIAGNOSIS — C786 Secondary malignant neoplasm of retroperitoneum and peritoneum: Secondary | ICD-10-CM | POA: Insufficient documentation

## 2017-12-14 DIAGNOSIS — G4700 Insomnia, unspecified: Secondary | ICD-10-CM | POA: Insufficient documentation

## 2017-12-14 DIAGNOSIS — Z800 Family history of malignant neoplasm of digestive organs: Secondary | ICD-10-CM | POA: Diagnosis not present

## 2017-12-14 DIAGNOSIS — Z90710 Acquired absence of both cervix and uterus: Secondary | ICD-10-CM | POA: Insufficient documentation

## 2017-12-14 DIAGNOSIS — C251 Malignant neoplasm of body of pancreas: Secondary | ICD-10-CM | POA: Diagnosis not present

## 2017-12-14 DIAGNOSIS — C259 Malignant neoplasm of pancreas, unspecified: Secondary | ICD-10-CM | POA: Insufficient documentation

## 2017-12-14 DIAGNOSIS — Z79899 Other long term (current) drug therapy: Secondary | ICD-10-CM | POA: Diagnosis not present

## 2017-12-14 DIAGNOSIS — I10 Essential (primary) hypertension: Secondary | ICD-10-CM | POA: Diagnosis not present

## 2017-12-15 ENCOUNTER — Encounter: Payer: Self-pay | Admitting: Hematology

## 2017-12-17 ENCOUNTER — Telehealth: Payer: Self-pay

## 2017-12-17 NOTE — Telephone Encounter (Signed)
appt made for 01/10/18 at 11 am with Dr Rush Landmark.  Letter mailed to the pt

## 2017-12-17 NOTE — Telephone Encounter (Signed)
Called patient to advise of Genetics appointment 12/18/17 @ 10:00 AM and Nutrition appointment  12/18/17 @ 11:15 AM. Patient verbalized understanding. She knows to arrive a little early to register.

## 2017-12-17 NOTE — Telephone Encounter (Signed)
-----   Message from Irving Copas., MD sent at 12/17/2017 12:30 PM EST ----- Regarding: RE: Sounds like a plan. I'm good with holding on procedures and enteral stunting for now and we can consider attempt down the road if she shows more significant changes or severe postulation with progressive dilation of her bowel. For now Molly [name], let's plan Miralax twice daily, stool softeners twice daily, low residue/low fiber diet.  I would have her take Biacodyl or Magnesium citrate if now bowel movement for 2-3 days. Britteny Fiebelkorn can you find a spot for follow up with Linna Hoff or myself in regards to bowel optimization or PA in a few weeks. Thanks all. Molly [name] ----- Message ----- From: Ileana Roup, MD Sent: 12/17/2017  11:56 AM EST To: Milus Banister, MD, Michael Boston, MD, #  I concur! Would try miralax first - once or even twice daily, scheduled. Avoid all foods with ruffage (fruits, vegetables, etc) 87, too ill for chemotherapy, in the setting of peritoneal carcinomatosis from pancreatic cancer, diversion may not be "palliative."  Gerald Stabs ----- Message ----- From: Leighton Ruff, MD Sent: [id number]   9:13 AM EST To: Milus Banister, MD, Michael Boston, MD, #  Has she tried Miralax?  My vote would be to try that for now.  She doesn't seem to be massively obstructed on CT, and it seems that diversion would be overkill if she can get by on miralax.  Her sigmoid does appear to be somewhat kinked from probable diverticular disease.  I do not see any obvious mets that would be causing colonic compression in that area.  I would not be super in favor of a stent in her due to the perforation risk that Chester Holstein has already stated.     Elmo Putt ----- Message ----- From: Truitt Merle, MD Sent: 12/15/2017   3:04 PM EST To: Milus Banister, MD, Michael Boston, MD, #  Dan, is colonic sent placement possible?  Harvie Bridge and Gerald Stabs, this is an 84 yo lady with newly diagnosed metastatic pancreatic cancer to peritoneum.   She was admitted 2 weeks ago for bowel obstruction symptoms which resolved with laxatives.  PET scan showed a hypermetabolic mass in the sigmoid colon. Dan did colonoscopy yesterday, see below. I think the severe colonic stenosis is probably from external peritoneum mets. If stent is not an option, would you consider diverting colostomy?  She does not have many medical comorbidities, but is 87 and frail, and I do not think chemo will help her. Without bowel obstruction, she may still has 3-6 months to live, she is open to surgery if we recommend. Are you guys available to see her early next week?   Thanks  Molly [name]  ----- Message ----- From: Milus Banister, MD Sent: 12/13/2017  12:14 PM EST To: Ladene Artist, MD, Truitt Merle, MD  Molly [name], Just completed (limited) examination. See full report in epic.  Molly [name]   - Severe stenosis of the sigmoid colon without evident mucosal neoplasm at the distal end of the stricture.  I was unable to advance the colonoscope or a adult gastroscope through the stenosis. This MAY be from a primary colon cancer however I feel it is more likely related to the known metastatic pancreatic cancer (extrinsically compressing the sigmoid colon).

## 2017-12-18 ENCOUNTER — Inpatient Hospital Stay: Payer: PPO

## 2017-12-18 ENCOUNTER — Encounter: Payer: Self-pay | Admitting: *Deleted

## 2017-12-18 ENCOUNTER — Encounter: Payer: Self-pay | Admitting: Hematology

## 2017-12-18 ENCOUNTER — Inpatient Hospital Stay (HOSPITAL_BASED_OUTPATIENT_CLINIC_OR_DEPARTMENT_OTHER): Payer: PPO | Admitting: Licensed Clinical Social Worker

## 2017-12-18 ENCOUNTER — Telehealth: Payer: Self-pay | Admitting: Hematology

## 2017-12-18 ENCOUNTER — Inpatient Hospital Stay (HOSPITAL_BASED_OUTPATIENT_CLINIC_OR_DEPARTMENT_OTHER): Payer: PPO | Admitting: Hematology

## 2017-12-18 VITALS — BP 144/81 | HR 57 | Temp 97.0°F | Resp 17 | Ht 62.0 in | Wt 108.0 lb

## 2017-12-18 DIAGNOSIS — E119 Type 2 diabetes mellitus without complications: Secondary | ICD-10-CM | POA: Diagnosis not present

## 2017-12-18 DIAGNOSIS — Z90710 Acquired absence of both cervix and uterus: Secondary | ICD-10-CM

## 2017-12-18 DIAGNOSIS — C251 Malignant neoplasm of body of pancreas: Secondary | ICD-10-CM

## 2017-12-18 DIAGNOSIS — K5669 Other partial intestinal obstruction: Secondary | ICD-10-CM

## 2017-12-18 DIAGNOSIS — Z79899 Other long term (current) drug therapy: Secondary | ICD-10-CM | POA: Diagnosis not present

## 2017-12-18 DIAGNOSIS — G4700 Insomnia, unspecified: Secondary | ICD-10-CM

## 2017-12-18 DIAGNOSIS — Z8542 Personal history of malignant neoplasm of other parts of uterus: Secondary | ICD-10-CM | POA: Diagnosis not present

## 2017-12-18 DIAGNOSIS — I10 Essential (primary) hypertension: Secondary | ICD-10-CM | POA: Diagnosis not present

## 2017-12-18 DIAGNOSIS — K8689 Other specified diseases of pancreas: Secondary | ICD-10-CM

## 2017-12-18 DIAGNOSIS — Z800 Family history of malignant neoplasm of digestive organs: Secondary | ICD-10-CM | POA: Diagnosis not present

## 2017-12-18 DIAGNOSIS — K56600 Partial intestinal obstruction, unspecified as to cause: Secondary | ICD-10-CM | POA: Insufficient documentation

## 2017-12-18 DIAGNOSIS — Z8049 Family history of malignant neoplasm of other genital organs: Secondary | ICD-10-CM | POA: Insufficient documentation

## 2017-12-18 DIAGNOSIS — E039 Hypothyroidism, unspecified: Secondary | ICD-10-CM

## 2017-12-18 DIAGNOSIS — C786 Secondary malignant neoplasm of retroperitoneum and peritoneum: Secondary | ICD-10-CM

## 2017-12-18 DIAGNOSIS — F419 Anxiety disorder, unspecified: Secondary | ICD-10-CM

## 2017-12-18 LAB — CMP (CANCER CENTER ONLY)
ALT: 14 U/L (ref 0–44)
AST: 17 U/L (ref 15–41)
Albumin: 3.1 g/dL — ABNORMAL LOW (ref 3.5–5.0)
Alkaline Phosphatase: 73 U/L (ref 38–126)
Anion gap: 7 (ref 5–15)
BILIRUBIN TOTAL: 0.4 mg/dL (ref 0.3–1.2)
BUN: 9 mg/dL (ref 8–23)
CO2: 33 mmol/L — ABNORMAL HIGH (ref 22–32)
CREATININE: 0.71 mg/dL (ref 0.44–1.00)
Calcium: 9.2 mg/dL (ref 8.9–10.3)
Chloride: 103 mmol/L (ref 98–111)
GFR, Est AFR Am: >60 mL/min (ref >60)
GFR, Estimated: >60 mL/min (ref >60)
Glucose, Bld: 196 mg/dL — ABNORMAL HIGH (ref 70–99)
Potassium: 3.5 mmol/L (ref 3.5–5.1)
Sodium: 143 mmol/L (ref 135–145)
TOTAL PROTEIN: 6.3 g/dL — AB (ref 6.5–8.1)

## 2017-12-18 LAB — CBC WITH DIFFERENTIAL (CANCER CENTER ONLY)
Abs Immature Granulocytes: 0.01 10*3/uL (ref 0.00–0.07)
BASOS PCT: 1 %
Basophils Absolute: 0 10*3/uL (ref 0.0–0.1)
Eosinophils Absolute: 0.1 10*3/uL (ref 0.0–0.5)
Eosinophils Relative: 1 %
HCT: 39.2 % (ref 36.0–46.0)
Hemoglobin: 12.4 g/dL (ref 12.0–15.0)
Immature Granulocytes: 0 %
Lymphocytes Relative: 31 %
Lymphs Abs: 1.6 10*3/uL (ref 0.7–4.0)
MCH: 30.8 pg (ref 26.0–34.0)
MCHC: 31.6 g/dL (ref 30.0–36.0)
MCV: 97.5 fL (ref 80.0–100.0)
Monocytes Absolute: 0.5 10*3/uL (ref 0.1–1.0)
Monocytes Relative: 9 %
Neutro Abs: 3 10*3/uL (ref 1.7–7.7)
Neutrophils Relative %: 58 %
Platelet Count: 273 10*3/uL (ref 150–400)
RBC: 4.02 MIL/uL (ref 3.87–5.11)
RDW: 13.3 % (ref 11.5–15.5)
WBC Count: 5.1 10*3/uL (ref 4.0–10.5)
nRBC: 0 % (ref 0.0–0.2)

## 2017-12-18 MED ORDER — ALPRAZOLAM 0.25 MG PO TABS: 0.2500 mg | ORAL_TABLET | Freq: Every evening | ORAL | 0 refills | Status: DC | PRN

## 2017-12-18 NOTE — Progress Notes (Signed)
Nutrition Assessment   Reason for Assessment:   Referral for low residue/low fiber diet education.     ASSESSMENT:  84 year old female with metastatic stage IV pancreatic cancer, metastatic to peritoneum.  Past medical history reviewed.  Noted admission with [facility] in November 2019.    Met with patient, husband and daughter after labs, genetic testing.  Patient was held up in lab for sometime before making it to appointment.    Has been drinking premier protein and avoiding raw fruits and vegetables  Much of visit was focused on low residue/fiber diet education   Medications: reviewed   Labs: reviewed   Anthropometrics:   Height: 62 inches Weight: 106 lb on 12/13, Noted 8/12 122 lb Family confirms weight loss BMI: 19  13% weight loss in the last 4 months, signficant   NUTRITION DIAGNOSIS: Food and nutrition related knowledge deficit related to partial bowel obstruction from mass as evidenced by education on low fiber diet   INTERVENTION:  Discussed low fiber/residue diet with patient, husband and daughter.  Information given to patient.   Discussed ways to increase calories due to current weight loss. Daughter concerned about elevation in blood glucose.   Discussed oral nutrition supplement such as retail ensure plus (350 calorie, 0 g fiber) or boost high protein (240 calories, 0 g fiber) as options to try.  Glucerna shake lower in calories and higher in fiber.  Contact information given to patient    MONITORING, EVALUATION, GOAL: Patient will follow low fiber diet and consume adequate calories to maintain weight   Next Visit: as needed, no follow-up planned at this time.  Family knows to contact me with questions  Atlas Kuc B. [name] Resides, [place], [place] Registered Dietitian [phone] (pager)

## 2017-12-18 NOTE — Progress Notes (Signed)
[employer]  Holiday representative received referral from Futures trader for emotional support and counseling.  CSW met with patient, patients husband, and daughter in [place] office to offer support and assess for needs.  Patient recently received advance stage diagnosis and has been processing through the information with her family.  Patient stated she may have additional questions today after she meets with the oncologist and receives updates on additional information/treatment options.  CSW provided education on CSW role and support programs at [facility].  CSW, patient and family discussed common feelings and emotions when being diagnosed with cancer, and the importance of support.  Patient identified her family as a positive and involved support system.  CSW also spoke with patients daughter separately and answered questions regarding hospice and support available.  CSW provided contact information for the support team and encouraged patient and family to call with questions or concerns.    Johnnye Lana, MSW, LCSW, OSW-C Clinical Social Worker [facility] [phone]

## 2017-12-18 NOTE — Progress Notes (Signed)
[place]  [phone]) [phone] [phone]) [phone]  Clinic Follow up Note   Patient Care Team: Tonia Ghent, MD as PCP - General (Family Medicine) Shon Hough, MD as Consulting Physician (Ophthalmology) Bond, Tracie Harrier, MD as Referring Physician (Ophthalmology) Jovita Gamma, MD as Consulting Physician (Neurosurgery) Rolm Bookbinder, MD as Consulting Physician (Dermatology) Eula Listen, DDS as Referring Physician (Dentistry) Ladene Artist, MD as Consulting Physician (Gastroenterology) 12/18/2017   CHIEF COMPLAIN: Follow-up pancreatic cancer and colonic bowel obstruction  SUMMARY OF ONCOLOGIC HISTORY: Oncology History   Cancer Staging Pancreatic cancer Molly [name]) Staging form: Exocrine Pancreas, AJCC 8th Edition - Clinical stage from 12/06/2017: Stage IV (cT3, cN0, pM1) - Signed by Truitt Merle, MD on 12/14/2017       Pancreatic cancer ([name])   11/13/2017 Imaging    CT AP W Contrast 11/13/17  IMPRESSION: 1. There are new findings since prior exam with the 2 dominant findings involving the pancreatic body and sigmoid colon as discussed above and below. 2. New from the prior examination is a 4 x 3.2 x 3.3 cm pancreatic body mass which is causing obstruction and subsequent dilation of the pancreatic duct in the region of pancreatic tail. This is highly suspicious for pancreatic malignancy. This impresses upon the splenic vein, superior aspect of the superior mesenteric vein and confluence of the portal vein. This partially surrounds the common hepatic artery. Extension towards the posterior gastric wall which may be involved. Given the significant growth in a relatively short period time, inflammatory process can be considered but is felt to be less likely consideration. 3. Abnormal appearance of the mid sigmoid colon. Two walls are apposed with masslike projection between the 2 loops. This is in the setting of diverticulosis (series 2 image  60). Question result of muscular hypertrophy, diverticulitis, primary sigmoid mass and less likely metastatic disease. 4. 3 nodular density seen within anterior abdominal wall hernia measuring up to 1.6 cm are new from prior CT which in the present clinical setting is suspicious for spread of tumor. 5. Increase in number of low-density lesions within the spleen. Not all can be confirmed as simple cysts. This would be an unusual configuration for metastatic disease which cannot be entirely excluded. 6. Fatty liver with intrahepatic biliary duct dilation, tiny low-density lesions and calcifications similar to prior exam. 7. Aortic Atherosclerosis (ICD10-I70.0).     11/26/2017 PET scan    PET 11/26/17  IMPRESSION: 1. Approximately 5 cm hypermetabolic mass in the pancreatic body and tail, maximum SUV 5.6, with morphologic findings suspicious for pancreatic adenocarcinoma. 2. However, there is also a hypermetabolic mass in the sigmoid colon with adjacent paracolic adenopathy. This mass has a maximum SUV of 7.4. A synchronous colon cancer is a distinct possibility. 3. Abnormal hypermetabolic nodules in the omentum, both in and adjacent to a small infraumbilical hernia, suspicious for metastatic disease. 4. Mildly hypermetabolic bilateral hilar lymph nodes. There is also some low-grade accentuated metabolic activity along a region of lingular atelectasis with some mild adjacent nodularity, as well as clustered nodularity posteriorly in the right upper lobe. I am not completely convinced that the thoracic findings represent malignancy; atypical infectious bronchiolitis with reactive lymph nodes might have a similar appearance, but this certainly deserves surveillance. Also consider a baseline CT of the chest with contrast. 5. Other imaging findings of potential clinical significance: Aortic Atherosclerosis (ICD10-I70.0). Coronary atherosclerosis with mild-to-moderate cardiomegaly.  Prominent stool throughout the colon favors constipation. Old left facial fractures.    12/01/2017 Imaging  CT AP W Contrast 12/01/17  IMPRESSION: 1. No change in irregular annular distal sigmoid colon wall thickening, cannot exclude primary sigmoid neoplasm. 2. No evidence of small-bowel obstruction. Moderate stool throughout the left colon above the site of sigmoid colon wall thickening, with new wall thickening and pericolonic fat stranding in the descending colon. Findings suggest a nonspecific descending colitis, potentially stercoral colitis due to fecal stasis. No free air or abscess. 3. Solid omental nodules are increased in size and number since 11/13/2017 CT, most compatible with progressive peritoneal carcinomatosis. 4. Poorly marginated hypoenhancing pancreatic body mass compatible with primary pancreatic adenocarcinoma, not appreciably changed in size. Worsening narrowing of the portosplenic venous confluence by the pancreatic mass. 5.  Aortic Atherosclerosis (ICD10-I70.0).     12/06/2017 Cancer Staging    Staging form: Exocrine Pancreas, AJCC 8th Edition - Clinical stage from 12/06/2017: Stage IV (cT3, cN0, pM1) - Signed by Truitt Merle, MD on 12/14/2017    12/06/2017 Initial Biopsy    Diagnosis Soft Tissue Needle Core Biopsy, peritoneal - METASTATIC ADENOCARCINOMA, CONSISTENT WITH CLINICAL HISTORY OF PRIMARY PANCREATIC MASS. Immunostains show that the tumor cells are CK7 and CDX2 positive; and CK20 negative, consistent with the interpretation of an upper GI and pancreatobiliary primary.    12/14/2017 Initial Diagnosis    Pancreatic cancer ([name])    CURRENT THERAPY: supportive care  INTERVAL HISTORY: Patient returns for follow-up.  She is accompanied by her husband, daughter and son.  She had a genetic testing down this morning, and met our dietitian, and a Education officer, museum.  She is clinically stable, she takes MiraLAX once in the morning, has small loose bowel  movement 1-3 times a day, occasional scant hematochezia. No abdominal pain, nausea, or other new symptoms.  Her fatigue is mild to moderate, she is able to function well at home.   REVIEW OF SYSTEMS:   Constitutional: Denies fevers, chills or abnormal weight loss, (+) fatigue and weight loss  Eyes: Denies blurriness of vision Ears, nose, mouth, throat, and face: Denies mucositis or sore throat Respiratory: Denies cough, dyspnea or wheezes Cardiovascular: Denies palpitation, chest discomfort or lower extremity swelling Gastrointestinal:  Denies nausea, heartburn or change in bowel habits Skin: Denies abnormal skin rashes Lymphatics: Denies new lymphadenopathy or easy bruising Neurological:Denies numbness, tingling or new weaknesses Behavioral/Psych: Mood is stable, no new changes  All other systems were reviewed with the patient and are negative.  MEDICAL HISTORY:  Past Medical History:  Diagnosis Date  . Cataract    removed both eyes  . Diabetes mellitus without complication ([place]) 05/04/7480   diet controlled   . Diverticulosis of colon   . Family history of cervical cancer   . Family history of colon cancer   . GERD (gastroesophageal reflux disease)   . Glaucoma   . History of uterine cancer   . Hyperlipidemia   . Hypertension   . Kidney stones   . MALIGNANT NEOPLASM OF UTERUS PART UNSPECIFIED 1998  . Pancreatic cancer ([place])   . Thyroid disease    hypothyroidism    SURGICAL HISTORY: Past Surgical History:  Procedure Laterality Date  . ABDOMINAL HYSTERECTOMY  12/22/1996   Uterine Cancer Dr Phineas Real  . BREAST BIOPSY  1996   Benign  . BREAST EXCISIONAL BIOPSY Left   . CHOLECYSTECTOMY    . COLONOSCOPY    . COLONOSCOPY WITH PROPOFOL N/A 12/13/2017   Procedure: COLONOSCOPY WITH PROPOFOL;  Surgeon: Milus Banister, MD;  Location: [facility];  Service: Endoscopy;  Laterality: N/A;  .  Detached Retina Repair OS    . FACIAL LACERATION REPAIR Left 01/20/2015   Procedure:  FACIAL LACERATION REPAIR;  Surgeon: Danice Goltz, MD;  Location: [facility];  Service: Ophthalmology;  Laterality: Left;  . Release R Long Finger A1 Pulley  01/23/2008   Dr Daylene Katayama  . RUPTURED GLOBE EXPLORATION AND REPAIR Left 01/20/2015   Procedure: REPAIR OF RUPTURED GLOBE and FACIAL LACERATIONS;  Surgeon: Danice Goltz, MD;  Location: [place];  Service: Ophthalmology;  Laterality: Left;  . UPPER GASTROINTESTINAL ENDOSCOPY      I have reviewed the social history and family history with the patient and they are unchanged from previous note.  ALLERGIES:  is allergic to erythromycin; ezetimibe-simvastatin; latex; macrodantin [nitrofurantoin macrocrystal]; metformin and related; morphine; other; petroleum jelly [skin protectants, misc.]; preparation h [phenyleph-shark liv oil-mo-pet]; procaine hcl; simvastatin; sulfamethoxazole; white petrolatum; adhesive [tape]; and tape.  MEDICATIONS:  Current Outpatient Medications  Medication Sig Dispense Refill  . acetaminophen (TYLENOL) 500 MG tablet Take 1 tablet (500 mg total) by mouth every 6 (six) hours as needed for mild pain or headache. 30 tablet 0  . atenolol (TENORMIN) 25 MG tablet Take 1 tablet ('25mg'$ ) by mouth daily 90 tablet 1  . Biotin 10 MG CAPS Take 1 capsule by mouth daily.     . Black Cohosh-SoyIsoflav-Magnol (ESTROVEN MENOPAUSE RELIEF) CAPS Take by mouth.    . Blood Glucose Monitoring Suppl ([place]) w/Device KIT Use to check blood sugar once daily and as needed.  Non insulin dependent  E11.9 1 kit 0  . brimonidine (ALPHAGAN P) 0.1 % SOLN Apply 1 drop to eye 2 (two) times daily.    . Cholecalciferol ([employer] VITAMIN D) 2000 UNITS CAPS Take 1 capsule (2,000 Units total) by mouth daily.    . Coenzyme Q10 (COQ10 PO) Take 1 tablet by mouth daily.     . dorzolamide-timolol (COSOPT) 22.3-6.8 MG/ML ophthalmic solution Place 1 drop into the right eye 2 (two) times daily.     Marland Kitchen glucose blood (ONETOUCH VERIO) test strip Use  as instructed to check blood sugar once daily and as needed.  Non insulin dependent.  E11.9 100 each 1  . latanoprost (XALATAN) 0.005 % ophthalmic solution Place 1 drop into the right eye at bedtime.     Marland Kitchen levothyroxine (SYNTHROID, LEVOTHROID) 88 MCG tablet Take 1 tablet by mouth daily 90 tablet 1  . lidocaine (XYLOCAINE) 5 % ointment APPLY A SMALL AMOUNT TO RECTUM 3-4 TIMES PER DAY AS NEEDED 35.44 g 1  . Multiple Vitamin (MULTIVITAMIN) tablet Take 1 tablet by mouth daily.      . Na Sulfate-K Sulfate-Mg Sulf 17.5-3.13-1.6 GM/177ML SOLN Suprep (no substitutions)-TAKE AS DIRECTED. 354 mL 0  . ONETOUCH DELICA LANCETS 06Y MISC Use to check blood sugar once daily and as needed.  Non insulin dependent.  E11.9 100 each 1  . polyethylene glycol (MIRALAX / GLYCOLAX) packet Take 17 g by mouth daily. 14 each 0  . ALPRAZolam (XANAX) 0.25 MG tablet Take 1 tablet (0.25 mg total) by mouth at bedtime as needed for anxiety or sleep. 15 tablet 0   No current facility-administered medications for this visit.     PHYSICAL EXAMINATION: ECOG PERFORMANCE STATUS: 1 - Symptomatic but completely ambulatory  Vitals:   12/18/17 1230  BP: (!) 144/81  Pulse: (!) 57  Resp: 17  Temp: (!) 97 F (36.1 C)  SpO2: 100%   Filed Weights   12/18/17 1230  Weight: 108 lb (49 kg)  GENERAL:alert, no distress and comfortable SKIN: skin color, texture, turgor are normal, no rashes or significant lesions EYES: normal, Conjunctiva are pink and non-injected, sclera clear OROPHARYNX:no exudate, no erythema and lips, buccal mucosa, and tongue normal  NECK: supple, thyroid normal size, non-tender, without nodularity LYMPH:  no palpable lymphadenopathy in the cervical, axillary or inguinal LUNGS: clear to auscultation and percussion with normal breathing effort HEART: regular rate & rhythm and no murmurs and no lower extremity edema ABDOMEN:abdomen soft, non-tender and normal bowel sounds Musculoskeletal:no cyanosis of digits  and no clubbing  NEURO: alert & oriented x 3 with fluent speech, no focal motor/sensory deficits  LABORATORY DATA:  I have reviewed the data as listed CBC Latest Ref Rng & Units 12/18/2017 12/06/2017 12/03/2017  WBC 4.0 - 10.5 K/uL 5.1 6.1 6.3  Hemoglobin 12.0 - 15.0 g/dL 12.4 13.4 12.5  Hematocrit 36.0 - 46.0 % 39.2 42.7 39.5  Platelets 150 - 400 K/uL 273 317 222     CMP Latest Ref Rng & Units 12/18/2017 12/04/2017 12/03/2017  Glucose 70 - 99 mg/dL 196(H) 125(H) 123(H)  BUN 8 - 23 mg/dL 9 7(L) 7(L)  Creatinine 0.44 - 1.00 mg/dL 0.71 0.58 0.65  Sodium 135 - 145 mmol/L 143 139 138  Potassium 3.5 - 5.1 mmol/L 3.5 3.8 2.6(LL)  Chloride 98 - 111 mmol/L 103 106 103  CO2 22 - 32 mmol/L 33(H) 21(L) 26  Calcium 8.9 - 10.3 mg/dL 9.2 8.9 8.6(L)  Total Protein 6.5 - 8.1 g/dL 6.3(L) - -  Total Bilirubin 0.3 - 1.2 mg/dL 0.4 - -  Alkaline Phos 38 - 126 U/L 73 - -  AST 15 - 41 U/L 17 - -  ALT 0 - 44 U/L 14 - -      RADIOGRAPHIC STUDIES: I have personally reviewed the radiological images as listed and agreed with the findings in the report. No results found.   ASSESSMENT & PLAN:  Molly [name] is a 84 y.o. female with   1. Partial Bowel obstruction secondary to severe sigmoid colon stricture  -She was hospitalized on December 01, 2017 for symptoms of bowel obstruction, resolved after laxatives  -Due to the PET scan findings of a hypermetabolic sigmoid colon mass, she underwent colonoscopy yesterday by Dr. Ardis Hughs, which showed severe stricture in the sigmoid colon, scope was not able to pass -She is currently taking MiraLAX once a day, has small bowel movements a few times a day, no pain, nausea, or other symptoms. -I have discussed stent placement with GI Drs. Ardis Hughs and McCammon. They feel the success rate will be low due to the severe stenosis and the location, and due to the concern of the chronic structure may be related to diverticulosis, risk of perforation is very high.  If patient  develops worsening clinical symptoms secondary to bowel obstruction, Dr. Rush Landmark is willing to try. -I also discussed palliative diverting colostomy with colorectal surgeons Dr. Marcello Moores and Dema Severin, both recommend medical management with laxative for now, and avoid surgery during the overall palliative approach for her pancreatic cancer. -She has met our dietitian this morning, low residual diet was recommended and discussed, she agrees to follow. -She knows to watch her symptoms, especially abdominal pain, nausea, difficulty to have a bowel movement, she will call me or go to local emergency room.  3.Pancreatic cancer, metastatic to peritoneum, stage IV  -I reviewed and discussed her recent peritoneal nodule biopsy pathology and coloscopy needs.  -Her peritoneal biopsy results were consistent with primary pancreatic cancer.  She is now staged IV and her cancer is no longer curable. Her cancer is still treatable.  Goal of therapy is prolong her life and preserve her quality of life. -We discussed treatment options.  Due to her metastatic disease, surgery is not an option.  She does not have significant pain, no need palliative radiation or celiac block.   -Given her age, she is not a candidate for combined chemotherapy.  She is asymptomatic from her pancreatic cancer, except a weight loss, I think the benefit of palliative chemotherapy with single agent gemcitabine is also very limited, and I would likely impact her quality of life.  After lengthy discussion, we decided to hold chemotherapy for now -I have requested MMR and FO on her biopsy to see if she is a candidate for immunotherapy or targeted therapy. -She saw our genetic counselor this morning, and underwent genetic testing, to see if she has BRCA mutation   3. DM -She previously tried metformin, could not tolerate due to GI side effects -Her blood glucose has been higher lately, likely due to her newly diagnosed pancreatic cancer. -I  encouraged her to monitor her blood glucose closely, if fasting glucose more than 150, postprandial more than 200, she knows to contact her primary care physician to see if he needs she needs to try other diabetic medication.  4. Anxiety and insomnia  -She reports occasional anxiety and insomnia, she only slept a few hours yesterday -I called in Xanax 0.25 mg as needed for anxiety and insomnia  Plan -Lab and follow-up in 3 weeks -She will probably see Dr. Rush Landmark in a few weeks to discuss colonic stent placement   All questions were answered. The patient knows to call the clinic with any problems, questions or concerns. No barriers to learning was detected. I spent 25 minutes counseling the patient face to face. The total time spent in the appointment was 30 minutes and more than 50% was on counseling and review of test results     Truitt Merle, MD 12/18/17

## 2017-12-18 NOTE — Progress Notes (Signed)
REFERRING PROVIDER: Truitt Merle, MD [address] [place], [place] [address]  PRIMARY PROVIDER:  Tonia Ghent, MD  PRIMARY REASON FOR VISIT:  1. Malignant neoplasm of body of pancreas ([place])   2. History of uterine cancer   3. Family history of colon cancer   4. Family history of cervical cancer      HISTORY OF PRESENT ILLNESS:   Molly [name], a 84 y.o. female, was seen for a [facility] cancer genetics consultation at the request of Dr. Burr Medico due to a personal and family history of cancer.  Molly [name] presents to clinic today to discuss the possibility of a hereditary predisposition to cancer, genetic testing, and to further clarify her future cancer risks, as well as potential cancer risks for family members.   At the age of 49, Molly [name] was diagnosed with uterine cancer. This was treated with hysterectomy.  In 2019, at the age of 87, Molly [name] was diagnosed with pancreatic cancer. Treatment plan is still being determined, possible chemotherapy, possible palliative care.  CANCER HISTORY:  Oncology History   Cancer Staging Pancreatic cancer [facility]) Staging form: Exocrine Pancreas, AJCC 8th Edition - Clinical stage from 12/06/2017: Stage IV (cT3, cN0, pM1) - Signed by Truitt Merle, MD on 12/14/2017       Pancreatic cancer ([place])   11/13/2017 Imaging    CT AP W Contrast 11/13/17  IMPRESSION: 1. There are new findings since prior exam with the 2 dominant findings involving the pancreatic body and sigmoid colon as discussed above and below. 2. New from the prior examination is a 4 x 3.2 x 3.3 cm pancreatic body mass which is causing obstruction and subsequent dilation of the pancreatic duct in the region of pancreatic tail. This is highly suspicious for pancreatic malignancy. This impresses upon the splenic vein, superior aspect of the superior mesenteric vein and confluence of the portal vein. This partially surrounds the common hepatic artery. Extension towards the posterior  gastric wall which may be involved. Given the significant growth in a relatively short period time, inflammatory process can be considered but is felt to be less likely consideration. 3. Abnormal appearance of the mid sigmoid colon. Two walls are apposed with masslike projection between the 2 loops. This is in the setting of diverticulosis (series 2 image 60). Question result of muscular hypertrophy, diverticulitis, primary sigmoid mass and less likely metastatic disease. 4. 3 nodular density seen within anterior abdominal wall hernia measuring up to 1.6 cm are new from prior CT which in the present clinical setting is suspicious for spread of tumor. 5. Increase in number of low-density lesions within the spleen. Not all can be confirmed as simple cysts. This would be an unusual configuration for metastatic disease which cannot be entirely excluded. 6. Fatty liver with intrahepatic biliary duct dilation, tiny low-density lesions and calcifications similar to prior exam. 7. Aortic Atherosclerosis (ICD10-I70.0).     11/26/2017 PET scan    PET 11/26/17  IMPRESSION: 1. Approximately 5 cm hypermetabolic mass in the pancreatic body and tail, maximum SUV 5.6, with morphologic findings suspicious for pancreatic adenocarcinoma. 2. However, there is also a hypermetabolic mass in the sigmoid colon with adjacent paracolic adenopathy. This mass has a maximum SUV of 7.4. A synchronous colon cancer is a distinct possibility. 3. Abnormal hypermetabolic nodules in the omentum, both in and adjacent to a small infraumbilical hernia, suspicious for metastatic disease. 4. Mildly hypermetabolic bilateral hilar lymph nodes. There is also some low-grade accentuated metabolic activity along a  region of lingular atelectasis with some mild adjacent nodularity, as well as clustered nodularity posteriorly in the right upper lobe. I am not completely convinced that the thoracic findings  represent malignancy; atypical infectious bronchiolitis with reactive lymph nodes might have a similar appearance, but this certainly deserves surveillance. Also consider a baseline CT of the chest with contrast. 5. Other imaging findings of potential clinical significance: Aortic Atherosclerosis (ICD10-I70.0). Coronary atherosclerosis with mild-to-moderate cardiomegaly. Prominent stool throughout the colon favors constipation. Old left facial fractures.    12/01/2017 Imaging    CT AP W Contrast 12/01/17  IMPRESSION: 1. No change in irregular annular distal sigmoid colon wall thickening, cannot exclude primary sigmoid neoplasm. 2. No evidence of small-bowel obstruction. Moderate stool throughout the left colon above the site of sigmoid colon wall thickening, with new wall thickening and pericolonic fat stranding in the descending colon. Findings suggest a nonspecific descending colitis, potentially stercoral colitis due to fecal stasis. No free air or abscess. 3. Solid omental nodules are increased in size and number since 11/13/2017 CT, most compatible with progressive peritoneal carcinomatosis. 4. Poorly marginated hypoenhancing pancreatic body mass compatible with primary pancreatic adenocarcinoma, not appreciably changed in size. Worsening narrowing of the portosplenic venous confluence by the pancreatic mass. 5.  Aortic Atherosclerosis (ICD10-I70.0).     12/06/2017 Cancer Staging    Staging form: Exocrine Pancreas, AJCC 8th Edition - Clinical stage from 12/06/2017: Stage IV (cT3, cN0, pM1) - Signed by Truitt Merle, MD on 12/14/2017    12/06/2017 Initial Biopsy    Diagnosis Soft Tissue Needle Core Biopsy, peritoneal - METASTATIC ADENOCARCINOMA, CONSISTENT WITH CLINICAL HISTORY OF PRIMARY PANCREATIC MASS. Immunostains show that the tumor cells are CK7 and CDX2 positive; and CK20 negative, consistent with the interpretation of an upper GI and pancreatobiliary primary.     12/14/2017 Initial Diagnosis    Pancreatic cancer ([place])     HORMONAL RISK FACTORS:  Menarche was at age 37.  First live birth at age 83.  OCP use for approximately 0 years.  Ovaries intact: yes.  Hysterectomy: yes.  Menopausal status: postmenopausal.  HRT use: 15 years. Colonoscopy: yes; normal. Mammogram within the last year: yes. Number of breast biopsies: 1.   Past Medical History:  Diagnosis Date  . Cataract    removed both eyes  . Diabetes mellitus without complication ([place]) 5/0/5697   diet controlled   . Diverticulosis of colon   . Family history of cervical cancer   . Family history of colon cancer   . GERD (gastroesophageal reflux disease)   . Glaucoma   . History of uterine cancer   . Hyperlipidemia   . Hypertension   . Kidney stones   . MALIGNANT NEOPLASM OF UTERUS PART UNSPECIFIED 1998  . Pancreatic cancer ([place])   . Thyroid disease    hypothyroidism    Past Surgical History:  Procedure Laterality Date  . ABDOMINAL HYSTERECTOMY  12/22/1996   Uterine Cancer Dr Phineas Real  . BREAST BIOPSY  1996   Benign  . BREAST EXCISIONAL BIOPSY Left   . CHOLECYSTECTOMY    . COLONOSCOPY    . COLONOSCOPY WITH PROPOFOL N/A 12/13/2017   Procedure: COLONOSCOPY WITH PROPOFOL;  Surgeon: Milus Banister, MD;  Location: [facility];  Service: Endoscopy;  Laterality: N/A;  . Detached Retina Repair OS    . FACIAL LACERATION REPAIR Left 01/20/2015   Procedure: FACIAL LACERATION REPAIR;  Surgeon: Danice Goltz, MD;  Location: [facility];  Service: Ophthalmology;  Laterality: Left;  . Release R  Long Finger A1 Pulley  01/23/2008   Dr Daylene Katayama  . RUPTURED GLOBE EXPLORATION AND REPAIR Left 01/20/2015   Procedure: REPAIR OF RUPTURED GLOBE and FACIAL LACERATIONS;  Surgeon: Danice Goltz, MD;  Location: [facility];  Service: Ophthalmology;  Laterality: Left;  . UPPER GASTROINTESTINAL ENDOSCOPY      Social History   Socioeconomic History  . Marital status: Married    Spouse  name: Trilby Drummer  . Number of children: 3  . Years of education: Not on file  . Highest education level: Not on file  Occupational History  . Occupation: Cabin crew    Comment: Retired  . 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 Financial resource strain: Not on file  . Food insecurity:    Worry: Not on file    Inability: Not on file  . Transportation needs:    Medical: Not on file    Non-medical: Not on file  Tobacco Use  . Smoking status: Never Smoker  . Smokeless tobacco: Never Used  Substance and Sexual Activity  . Alcohol use: No    Alcohol/week: 0.0 standard drinks  . Drug use: No  . Sexual activity: Not Currently  Lifestyle  . Physical activity:    Days per week: Not on file    Minutes per session: Not on file  . Stress: Not on file  Relationships  . Social connections:    Talks on phone: Not on file    Gets together: Not on file    Attends religious service: Not on file    Active member of club or organization: Not on file    Attends meetings of clubs or organizations: Not on file    Relationship status: Not on file  Other Topics Concern  . Not on file  Social History Narrative   Married 1952   Retired Network engineer and bookkeeper     FAMILY HISTORY:  We obtained a detailed, 4-generation family history.  Significant diagnoses are listed below: Family History  Problem Relation Age of Onset  . Heart disease Father 63       MI  . Cancer Sister 67       Cervical Cancer  . Cancer Mother 42       gastric cancer- pt states stomach or  liver cancer - unsure which   . Colon cancer Maternal Aunt        dx 11s  . Cancer Maternal Grandmother        possible stomach cancer  . Diabetes Neg Hx   . Breast cancer Neg Hx   . Colon polyps Neg Hx   . Esophageal cancer Neg Hx   . Rectal cancer Neg Hx     Ms. Dudziak has two children: a son, 66, and a daughter, 12. She has three grandchildren. Ms. Gwilliam also had a sister who died of cervical cancer at  55.  Ms. Boeder mother died at 42, she did have cancer but unsure what type; possibly gastric, possibly liver. She had 10 siblings. One of the patient's maternal aunts did have colon cancer diagnosed in her 61s, no cancers for her other aunts/uncles that she is aware of. No cancers in maternal cousins. Her maternal grandmother possibly had cancer, she believes some sort of abdominal cancer, died at 60. Her maternal grandfather died at 69.   Ms. Piontek father died of a heart attack at 39. He had 6 siblings. One of his brothers possibly had cancer, the patient  is unsure. No cancers for the patient's paternal cousins. Her paternal grandfather died at 30, grandmother died at 40, no cancers.  Ms. Whan is unaware of previous family history of genetic testing for hereditary cancer risks. Patient's maternal ancestors are of [place] descent, and paternal ancestors are of [place] descent. There is no reported Ashkenazi Jewish ancestry. There is no known consanguinity.  GENETIC COUNSELING ASSESSMENT: PANDORA MCCRACKIN is a 84 y.o. female with a personal and family history which is somewhat suggestive of a Hereditary Cancer Predisposition Syndrome. We, therefore, discussed and recommended the following at today's visit.   DISCUSSION: We discussed that about 5-10% of cancer cases are hereditary. We also discussed the BRCA genes We reviewed the characteristics, features and inheritance patterns of hereditary cancer syndromes. We also discussed genetic testing, including the appropriate family members to test, the process of testing, insurance coverage and turn-around-time for results. We discussed the implications of a negative, positive and/or variant of uncertain significant result. We recommended Ms. Lightcap pursue genetic testing for the [facility] Common Hereditary Cancers Panel.  The Common Hereditary Cancers Panel offered by [facility] includes sequencing and/or deletion duplication testing of the following 47  genes: APC, ATM, AXIN2, BARD1, BMPR1A, BRCA1, BRCA2, BRIP1, CDH1, CDKN2A (p14ARF), CDKN2A (p16INK4a), CKD4, CHEK2, CTNNA1, DICER1, EPCAM (Deletion/duplication testing only), GREM1 (promoter region deletion/duplication testing only), KIT, MEN1, MLH1, MSH2, MSH3, MSH6, MUTYH, NBN, NF1, NHTL1, PALB2, PDGFRA, PMS2, POLD1, POLE, PTEN, RAD50, RAD51C, RAD51D, SDHB, SDHC, SDHD, SMAD4, SMARCA4. STK11, TP53, TSC1, TSC2, and VHL.  The following genes were evaluated for sequence changes only: SDHA and HOXB13 c.251G>A variant only.  We discussed that if she is found to have a mutation in one of these genes, it may alter future medical management recommendations such as increased cancer screenings and consideration of risk reducing surgeries. It could potentially open up options for treatment with PARP inhibitors. A positive result could also have implications for the patient's family members.   A Negative result would mean we were unable to identify a hereditary component to her cancer, but does not rule out the possibility of a hereditary basis for her cancer.  There could be mutations that are undetectable by current technology, or in genes not yet tested or identified to increase cancer risk.    We discussed the potential to find a Variant of Uncertain Significance or VUS.  These are variants that have not yet been identified as pathogenic or benign, and it is unknown if this variant is associated with increased cancer risk or if this is a normal finding.  Most VUS's are reclassified to benign or likely benign.   It should not be used to make medical management decisions. With time, we suspect the lab will determine the significance of any VUS's identified if any.   Based on Ms. 60 personal and family history of cancer, she meets NCCN medical criteria for genetic testing. Despite that she meets criteria, she may still have an out of pocket cost. The lab will provide her with an OOP if any.  PLAN: After  considering the risks, benefits, and limitations, Ms. Mucha  provided informed consent to pursue genetic testing and the blood sample was sent to [facility] for analysis of the Common Hereditary Cancers Panel. Results should be available within approximately 2-3 weeks' time, at which point they will be disclosed by telephone to Ms. Ottaway, as will any additional recommendations warranted by these results. Ms. Line will receive a summary of her genetic counseling visit and  a copy of her results once available. This information will also be available in Epic.   Lastly, we encouraged Ms. Conlee to remain in contact with cancer genetics annually so that we can continuously update the family history and inform her of any changes in cancer genetics and testing that may be of benefit for this family.   Ms.  Areola questions were answered to her satisfaction today. Our contact information was provided should additional questions or concerns arise. Thank you for the referral and allowing [place] to share in the care of your patient.   Faith Rogue, MS Genetic Counselor [email or web address] Phone: [phone]   The patient was seen for a total of 40 minutes in face-to-face genetic counseling.  The patient was accompanied today by her husband, Trilby Drummer.

## 2017-12-18 NOTE — Telephone Encounter (Signed)
Printed calendar and avs. °

## 2017-12-19 LAB — CANCER ANTIGEN 19-9: CA 19-9: 630 U/mL — ABNORMAL HIGH (ref 0–35)

## 2017-12-24 ENCOUNTER — Telehealth: Payer: Self-pay

## 2017-12-24 NOTE — Telephone Encounter (Signed)
Yes, please confirm with pt that she wants to see med/onc, not surgeon at [facility], and refer her, thanks   Truitt Merle MD

## 2017-12-24 NOTE — Telephone Encounter (Signed)
Patient calls wanting to know if Dr. Burr Medico thinks a referral to [facility] for second opinion would be appropriate?  [phone]

## 2017-12-25 ENCOUNTER — Other Ambulatory Visit: Payer: Self-pay

## 2017-12-25 ENCOUNTER — Other Ambulatory Visit: Payer: Self-pay | Admitting: Family Medicine

## 2017-12-25 ENCOUNTER — Telehealth: Payer: Self-pay

## 2017-12-25 DIAGNOSIS — C259 Malignant neoplasm of pancreas, unspecified: Secondary | ICD-10-CM | POA: Diagnosis not present

## 2017-12-25 DIAGNOSIS — C251 Malignant neoplasm of body of pancreas: Secondary | ICD-10-CM

## 2017-12-25 DIAGNOSIS — E119 Type 2 diabetes mellitus without complications: Secondary | ICD-10-CM

## 2017-12-25 NOTE — Telephone Encounter (Signed)
Spoke with patient informed her will make referral to [facility] for second opinion, referral made.

## 2017-12-28 ENCOUNTER — Telehealth: Payer: Self-pay | Admitting: Hematology

## 2017-12-28 ENCOUNTER — Encounter (HOSPITAL_COMMUNITY): Payer: Self-pay | Admitting: Emergency Medicine

## 2017-12-28 ENCOUNTER — Emergency Department (HOSPITAL_COMMUNITY): Payer: PPO

## 2017-12-28 ENCOUNTER — Inpatient Hospital Stay (HOSPITAL_COMMUNITY)
Admission: EM | Admit: 2017-12-28 | Discharge: 2018-01-01 | DRG: 389 | Disposition: A | Discharge status: Hospice | Payer: PPO | Attending: Internal Medicine | Admitting: Internal Medicine

## 2017-12-28 DIAGNOSIS — Z515 Encounter for palliative care: Secondary | ICD-10-CM | POA: Diagnosis not present

## 2017-12-28 DIAGNOSIS — R109 Unspecified abdominal pain: Secondary | ICD-10-CM

## 2017-12-28 DIAGNOSIS — K219 Gastro-esophageal reflux disease without esophagitis: Secondary | ICD-10-CM | POA: Diagnosis present

## 2017-12-28 DIAGNOSIS — F419 Anxiety disorder, unspecified: Secondary | ICD-10-CM | POA: Diagnosis not present

## 2017-12-28 DIAGNOSIS — E876 Hypokalemia: Secondary | ICD-10-CM | POA: Diagnosis not present

## 2017-12-28 DIAGNOSIS — Z79899 Other long term (current) drug therapy: Secondary | ICD-10-CM

## 2017-12-28 DIAGNOSIS — C801 Malignant (primary) neoplasm, unspecified: Secondary | ICD-10-CM | POA: Diagnosis not present

## 2017-12-28 DIAGNOSIS — C251 Malignant neoplasm of body of pancreas: Secondary | ICD-10-CM

## 2017-12-28 DIAGNOSIS — Z79890 Hormone replacement therapy (postmenopausal): Secondary | ICD-10-CM | POA: Diagnosis not present

## 2017-12-28 DIAGNOSIS — R933 Abnormal findings on diagnostic imaging of other parts of digestive tract: Secondary | ICD-10-CM | POA: Diagnosis not present

## 2017-12-28 DIAGNOSIS — R54 Age-related physical debility: Secondary | ICD-10-CM | POA: Diagnosis not present

## 2017-12-28 DIAGNOSIS — G893 Neoplasm related pain (acute) (chronic): Secondary | ICD-10-CM | POA: Diagnosis not present

## 2017-12-28 DIAGNOSIS — K449 Diaphragmatic hernia without obstruction or gangrene: Secondary | ICD-10-CM | POA: Diagnosis not present

## 2017-12-28 DIAGNOSIS — Z882 Allergy status to sulfonamides status: Secondary | ICD-10-CM

## 2017-12-28 DIAGNOSIS — Z66 Do not resuscitate: Secondary | ICD-10-CM | POA: Diagnosis present

## 2017-12-28 DIAGNOSIS — E785 Hyperlipidemia, unspecified: Secondary | ICD-10-CM | POA: Diagnosis not present

## 2017-12-28 DIAGNOSIS — E440 Moderate protein-calorie malnutrition: Secondary | ICD-10-CM | POA: Diagnosis present

## 2017-12-28 DIAGNOSIS — Z7189 Other specified counseling: Secondary | ICD-10-CM | POA: Diagnosis not present

## 2017-12-28 DIAGNOSIS — Z91048 Other nonmedicinal substance allergy status: Secondary | ICD-10-CM

## 2017-12-28 DIAGNOSIS — E1165 Type 2 diabetes mellitus with hyperglycemia: Secondary | ICD-10-CM | POA: Diagnosis not present

## 2017-12-28 DIAGNOSIS — Z87442 Personal history of urinary calculi: Secondary | ICD-10-CM | POA: Diagnosis not present

## 2017-12-28 DIAGNOSIS — K5669 Other partial intestinal obstruction: Secondary | ICD-10-CM | POA: Diagnosis not present

## 2017-12-28 DIAGNOSIS — Z7401 Bed confinement status: Secondary | ICD-10-CM | POA: Diagnosis not present

## 2017-12-28 DIAGNOSIS — K56609 Unspecified intestinal obstruction, unspecified as to partial versus complete obstruction: Secondary | ICD-10-CM | POA: Diagnosis not present

## 2017-12-28 DIAGNOSIS — E119 Type 2 diabetes mellitus without complications: Secondary | ICD-10-CM | POA: Diagnosis present

## 2017-12-28 DIAGNOSIS — H409 Unspecified glaucoma: Secondary | ICD-10-CM | POA: Diagnosis not present

## 2017-12-28 DIAGNOSIS — K56600 Partial intestinal obstruction, unspecified as to cause: Secondary | ICD-10-CM | POA: Diagnosis not present

## 2017-12-28 DIAGNOSIS — F411 Generalized anxiety disorder: Secondary | ICD-10-CM | POA: Diagnosis not present

## 2017-12-28 DIAGNOSIS — C259 Malignant neoplasm of pancreas, unspecified: Secondary | ICD-10-CM | POA: Diagnosis present

## 2017-12-28 DIAGNOSIS — M2550 Pain in unspecified joint: Secondary | ICD-10-CM | POA: Diagnosis not present

## 2017-12-28 DIAGNOSIS — Z8542 Personal history of malignant neoplasm of other parts of uterus: Secondary | ICD-10-CM

## 2017-12-28 DIAGNOSIS — Z681 Body mass index (BMI) 19 or less, adult: Secondary | ICD-10-CM | POA: Diagnosis not present

## 2017-12-28 DIAGNOSIS — C786 Secondary malignant neoplasm of retroperitoneum and peritoneum: Secondary | ICD-10-CM | POA: Diagnosis present

## 2017-12-28 DIAGNOSIS — R1013 Epigastric pain: Secondary | ICD-10-CM | POA: Diagnosis not present

## 2017-12-28 DIAGNOSIS — E039 Hypothyroidism, unspecified: Secondary | ICD-10-CM | POA: Diagnosis present

## 2017-12-28 DIAGNOSIS — Z888 Allergy status to other drugs, medicaments and biological substances status: Secondary | ICD-10-CM

## 2017-12-28 DIAGNOSIS — Z885 Allergy status to narcotic agent status: Secondary | ICD-10-CM

## 2017-12-28 DIAGNOSIS — Z91040 Latex allergy status: Secondary | ICD-10-CM

## 2017-12-28 DIAGNOSIS — Z8249 Family history of ischemic heart disease and other diseases of the circulatory system: Secondary | ICD-10-CM

## 2017-12-28 DIAGNOSIS — I10 Essential (primary) hypertension: Secondary | ICD-10-CM | POA: Diagnosis not present

## 2017-12-28 DIAGNOSIS — R0789 Other chest pain: Secondary | ICD-10-CM | POA: Diagnosis not present

## 2017-12-28 DIAGNOSIS — R0902 Hypoxemia: Secondary | ICD-10-CM | POA: Diagnosis not present

## 2017-12-28 DIAGNOSIS — R404 Transient alteration of awareness: Secondary | ICD-10-CM | POA: Diagnosis not present

## 2017-12-28 LAB — COMPREHENSIVE METABOLIC PANEL
ALBUMIN: 3.5 g/dL (ref 3.5–5.0)
ALT: 16 U/L (ref 0–44)
AST: 20 U/L (ref 15–41)
Alkaline Phosphatase: 70 U/L (ref 38–126)
Anion gap: 12 (ref 5–15)
BUN: 11 mg/dL (ref 8–23)
CO2: 26 mmol/L (ref 22–32)
Calcium: 9.1 mg/dL (ref 8.9–10.3)
Chloride: 103 mmol/L (ref 98–111)
Creatinine, Ser: 0.61 mg/dL (ref 0.44–1.00)
GFR calc Af Amer: >60 mL/min (ref >60)
GFR calc non Af Amer: >60 mL/min (ref >60)
Glucose, Bld: 150 mg/dL — ABNORMAL HIGH (ref 70–99)
Potassium: 3 mmol/L — ABNORMAL LOW (ref 3.5–5.1)
Sodium: 141 mmol/L (ref 135–145)
Total Bilirubin: 0.6 mg/dL (ref 0.3–1.2)
Total Protein: 6.9 g/dL (ref 6.5–8.1)

## 2017-12-28 LAB — URINALYSIS, ROUTINE W REFLEX MICROSCOPIC
Bilirubin Urine: NEGATIVE
Glucose, UA: NEGATIVE mg/dL
Hgb urine dipstick: NEGATIVE
Ketones, ur: 20 mg/dL — AB
Leukocytes, UA: NEGATIVE
Nitrite: NEGATIVE
PH: 7 (ref 5.0–8.0)
Protein, ur: NEGATIVE mg/dL
Specific Gravity, Urine: 1.008 (ref 1.005–1.030)

## 2017-12-28 LAB — LIPASE, BLOOD: Lipase: 24 U/L (ref 11–51)

## 2017-12-28 LAB — CBC
HCT: 46.8 % — ABNORMAL HIGH (ref 36.0–46.0)
Hemoglobin: 14.8 g/dL (ref 12.0–15.0)
MCH: 31 pg (ref 26.0–34.0)
MCHC: 31.6 g/dL (ref 30.0–36.0)
MCV: 97.9 fL (ref 80.0–100.0)
Platelets: 263 10*3/uL (ref 150–400)
RBC: 4.78 MIL/uL (ref 3.87–5.11)
RDW: 13.2 % (ref 11.5–15.5)
WBC: 8.7 10*3/uL (ref 4.0–10.5)
nRBC: 0 % (ref 0.0–0.2)

## 2017-12-28 MED ORDER — SODIUM CHLORIDE 0.9 % IV BOLUS
1000.0000 mL | Freq: Once | INTRAVENOUS | Status: AC
  Administered 2017-12-28: 1000 mL via INTRAVENOUS

## 2017-12-28 MED ORDER — ONDANSETRON HCL 4 MG/2ML IJ SOLN
4.0000 mg | Freq: Once | INTRAMUSCULAR | Status: AC
  Administered 2017-12-28: 4 mg via INTRAVENOUS
  Filled 2017-12-28: qty 2

## 2017-12-28 MED ORDER — FENTANYL CITRATE (PF) 100 MCG/2ML IJ SOLN
50.0000 ug | Freq: Once | INTRAMUSCULAR | Status: AC
  Administered 2017-12-28: 50 ug via INTRAVENOUS
  Filled 2017-12-28: qty 2

## 2017-12-28 MED ORDER — ACETAMINOPHEN 650 MG RE SUPP: 650.0000 mg | Freq: Four times a day (QID) | RECTAL | Status: DC | PRN

## 2017-12-28 MED ORDER — FENTANYL CITRATE (PF) 100 MCG/2ML IJ SOLN
25.0000 ug | INTRAMUSCULAR | Status: DC | PRN
  Administered 2017-12-29 (×4): 25 ug via INTRAVENOUS
  Filled 2017-12-28 (×4): qty 2

## 2017-12-28 MED ORDER — IOPAMIDOL (ISOVUE-300) INJECTION 61%
INTRAVENOUS | Status: AC
  Administered 2017-12-28: 100 mL
  Filled 2017-12-28: qty 100

## 2017-12-28 MED ORDER — ACETAMINOPHEN 325 MG PO TABS: 650.0000 mg | ORAL_TABLET | Freq: Four times a day (QID) | ORAL | Status: DC | PRN

## 2017-12-28 MED ORDER — ONDANSETRON HCL 4 MG/2ML IJ SOLN
4.0000 mg | Freq: Four times a day (QID) | INTRAMUSCULAR | Status: DC | PRN
  Filled 2017-12-28 (×3): qty 2

## 2017-12-28 MED ORDER — HYDRALAZINE HCL 20 MG/ML IJ SOLN
10.0000 mg | INTRAMUSCULAR | Status: DC | PRN
  Administered 2017-12-28: 10 mg via INTRAVENOUS
  Filled 2017-12-28: qty 1

## 2017-12-28 MED ORDER — SODIUM CHLORIDE 0.9 % IV SOLN
INTRAVENOUS | Status: AC
  Administered 2017-12-28: 22:00:00 via INTRAVENOUS

## 2017-12-28 MED ORDER — INSULIN ASPART 100 UNIT/ML ~~LOC~~ SOLN
0.0000 [IU] | SUBCUTANEOUS | Status: DC
  Administered 2017-12-29: 2 [IU] via SUBCUTANEOUS
  Administered 2017-12-29 (×2): 1 [IU] via SUBCUTANEOUS
  Administered 2017-12-30: 2 [IU] via SUBCUTANEOUS
  Administered 2017-12-30 (×3): 1 [IU] via SUBCUTANEOUS

## 2017-12-28 MED ORDER — ONDANSETRON HCL 4 MG/2ML IJ SOLN
4.0000 mg | Freq: Four times a day (QID) | INTRAMUSCULAR | Status: DC | PRN
  Administered 2017-12-28 – 2017-12-29 (×2): 4 mg via INTRAVENOUS
  Filled 2017-12-28: qty 2

## 2017-12-28 MED ORDER — ONDANSETRON HCL 4 MG PO TABS: 4.0000 mg | ORAL_TABLET | Freq: Four times a day (QID) | ORAL | Status: DC | PRN

## 2017-12-28 MED ORDER — LEVOTHYROXINE SODIUM 100 MCG/5ML IV SOLN
44.0000 ug | Freq: Every day | INTRAVENOUS | Status: DC
  Administered 2017-12-29 – 2017-12-30 (×2): 44 ug via INTRAVENOUS
  Filled 2017-12-28 (×2): qty 5

## 2017-12-28 MED ORDER — POTASSIUM CHLORIDE 10 MEQ/100ML IV SOLN
10.0000 meq | INTRAVENOUS | Status: AC
  Administered 2017-12-28 (×2): 10 meq via INTRAVENOUS
  Filled 2017-12-28 (×2): qty 100

## 2017-12-28 NOTE — ED Provider Notes (Signed)
[place] DEPT Provider Note   CSN: [id number] Arrival date & time: 12/28/17  1452     History   Chief Complaint Chief Complaint  Patient presents with  . Abdominal Pain  . Nausea  . Emesis    HPI Molly [name] is a 84 y.o. female.  She presents with worsening abdominal pain nausea and vomiting.  She is currently being worked up by Dr. Burr Medico from oncology for probable metastatic pancreatic cancer.  She was admitted at the end of November for abdominal pain and constipation.  She said she is mostly been on liquid since then and still try to take a fiber but for the past few days the pain is becoming more severe in her left lower quadrant and she now rates it 9 out of 10 crampy in nature.  She had one episode of vomiting nonbloody.  She said she moved her bowels a small amount yesterday.  She feels she has a bowel obstruction.  No fevers or chills.  No chest pain or shortness of breath.  The history is provided by the patient.  Abdominal Pain   This is a recurrent problem. The current episode started 2 days ago. The problem occurs constantly. The problem has been gradually worsening. The pain is associated with an unknown factor. The pain is located in the LLQ. The quality of the pain is cramping. The pain is at a severity of 9/10. Associated symptoms include nausea and vomiting. Pertinent negatives include fever, diarrhea, melena, dysuria, frequency and headaches. Nothing aggravates the symptoms. Nothing relieves the symptoms. Past workup includes GI consult and CT scan.  Emesis   Associated symptoms include abdominal pain. Pertinent negatives include no diarrhea, no fever and no headaches.    Past Medical History:  Diagnosis Date  . Cataract    removed both eyes  . Diabetes mellitus without complication ([place]) 06/10/6293   diet controlled   . Diverticulosis of colon   . Family history of cervical cancer   . Family history of colon cancer   . GERD  (gastroesophageal reflux disease)   . Glaucoma   . History of uterine cancer   . Hyperlipidemia   . Hypertension   . Kidney stones   . MALIGNANT NEOPLASM OF UTERUS PART UNSPECIFIED 1998  . Pancreatic cancer ([place])   . Thyroid disease    hypothyroidism    Patient Active Problem List   Diagnosis Date Noted  . Partial bowel obstruction ([place]) 12/18/2017  . History of uterine cancer   . Family history of colon cancer   . Family history of cervical cancer   . Pancreatic cancer ([place]) 12/14/2017  . Abnormal CT scan, colon   . Constipation 12/01/2017  . Protein calorie malnutrition ([place]) 12/01/2017  . Type 2 diabetes mellitus ([place]) 12/01/2017  . Pancreatic mass 11/15/2017  . Abnormal weight loss 11/11/2017  . 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 Diabetes mellitus without complication ([place]) 28/41/3244  . Health care maintenance 01/08/2017  . Dysuria 09/08/2016  . Easy bruising 08/04/2016  . Rectal pain 01/29/2015  . Fatigue 01/23/2015  . Dehydration 01/23/2015  . Post concussion syndrome 01/23/2015  . Numbness and tingling of left side of face   . Ruptured globe 01/21/2015  . Hypokalemia 01/21/2015  . Osteopenia 01/01/2015  . Vitamin D deficiency 11/25/2014  . Cough 10/01/2014  . Chronic RLQ pain 03/12/2014  . Advance care planning 11/24/2013  . Cystocele 11/24/2013  . Medicare annual wellness visit, subsequent [id number]  .  Glaucoma 1May 24, 202014  . Abdominal pain 10/25/2011  . Diverticulosis 08/18/2011  . Postmenopausal HRT (hormone replacement therapy) 07/18/2011  . Back pain 07/18/2011  . Shoulder pain 07/18/2011  . GERD 09/14/2008  . TIBIALIS TENDINITIS 07/30/2008  . PAIN IN JOINT, MULTIPLE SITES 06/02/2008  . INTERMITTENT CLAUDICATION, LEFT LEG 11/19/2007  . Hypothyroidism 05/28/2007  . HYPERLIPIDEMIA, MIXED 05/28/2007  . HTN (hypertension) 05/28/2007  . Disorder of bone and cartilage 05/28/2007  . IMPAIRED FASTING GLUCOSE 05/28/2007    Past Surgical History:    Procedure Laterality Date  . ABDOMINAL HYSTERECTOMY  12/22/1996   Uterine Cancer Dr Phineas Real  . BREAST BIOPSY  1996   Benign  . BREAST EXCISIONAL BIOPSY Left   . CHOLECYSTECTOMY    . COLONOSCOPY    . COLONOSCOPY WITH PROPOFOL N/A 12/13/2017   Procedure: COLONOSCOPY WITH PROPOFOL;  Surgeon: Milus Banister, MD;  Location: [facility];  Service: Endoscopy;  Laterality: N/A;  . Detached Retina Repair OS    . FACIAL LACERATION REPAIR Left 01/20/2015   Procedure: FACIAL LACERATION REPAIR;  Surgeon: Danice Goltz, MD;  Location: [facility];  Service: Ophthalmology;  Laterality: Left;  . Release R Long Finger A1 Pulley  01/23/2008   Dr Daylene Katayama  . RUPTURED GLOBE EXPLORATION AND REPAIR Left 01/20/2015   Procedure: REPAIR OF RUPTURED GLOBE and FACIAL LACERATIONS;  Surgeon: Danice Goltz, MD;  Location: [facility];  Service: Ophthalmology;  Laterality: Left;  . UPPER GASTROINTESTINAL ENDOSCOPY       OB History    Gravida  0   Para  0   Term  0   Preterm  0   AB  0   Living        SAB  0   TAB  0   Ectopic  0   Multiple      Live Births               Home Medications    Prior to Admission medications   Medication Sig Start Date End Date Taking? Authorizing Provider  acetaminophen (TYLENOL) 500 MG tablet Take 1 tablet (500 mg total) by mouth every 6 (six) hours as needed for mild pain or headache. 01/22/15   Dhungel, Flonnie Overman, MD  ALPRAZolam (XANAX) 0.25 MG tablet Take 1 tablet (0.25 mg total) by mouth at bedtime as needed for anxiety or sleep. 12/18/17   Truitt Merle, MD  atenolol (TENORMIN) 25 MG tablet Take 1 tablet ('25mg'$ ) by mouth daily 10/23/17   Tonia Ghent, MD  Biotin 10 MG CAPS Take 1 capsule by mouth daily.     [provider]  Black Cohosh-SoyIsoflav-Magnol (ESTROVEN MENOPAUSE RELIEF) CAPS Take by mouth.    [provider]  Blood Glucose Monitoring Suppl ([place]) w/Device KIT Use to check blood sugar once daily and  as needed.  Non insulin dependent  E11.9 07/16/17   Tonia Ghent, MD  brimonidine (ALPHAGAN P) 0.1 % SOLN Apply 1 drop to eye 2 (two) times daily.    [provider]  Cholecalciferol ([employer] VITAMIN D) 2000 UNITS CAPS Take 1 capsule (2,000 Units total) by mouth daily. 11/23/14   Tonia Ghent, MD  Coenzyme Q10 (COQ10 PO) Take 1 tablet by mouth daily.     [provider]  dorzolamide-timolol (COSOPT) 22.3-6.8 MG/ML ophthalmic solution Place 1 drop into the right eye 2 (two) times daily.     [provider]  glucose blood (ONETOUCH VERIO) test strip Use as instructed to  check blood sugar once daily and as needed.  Non insulin dependent.  E11.9 09/17/17   Tonia Ghent, MD  latanoprost (XALATAN) 0.005 % ophthalmic solution Place 1 drop into the right eye at bedtime.     [provider]  levothyroxine (SYNTHROID, LEVOTHROID) 88 MCG tablet Take 1 tablet by mouth daily 08/03/17   Tonia Ghent, MD  lidocaine (XYLOCAINE) 5 % ointment APPLY A SMALL AMOUNT TO RECTUM 3-4 TIMES PER DAY AS NEEDED 10/10/17   Tonia Ghent, MD  Multiple Vitamin (MULTIVITAMIN) tablet Take 1 tablet by mouth daily.      [provider]  Na Sulfate-K Sulfate-Mg Sulf 17.5-3.13-1.6 GM/177ML SOLN Suprep (no substitutions)-TAKE AS DIRECTED. 12/11/17   Milus Banister, MD  [facility] DELICA LANCETS 35K MISC Use to check blood sugar once daily and as needed.  Non insulin dependent.  E11.9 09/17/17   Tonia Ghent, MD  polyethylene glycol [facility] / Floria Raveling) packet Take 17 g by mouth daily. 12/05/17   Domenic Polite, MD    Family History Family History  Problem Relation Age of Onset  . Heart disease Father 64       MI  . Cancer Sister 52       Cervical Cancer  . Cancer Mother 76       gastric cancer- pt states stomach or  liver cancer - unsure which   . Colon cancer Maternal Aunt        dx 66s  . Cancer Maternal Grandmother        possible stomach cancer  . Diabetes Neg Hx     . Breast cancer Neg Hx   . Colon polyps Neg Hx   . Esophageal cancer Neg Hx   . Rectal cancer Neg Hx     Social History Social History   Tobacco Use  . Smoking status: Never Smoker  . Smokeless tobacco: Never Used  Substance Use Topics  . Alcohol use: No    Alcohol/week: 0.0 standard drinks  . Drug use: No     Allergies   Erythromycin; Ezetimibe-simvastatin; Latex; Macrodantin [nitrofurantoin macrocrystal]; Metformin and related; Morphine; Other; Petroleum jelly [skin protectants, misc.]; Preparation h [phenyleph-shark liv oil-mo-pet]; Procaine hcl; Simvastatin; Sulfamethoxazole; White petrolatum; Adhesive [tape]; and Tape   Review of Systems Review of Systems  Constitutional: Negative for fever.  HENT: Negative for sore throat.   Eyes: Negative for visual disturbance.  Respiratory: Negative for shortness of breath.   Cardiovascular: Negative for chest pain.  Gastrointestinal: Positive for abdominal pain, nausea and vomiting. Negative for diarrhea and melena.  Genitourinary: Negative for dysuria and frequency.  Musculoskeletal: Negative for neck pain.  Skin: Negative for rash.  Neurological: Negative for headaches.     Physical Exam Updated Vital Signs BP (!) 163/93 (BP Location: Right Arm)   Pulse 64   Temp 98.5 F (36.9 C) (Oral)   Resp 20   SpO2 92%   Physical Exam Vitals signs and nursing note reviewed.  Constitutional:      General: She is not in acute distress.    Appearance: She is well-developed.  HENT:     Head: Normocephalic and atraumatic.  Eyes:     Conjunctiva/sclera: Conjunctivae normal.  Neck:     Musculoskeletal: Neck supple.  Cardiovascular:     Rate and Rhythm: Normal rate and regular rhythm.     Heart sounds: No murmur.  Pulmonary:     Effort: Pulmonary effort is normal. No respiratory distress.     Breath  sounds: Normal breath sounds.  Abdominal:     Palpations: Abdomen is soft.     Tenderness: There is abdominal tenderness in  the left lower quadrant. There is no guarding or rebound.  Musculoskeletal: Normal range of motion.        General: No tenderness or signs of injury.  Skin:    General: Skin is warm and dry.     Capillary Refill: Capillary refill takes less than 2 seconds.  Neurological:     General: No focal deficit present.     Mental Status: She is alert.     Motor: No weakness.      ED Treatments / Results  Labs (all labs ordered are listed, but only abnormal results are displayed) Labs Reviewed  COMPREHENSIVE METABOLIC PANEL - Abnormal; Notable for the following components:      Result Value   Potassium 3.0 (*)    Glucose, Bld 150 (*)    All other components within normal limits  CBC - Abnormal; Notable for the following components:   HCT 46.8 (*)    All other components within normal limits  URINALYSIS, ROUTINE W REFLEX MICROSCOPIC - Abnormal; Notable for the following components:   Color, Urine STRAW (*)    Ketones, ur 20 (*)    All other components within normal limits  LIPASE, BLOOD  BASIC METABOLIC PANEL  CBC  HEPATIC FUNCTION PANEL  MAGNESIUM    EKG None  Radiology Ct Abdomen Pelvis W Contrast  Result Date: 12/28/2017 CLINICAL DATA:  84 year old female with known pancreatic cancer. Abdominal pain with nausea and vomiting since yesterday evening. No bowel movement in the past 2 days. EXAM: CT ABDOMEN AND PELVIS WITH CONTRAST TECHNIQUE: Multidetector CT imaging of the abdomen and pelvis was performed using the standard protocol following bolus administration of intravenous contrast. CONTRAST:  151m ISOVUE-300 IOPAMIDOL (ISOVUE-300) INJECTION 61% COMPARISON:  CT the abdomen and pelvis 12/01/2017. FINDINGS: Lower chest: New 7 x 5 mm (mean diameter of 6 mm) nodule in the medial aspect of the right lower lobe (axial image 29 of series 4). Areas of bronchiectasis and scarring in the inferior segment of the lingula. Hepatobiliary: No suspicious cystic or solid hepatic lesions. Mild  intrahepatic biliary ductal dilatation, similar to the prior study. Common bile duct measures 10 mm in the porta hepatis. Status post cholecystectomy. Pancreas: In the body of the pancreas there is again a large hypovascular mass measuring 3.5 x 5.0 cm (axial image 25 of series 2). This is associated with proximal pancreatic ductal dilatation throughout the distal body and tail of the pancreas. Spleen: Several well-defined low-attenuation lesions in the spleen are stable in size and appearance to the prior study, and although not characterized are favored to be benign. Adrenals/Urinary Tract: Low-attenuation lesions in the left kidney compatible with simple cysts, largest of which measures 1.7 cm in the lower pole of the left kidney. Other subcentimeter low-attenuation lesions are noted in the left kidney, too small to characterize, but favored to represent tiny cysts. Right kidney and bilateral adrenal glands are normal in appearance. No hydroureteronephrosis. Urinary bladder is normal in appearance. Stomach/Bowel: Normal appearance of the stomach. No pathologic dilatation of small bowel. Mild dilatation of the colon, most evident in the region of the cecum measuring 7.4 cm in diameter. Multiple air-fluid levels noted throughout the colon. There is extensive asymmetric mural thickening and enhancement in the distal sigmoid colon and proximal rectum, best appreciated on axial images 67 and 62 of series 2, highly concerning  for primary colorectal neoplasm. Some thickening of the mesorectal fascia is noted on the left side measuring up to 1.6 x 0.7 cm (axial image 68 of series 2). Vascular/Lymphatic: Aortic atherosclerosis, without evidence of aneurysm or dissection in the abdominal or pelvic vasculature. Previously described pancreatic mass is intimately associated with the superior mesenteric vein, splenic vein and splenoportal confluence, which appear mildly narrowed by the lesion. There is an intact fat plane  between the lesion and the superior mesenteric artery. No involvement of the celiac axis, although the splenic artery and common hepatic artery both come in close proximity to the lesion without discrete intervening fat plane. Several borderline enlarged and mildly enlarged left inguinal lymph nodes are noted measuring up to 12 mm in short axis (axial image 68 of series 2). Reproductive: Status post hysterectomy. Ovaries are not confidently identified may be surgically absent or atrophic. Other: Trace volume of ascites. No pneumoperitoneum. Multiple enhancing peritoneal nodules are again noted, largest of which are in the anterior aspect of the mid abdomen measuring up to 1.0 x 1.6 cm and 1.6 x 1.3 cm on axial images 50 and 49 of series 2 respectively. Some of these soft tissue nodules extend into the omental fat within a small umbilical hernia. Musculoskeletal: There are no aggressive appearing lytic or blastic lesions noted in the visualized portions of the skeleton. IMPRESSION: 1. Mild dilatation of the colon with multiple air-fluid levels suggestive of very mild distal obstruction related to enlarging colorectal neoplasm involving distal sigmoid colon and proximal rectum, as discussed above. Some adjacent thickening of the mesorectal fascia on the left side concerning for local metastatic disease. 2. Previously noted pancreatic mass is slightly larger than the prior examination, currently measuring 5.0 x 3.5 cm. Peritoneal metastatic disease appears slightly progressive compared to the prior study with slight enlargement of peritoneal implants. There is also some new left inguinal lymphadenopathy which also appears slightly progressive compared to the prior study. 3. New 7 x 5 mm nodule in the medial aspect of the right lower lobe. This is nonspecific and may be infectious or inflammatory, however, the possibility of metastatic disease to the right lower lobe should be considered, and close attention on  follow-up studies is recommended. 4. Additional incidental findings, as above. Electronically Signed   By: Vinnie Langton M.D.   On: 12/28/2017 18:57    Procedures Procedures (including critical care time)  Medications Ordered in ED Medications  sodium chloride 0.9 % bolus 1,000 mL (has no administration in time range)  ondansetron (ZOFRAN) injection 4 mg (has no administration in time range)  fentaNYL (SUBLIMAZE) injection 50 mcg (has no administration in time range)     Initial Impression / Assessment and Plan / ED Course  I have reviewed the triage vital signs and the nursing notes.  Pertinent labs & imaging results that were available during my care of the patient were reviewed by me and considered in my medical decision making (see chart for details).  Clinical Course as of Dec 29 2342  Fri Dec 28, 2017  1656 Dr. Annamaria Boots from oncology down to see the patient.  She said that Dr. Bayard Males had tried to do a colonoscopy on before but he could not get past her stricture.  She is going to put a message into him as they were considering trying to stent this area.  She agrees with current management of IV fluids and likely will need admission for bowel rest.   [MB]  1929 Discussed with Dr.  Devona Konig from the hospitalist service will admit the patient for further evaluation.   [MB]    Clinical Course User Index [MB] Hayden Rasmussen, MD      Final Clinical Impressions(s) / ED Diagnoses   Final diagnoses:  Partial intestinal obstruction, unspecified cause ([place])  Malignant neoplasm of pancreas, unspecified location of malignancy [facility])  Hypokalemia    ED Discharge Orders    None       Hayden Rasmussen, MD 12/28/17 2344

## 2017-12-28 NOTE — ED Triage Notes (Signed)
Pt has pancreatic cancer but hasnt started treatments yet. Had recent bowel blockage. Reports that she started having abd pains with n/v last night.

## 2017-12-28 NOTE — Progress Notes (Signed)
Molly [name]   DOB:1929/06/23   HW#:[id number]   VEL#:[id number]  Oncology f/u note   Subjective: Patient is well-known to me, under my care for her metastatic pancreatic cancer.  She is under supportive care only.  She also has severe stricture in the sigmoid colon, with unknown etiology, she presented to [facility] emergency room today with nausea, vomiting, abdominal pain, especially in the left lower quadrant, and no bowel movement since yesterday.  She denies fever or chills, she has not been able to eat much since yesterday.   Objective:  Vitals:   12/28/17 1456 12/28/17 1722  BP: (!) 163/93 (!) 173/84  Pulse: 64 75  Resp: 20 17  Temp: 98.5 F (36.9 C)   SpO2: 92% 99%    There is no height or weight on file to calculate BMI. No intake or output data in the 24 hours ending 12/28/17 1731   Sclerae unicteric  Oropharynx clear, dry mucosa   No peripheral adenopathy  Lungs clear -- no rales or rhonchi  Heart regular rate and rhythm  Abdomen soft, with diffuse mild tenderness, worse in the left lower quadrant, no rebound pain.  Bowel sounds is active.  MSK no focal spinal tenderness, no peripheral edema  Neuro nonfocal  CBG (last 3)  No results for input(s): GLUCAP in the last 72 hours.   Labs:  Lab Results  Component Value Date   WBC 8.7 12/28/2017   HGB 14.8 12/28/2017   HCT 46.8 (H) 12/28/2017   MCV 97.9 12/28/2017   PLT 263 12/28/2017   NEUTROABS 3.0 12/18/2017   CMP Latest Ref Rng & Units 12/28/2017 12/18/2017 12/04/2017  Glucose 70 - 99 mg/dL 150(H) 196(H) 125(H)  BUN 8 - 23 mg/dL 11 9 7(L)  Creatinine 0.44 - 1.00 mg/dL 0.61 0.71 0.58  Sodium 135 - 145 mmol/L 141 143 139  Potassium 3.5 - 5.1 mmol/L 3.0(L) 3.5 3.8  Chloride 98 - 111 mmol/L 103 103 106  CO2 22 - 32 mmol/L 26 33(H) 21(L)  Calcium 8.9 - 10.3 mg/dL 9.1 9.2 8.9  Total Protein 6.5 - 8.1 g/dL 6.9 6.3(L) -  Total Bilirubin 0.3 - 1.2 mg/dL 0.6 0.4 -  Alkaline Phos 38 - 126 U/L 70 73 -  AST 15 -  41 U/L 20 17 -  ALT 0 - 44 U/L 16 14 -      Urine Studies No results for input(s): UHGB, CRYS in the last 72 hours.  Invalid input(s): UACOL, UAPR, USPG, UPH, UTP, UGL, UKET, UBIL, UNIT, UROB, ULEU, UEPI, UWBC, URBC, UBAC, CAST, UCOM, BILUA  Basic Metabolic Panel: Recent Labs  Lab 12/28/17 1546  NA 141  K 3.0*  CL 103  CO2 26  GLUCOSE 150*  BUN 11  CREATININE 0.61  CALCIUM 9.1   GFR Estimated Creatinine Clearance: 39 mL/min (by C-G formula based on SCr of 0.61 mg/dL). Liver Function Tests: Recent Labs  Lab 12/28/17 1546  AST 20  ALT 16  ALKPHOS 70  BILITOT 0.6  PROT 6.9  ALBUMIN 3.5   Recent Labs  Lab 12/28/17 1546  LIPASE 24   No results for input(s): AMMONIA in the last 168 hours. Coagulation profile No results for input(s): INR, PROTIME in the last 168 hours.  CBC: Recent Labs  Lab 12/28/17 1546  WBC 8.7  HGB 14.8  HCT 46.8*  MCV 97.9  PLT 263   Cardiac Enzymes: No results for input(s): CKTOTAL, CKMB, CKMBINDEX, TROPONINI in the last 168 hours. BNP: Invalid input(s):  POCBNP CBG: No results for input(s): GLUCAP in the last 168 hours. D-Dimer No results for input(s): DDIMER in the last 72 hours. Hgb A1c No results for input(s): HGBA1C in the last 72 hours. Lipid Profile No results for input(s): CHOL, HDL, LDLCALC, TRIG, CHOLHDL, LDLDIRECT in the last 72 hours. Thyroid function studies No results for input(s): TSH, T4TOTAL, T3FREE, THYROIDAB in the last 72 hours.  Invalid input(s): FREET3 Anemia work up No results for input(s): VITAMINB12, FOLATE, FERRITIN, TIBC, IRON, RETICCTPCT in the last 72 hours. Microbiology No results found for this or any previous visit (from the past 240 hour(s)).    Studies:  No results found.  Assessment: 84 y.o. with recently diagnosed metastatic pancreatic cancer to peritoneum, severe sigmoid colon stricture and bowel obstruction, presented with recurrent nausea, vomiting, abdominal pain and no bowel  movement since yesterday.  1.  Bowel obstruction secondary to severe sigmoid colon stricture, unknown etiology, benign versus secondary to peritoneal metastasis 2.  Pancreatic adenocarcinoma with peritoneal metastasis, on supportive care alone. 3. DM 4.  Hypokalemia 5.  Moderate protein and calorie malnutrition  Plan:  -Her symptoms is highly concerning for recurrent colonic bowel obstruction secondary to the known sigmoid colon stricture -She will have a CT abdomen scan for further evaluation in ED  -She will likely to be admitted to [facility] hospital for further management, with NPO, IVF and symptomatic management  -Her bowel obstruction issue has been discussed with GI Drs Ardis Hughs and [place] and colorectal surgeons previously.  She is a poor candidate for diverting colostomy due to her peritoneal metastasis, advanced age. Dr. Rush Landmark maybe able to try colonic stent placement, but feels she is at high risk for perforation from the stent placement due to her diverticulosis. I have sent a message to Dr. Carmin Muskrat and on call GI Dr. Collene Mares to see if they can weight in the management.  -We have previously discussed palliative care for her pancreatic cancer, and we decided not to pursue chemotherapy due to her advanced age and limited performance status.  She is under palliative care for that now, but not ready for hospice. -please consult palliative care also over the weekend, to discuss goal of care.  -I will f/u on Monday if she remains to be hospitalized then.   Truitt Merle, MD 12/28/2017  5:31 PM

## 2017-12-28 NOTE — Telephone Encounter (Signed)
Patient's husband Trilby Drummer called, she is not doing well, he may have to bring her into the hospital.  I called back, and left a message on their home phone, and no answers from his cell phone.  Truitt Merle  12/28/2017

## 2017-12-28 NOTE — ED Notes (Signed)
Transport called to take patient upstairs 

## 2017-12-28 NOTE — H&P (Addendum)
History and Physical    Molly [name] CBU:[id number] DOB: [birth year] DOA: 12/28/2017  PCP: Tonia Ghent, MD  Patient coming from: Home.  Chief Complaint: Abdominal pain with nausea vomiting.  HPI: Molly [name] is a 84 y.o. female with history of pancreatic cancer metastatic and the supportive care, hypertension, hypothyroidism presents to the ER with complaints of increasing abdominal pain with nausea vomiting over the last 2 days.  Pain is mostly in the left lower quadrant.  Has not moved bowels for last 2 days.  Patient has a known history of stricture in the sigmoid area of unknown cause.  ED Course: CT of the abdomen and pelvis done in the ER shows mild colon dilatation with multiple air-fluid levels with distal bowel obstruction colorectal neoplasm.  Patient oncologist has already seen the patient and has requested GI consult with Dr. Mann/Dr. Rush Landmark for possible stent placement.  Review of Systems: As per HPI, rest all negative.   Past Medical History:  Diagnosis Date  . Cataract    removed both eyes  . Diabetes mellitus without complication ([place]) 0/03/2120   diet controlled   . Diverticulosis of colon   . Family history of cervical cancer   . Family history of colon cancer   . GERD (gastroesophageal reflux disease)   . Glaucoma   . History of uterine cancer   . Hyperlipidemia   . Hypertension   . Kidney stones   . MALIGNANT NEOPLASM OF UTERUS PART UNSPECIFIED 1998  . Pancreatic cancer ([place])   . Thyroid disease    hypothyroidism    Past Surgical History:  Procedure Laterality Date  . ABDOMINAL HYSTERECTOMY  12/22/1996   Uterine Cancer Dr Phineas Real  . BREAST BIOPSY  1996   Benign  . BREAST EXCISIONAL BIOPSY Left   . CHOLECYSTECTOMY    . COLONOSCOPY    . COLONOSCOPY WITH PROPOFOL N/A 12/13/2017   Procedure: COLONOSCOPY WITH PROPOFOL;  Surgeon: Milus Banister, MD;  Location: [facility];  Service: Endoscopy;  Laterality: N/A;  . Detached Retina  Repair OS    . FACIAL LACERATION REPAIR Left 01/20/2015   Procedure: FACIAL LACERATION REPAIR;  Surgeon: Danice Goltz, MD;  Location: [place];  Service: Ophthalmology;  Laterality: Left;  . Release R Long Finger A1 Pulley  01/23/2008   Dr Daylene Katayama  . RUPTURED GLOBE EXPLORATION AND REPAIR Left 01/20/2015   Procedure: REPAIR OF RUPTURED GLOBE and FACIAL LACERATIONS;  Surgeon: Danice Goltz, MD;  Location: [place];  Service: Ophthalmology;  Laterality: Left;  . UPPER GASTROINTESTINAL ENDOSCOPY       reports that she has never smoked. She has never used smokeless tobacco. She reports that she does not drink alcohol or use drugs.  Allergies  Allergen Reactions  . Erythromycin     Intolerant  . Ezetimibe-Simvastatin     REACTION: MUSCLE ACHES AND PAINS.   Marland Kitchen Latex Swelling    "thrush" type rash and swelling  . Macrodantin [Nitrofurantoin Macrocrystal] Nausea Only  . Metformin And Related Other (See Comments)    Abdominal pain with short and long acting metformin.    Marland Kitchen Morphine     Chest pain  . Other Other (See Comments)    novacaine-sick  . Petroleum Jelly [Skin Protectants, Misc.] Other (See Comments)    Skin irritation.   Marland Kitchen Preparation H [Phenyleph-Shark Liv Oil-Mo-Pet] Other (See Comments)    Local irritation, burning.   . Procaine Hcl     Can tolerate lidocaine  .  Simvastatin     myalgias  . Sulfamethoxazole Other (See Comments)    Does not agree with her  . White Petrolatum Other (See Comments)    Skin irritation.   . Adhesive [Tape] Rash  . Tape Rash    Rash Rash    Family History  Problem Relation Age of Onset  . Heart disease Father 48       MI  . Cancer Sister 73       Cervical Cancer  . Cancer Mother 29       gastric cancer- pt states stomach or  liver cancer - unsure which   . Colon cancer Maternal Aunt        dx 74s  . Cancer Maternal Grandmother        possible stomach cancer  . Diabetes Neg Hx   . Breast cancer Neg Hx   . Colon polyps Neg  Hx   . Esophageal cancer Neg Hx   . Rectal cancer Neg Hx     Prior to Admission medications   Medication Sig Start Date End Date Taking? Authorizing Provider  atenolol (TENORMIN) 25 MG tablet Take 1 tablet ('25mg'$ ) by mouth daily 10/23/17  Yes Tonia Ghent, MD  Biotin 10 MG CAPS Take 1 capsule by mouth daily.    Yes [provider]  Black Cohosh-SoyIsoflav-Magnol (ESTROVEN MENOPAUSE RELIEF) CAPS Take 1 capsule by mouth daily.    Yes [provider]  Blood Glucose Monitoring Suppl ([place]) w/Device KIT Use to check blood sugar once daily and as needed.  Non insulin dependent  E11.9 07/16/17  Yes Tonia Ghent, MD  Cholecalciferol ([employer] VITAMIN D) 2000 UNITS CAPS Take 1 capsule (2,000 Units total) by mouth daily. 11/23/14  Yes Tonia Ghent, MD  Coenzyme Q10 (COQ10 PO) Take 1 tablet by mouth daily.    Yes [provider]  glucose blood (ONETOUCH VERIO) test strip Use as instructed to check blood sugar once daily and as needed.  Non insulin dependent.  E11.9 09/17/17  Yes Tonia Ghent, MD  levothyroxine Wilmer Floor, LEVOTHROID) 88 MCG tablet Take 1 tablet by mouth daily 08/03/17  Yes Tonia Ghent, MD  lidocaine (XYLOCAINE) 5 % ointment APPLY A SMALL AMOUNT TO RECTUM 3-4 TIMES PER DAY AS NEEDED Patient taking differently: Apply 1 application topically 3 (three) times daily as needed for mild pain. APPLY A SMALL AMOUNT TO RECTUM 3-4 TIMES PER DAY AS NEEDED 10/10/17  Yes Tonia Ghent, MD  Multiple Vitamin (MULTIVITAMIN) tablet Take 1 tablet by mouth daily.     Yes [provider]  Na Sulfate-K Sulfate-Mg Sulf 17.5-3.13-1.6 GM/177ML SOLN Suprep (no substitutions)-TAKE AS DIRECTED. 12/11/17  Yes Milus Banister, MD  [facility] DELICA LANCETS 10U MISC Use to check blood sugar once daily and as needed.  Non insulin dependent.  E11.9 09/17/17  Yes Tonia Ghent, MD  polyethylene glycol [facility] / Floria Raveling) packet Take 17 g by mouth daily.  12/05/17  Yes Domenic Polite, MD  acetaminophen (TYLENOL) 500 MG tablet Take 1 tablet (500 mg total) by mouth every 6 (six) hours as needed for mild pain or headache. Patient not taking: Reported on 12/28/2017 01/22/15   Dhungel, Flonnie Overman, MD  ALPRAZolam Duanne Moron) 0.25 MG tablet Take 1 tablet (0.25 mg total) by mouth at bedtime as needed for anxiety or sleep. 12/18/17   Truitt Merle, MD    Physical Exam: Vitals:   12/28/17 1845 12/28/17 1900 12/28/17 1915 12/28/17 2055  BP: (!) 169/82 (!) 185/86 (!) 187/86 (!) 176/90  Pulse: 79 79 79 74  Resp: 13 (!) 23 (!) 22 19  Temp:    99.4 F (37.4 C)  TempSrc:    Oral  SpO2: 95% 94% 96% 97%  Weight:    48.7 kg  Height:    '5\' 2"'$  (1.575 m)      Constitutional: Moderately built and nourished. Vitals:   12/28/17 1845 12/28/17 1900 12/28/17 1915 12/28/17 2055  BP: (!) 169/82 (!) 185/86 (!) 187/86 (!) 176/90  Pulse: 79 79 79 74  Resp: 13 (!) 23 (!) 22 19  Temp:    99.4 F (37.4 C)  TempSrc:    Oral  SpO2: 95% 94% 96% 97%  Weight:    48.7 kg  Height:    '5\' 2"'$  (1.575 m)   Eyes: Anicteric no pallor. ENMT: No discharge from the ears eyes nose or mouth. Neck: No mass felt.  No neck rigidity. Respiratory: No rhonchi or crepitations. Cardiovascular: S1-S2 heard. Abdomen: Soft mild tenderness in the left lower quadrant.  Bowel sounds not appreciated. Musculoskeletal: No edema. Skin: No rash. Neurologic: Alert awake oriented to time place and person.  Moves all extremities. Psychiatric: Appears normal.   Labs on Admission: I have personally reviewed following labs and imaging studies  CBC: Recent Labs  Lab 12/28/17 1546  WBC 8.7  HGB 14.8  HCT 46.8*  MCV 97.9  PLT 295   Basic Metabolic Panel: Recent Labs  Lab 12/28/17 1546  NA 141  K 3.0*  CL 103  CO2 26  GLUCOSE 150*  BUN 11  CREATININE 0.61  CALCIUM 9.1   GFR: Estimated Creatinine Clearance: 38.8 mL/min (by C-G formula based on SCr of 0.61 mg/dL). Liver Function  Tests: Recent Labs  Lab 12/28/17 1546  AST 20  ALT 16  ALKPHOS 70  BILITOT 0.6  PROT 6.9  ALBUMIN 3.5   Recent Labs  Lab 12/28/17 1546  LIPASE 24   No results for input(s): AMMONIA in the last 168 hours. Coagulation Profile: No results for input(s): INR, PROTIME in the last 168 hours. Cardiac Enzymes: No results for input(s): CKTOTAL, CKMB, CKMBINDEX, TROPONINI in the last 168 hours. BNP (last 3 results) No results for input(s): PROBNP in the last 8760 hours. HbA1C: No results for input(s): HGBA1C in the last 72 hours. CBG: No results for input(s): GLUCAP in the last 168 hours. Lipid Profile: No results for input(s): CHOL, HDL, LDLCALC, TRIG, CHOLHDL, LDLDIRECT in the last 72 hours. Thyroid Function Tests: No results for input(s): TSH, T4TOTAL, FREET4, T3FREE, THYROIDAB in the last 72 hours. Anemia Panel: No results for input(s): VITAMINB12, FOLATE, FERRITIN, TIBC, IRON, RETICCTPCT in the last 72 hours. Urine analysis:    Component Value Date/Time   COLORURINE STRAW (A) 12/28/2017 1638   APPEARANCEUR CLEAR 12/28/2017 1638   LABSPEC 1.008 12/28/2017 1638   PHURINE 7.0 12/28/2017 1638   GLUCOSEU NEGATIVE 12/28/2017 1638   HGBUR NEGATIVE 12/28/2017 1638   HGBUR negative 06/23/2008 0818   BILIRUBINUR NEGATIVE 12/28/2017 1638   BILIRUBINUR Neg 09/08/2016 1249   KETONESUR 20 (A) 12/28/2017 1638   PROTEINUR NEGATIVE 12/28/2017 1638   UROBILINOGEN 0.2 09/08/2016 1249   UROBILINOGEN 0.2 03/11/2014 1134   NITRITE NEGATIVE 12/28/2017 1638   LEUKOCYTESUR NEGATIVE 12/28/2017 1638   Sepsis Labs: '@LABRCNTIP'$ (procalcitonin:4,lacticidven:4) )No results found for this or any previous visit (from the past 240 hour(s)).   Radiological Exams on Admission: Ct Abdomen Pelvis W Contrast  Result Date: 12/28/2017 CLINICAL  DATA:  84 year old female with known pancreatic cancer. Abdominal pain with nausea and vomiting since yesterday evening. No bowel movement in the past 2 days. EXAM:  CT ABDOMEN AND PELVIS WITH CONTRAST TECHNIQUE: Multidetector CT imaging of the abdomen and pelvis was performed using the standard protocol following bolus administration of intravenous contrast. CONTRAST:  171m ISOVUE-300 IOPAMIDOL (ISOVUE-300) INJECTION 61% COMPARISON:  CT the abdomen and pelvis 12/01/2017. FINDINGS: Lower chest: New 7 x 5 mm (mean diameter of 6 mm) nodule in the medial aspect of the right lower lobe (axial image 29 of series 4). Areas of bronchiectasis and scarring in the inferior segment of the lingula. Hepatobiliary: No suspicious cystic or solid hepatic lesions. Mild intrahepatic biliary ductal dilatation, similar to the prior study. Common bile duct measures 10 mm in the porta hepatis. Status post cholecystectomy. Pancreas: In the body of the pancreas there is again a large hypovascular mass measuring 3.5 x 5.0 cm (axial image 25 of series 2). This is associated with proximal pancreatic ductal dilatation throughout the distal body and tail of the pancreas. Spleen: Several well-defined low-attenuation lesions in the spleen are stable in size and appearance to the prior study, and although not characterized are favored to be benign. Adrenals/Urinary Tract: Low-attenuation lesions in the left kidney compatible with simple cysts, largest of which measures 1.7 cm in the lower pole of the left kidney. Other subcentimeter low-attenuation lesions are noted in the left kidney, too small to characterize, but favored to represent tiny cysts. Right kidney and bilateral adrenal glands are normal in appearance. No hydroureteronephrosis. Urinary bladder is normal in appearance. Stomach/Bowel: Normal appearance of the stomach. No pathologic dilatation of small bowel. Mild dilatation of the colon, most evident in the region of the cecum measuring 7.4 cm in diameter. Multiple air-fluid levels noted throughout the colon. There is extensive asymmetric mural thickening and enhancement in the distal sigmoid colon  and proximal rectum, best appreciated on axial images 67 and 62 of series 2, highly concerning for primary colorectal neoplasm. Some thickening of the mesorectal fascia is noted on the left side measuring up to 1.6 x 0.7 cm (axial image 68 of series 2). Vascular/Lymphatic: Aortic atherosclerosis, without evidence of aneurysm or dissection in the abdominal or pelvic vasculature. Previously described pancreatic mass is intimately associated with the superior mesenteric vein, splenic vein and splenoportal confluence, which appear mildly narrowed by the lesion. There is an intact fat plane between the lesion and the superior mesenteric artery. No involvement of the celiac axis, although the splenic artery and common hepatic artery both come in close proximity to the lesion without discrete intervening fat plane. Several borderline enlarged and mildly enlarged left inguinal lymph nodes are noted measuring up to 12 mm in short axis (axial image 68 of series 2). Reproductive: Status post hysterectomy. Ovaries are not confidently identified may be surgically absent or atrophic. Other: Trace volume of ascites. No pneumoperitoneum. Multiple enhancing peritoneal nodules are again noted, largest of which are in the anterior aspect of the mid abdomen measuring up to 1.0 x 1.6 cm and 1.6 x 1.3 cm on axial images 50 and 49 of series 2 respectively. Some of these soft tissue nodules extend into the omental fat within a small umbilical hernia. Musculoskeletal: There are no aggressive appearing lytic or blastic lesions noted in the visualized portions of the skeleton. IMPRESSION: 1. Mild dilatation of the colon with multiple air-fluid levels suggestive of very mild distal obstruction related to enlarging colorectal neoplasm involving distal sigmoid colon  and proximal rectum, as discussed above. Some adjacent thickening of the mesorectal fascia on the left side concerning for local metastatic disease. 2. Previously noted pancreatic  mass is slightly larger than the prior examination, currently measuring 5.0 x 3.5 cm. Peritoneal metastatic disease appears slightly progressive compared to the prior study with slight enlargement of peritoneal implants. There is also some new left inguinal lymphadenopathy which also appears slightly progressive compared to the prior study. 3. New 7 x 5 mm nodule in the medial aspect of the right lower lobe. This is nonspecific and may be infectious or inflammatory, however, the possibility of metastatic disease to the right lower lobe should be considered, and close attention on follow-up studies is recommended. 4. Additional incidental findings, as above. Electronically Signed   By: Vinnie Langton M.D.   On: 12/28/2017 18:57     Assessment/Plan Principal Problem:   Partial bowel obstruction (HCC) Active Problems:   Hypothyroidism   HTN (hypertension)   Hypokalemia   Type 2 diabetes mellitus ([place])   Pancreatic cancer (HCC)   Bowel obstruction ([place])    1. Bowel obstruction with distal sigmoid colonic mass -appreciated oncology input.  Gastroenterology has been consulted for possible stent placement.  Patient will be kept n.p.o. and if patient has further vomiting may need NG tube.  Hydration. 2. Hypokalemia likely from vomiting replace recheck. 3. Uncontrolled hypertension we will keep patient on PRN IV hydralazine for now. 4. Hypothyroidism we will keep patient on IV Synthroid replacement until patient can eat. 5. Metastatic pancreatic cancer under supportive care further recommendations per oncologist.   DVT prophylaxis: SCDs. Code Status: Full code. Family Communication: Patient husband was at the bedside. Disposition Plan: Home. Consults called: GI by Dr. Burr Medico patient's oncologist. Admission status: Inpatient.   [name] MD [facility] Pager [phone].  If 7PM-7AM, please contact night-coverage [email or web address] Password [facility]  12/28/2017, 11:33 PM

## 2017-12-28 NOTE — ED Notes (Signed)
ED TO INPATIENT HANDOFF REPORT  Name/Age/Gender Molly [name] 84 y.o. female  Code Status Code Status History    Date Active Date Inactive Code Status Order ID Comments User Context   12/01/2017 2137 12/04/2017 1547 Full Code [id number]  Shela Leff, MD ED   01/23/2015 0415 01/24/2015 1613 Full Code [id number]  Edwin Dada, MD Inpatient   01/21/2015 0337 01/22/2015 1511 Full Code [id number]  Danford, Suann Larry, MD Inpatient    Advance Directive Documentation     Most Recent Value  Type of Advance Directive  Healthcare Power of Attorney, Living will  Pre-existing out of facility DNR order (yellow form or pink MOST form)  -  "MOST" Form in Place?  -      Home/SNF/Other Home  Chief Complaint abdominal pain  Level of Care/Admitting Diagnosis ED Disposition    ED Disposition Condition [place]: [facility] [[id number]]  Level of Care: Med-Surg [16]  Diagnosis: Bowel obstruction [facility]) [[id number]]  Admitting Physician: [name] [phone]  Attending Physician: [name] [phone]  Estimated length of stay: past midnight tomorrow  Certification:: I certify this patient will need inpatient services for at least 2 midnights  PT Class (Do Not Modify): Inpatient [101]  PT Acc Code (Do Not Modify): Private [1]       Medical History Past Medical History:  Diagnosis Date  . Cataract    removed both eyes  . Diabetes mellitus without complication ([place]) 07/11/8919   diet controlled   . Diverticulosis of colon   . Family history of cervical cancer   . Family history of colon cancer   . GERD (gastroesophageal reflux disease)   . Glaucoma   . History of uterine cancer   . Hyperlipidemia   . Hypertension   . Kidney stones   . MALIGNANT NEOPLASM OF UTERUS PART UNSPECIFIED 1998  . Pancreatic cancer ([place])   . Thyroid disease    hypothyroidism    Allergies Allergies  Allergen Reactions  . Erythromycin    Intolerant  . Ezetimibe-Simvastatin     REACTION: MUSCLE ACHES AND PAINS.   Marland Kitchen Latex Swelling    "thrush" type rash and swelling  . Macrodantin [Nitrofurantoin Macrocrystal] Nausea Only  . Metformin And Related Other (See Comments)    Abdominal pain with short and long acting metformin.    Marland Kitchen Morphine     Chest pain  . Other Other (See Comments)    novacaine-sick  . Petroleum Jelly [Skin Protectants, Misc.] Other (See Comments)    Skin irritation.   Marland Kitchen Preparation H [Phenyleph-Shark Liv Oil-Mo-Pet] Other (See Comments)    Local irritation, burning.   . Procaine Hcl     Can tolerate lidocaine  . Simvastatin     myalgias  . Sulfamethoxazole Other (See Comments)    Does not agree with her  . White Petrolatum Other (See Comments)    Skin irritation.   . Adhesive [Tape] Rash  . Tape Rash    Rash Rash    IV Location/Drains/Wounds Patient Lines/Drains/Airways Status   Active Line/Drains/Airways    Name:   Placement date:   Placement time:   Site:   Days:   Peripheral IV 12/28/17 Left Antecubital   12/28/17    1542    Antecubital   less than 1   External Urinary Catheter   12/01/17    1639    -   27   Incision (Closed) 01/21/15 Eye Left  01/21/15    0148     1072          Labs/Imaging Results for orders placed or performed during the hospital encounter of 12/28/17 (from the past 48 hour(s))  Lipase, blood     Status: None   Collection Time: 12/28/17  3:46 PM  Result Value Ref Range   Lipase 24 11 - 51 U/L    Comment: Performed at [facility], [place] [address], [place], [place] [address]  Comprehensive metabolic panel     Status: Abnormal   Collection Time: 12/28/17  3:46 PM  Result Value Ref Range   Sodium 141 135 - 145 mmol/L   Potassium 3.0 (L) 3.5 - 5.1 mmol/L   Chloride 103 98 - 111 mmol/L   CO2 26 22 - 32 mmol/L   Glucose, Bld 150 (H) 70 - 99 mg/dL   BUN 11 8 - 23 mg/dL   Creatinine, Ser 0.61 0.44 - 1.00 mg/dL   Calcium 9.1 8.9 - 10.3 mg/dL    Total Protein 6.9 6.5 - 8.1 g/dL   Albumin 3.5 3.5 - 5.0 g/dL   AST 20 15 - 41 U/L   ALT 16 0 - 44 U/L   Alkaline Phosphatase 70 38 - 126 U/L   Total Bilirubin 0.6 0.3 - 1.2 mg/dL   GFR calc non Af Amer >60 >60 mL/min   GFR calc Af Amer >60 >60 mL/min   Anion gap 12 5 - 15    Comment: Performed at [facility], [place] [address] [place], [place] [address]  CBC     Status: Abnormal   Collection Time: 12/28/17  3:46 PM  Result Value Ref Range   WBC 8.7 4.0 - 10.5 K/uL   RBC 4.78 3.87 - 5.11 MIL/uL   Hemoglobin 14.8 12.0 - 15.0 g/dL   HCT 46.8 (H) 36.0 - 46.0 %   MCV 97.9 80.0 - 100.0 fL   MCH 31.0 26.0 - 34.0 pg   MCHC 31.6 30.0 - 36.0 g/dL   RDW 13.2 11.5 - 15.5 %   Platelets 263 150 - 400 K/uL   nRBC 0.0 0.0 - 0.2 %    Comment: Performed at [facility], [place] [address] [place], [place] [address]  Urinalysis, Routine w reflex microscopic     Status: Abnormal   Collection Time: 12/28/17  4:38 PM  Result Value Ref Range   Color, Urine STRAW (A) YELLOW   APPearance CLEAR CLEAR   Specific Gravity, Urine 1.008 1.005 - 1.030   pH 7.0 5.0 - 8.0   Glucose, UA NEGATIVE NEGATIVE mg/dL   Hgb urine dipstick NEGATIVE NEGATIVE   Bilirubin Urine NEGATIVE NEGATIVE   Ketones, ur 20 (A) NEGATIVE mg/dL   Protein, ur NEGATIVE NEGATIVE mg/dL   Nitrite NEGATIVE NEGATIVE   Leukocytes, UA NEGATIVE NEGATIVE    Comment: Performed at [place] [address], [place], [place] [address]   Ct Abdomen Pelvis W Contrast  Result Date: 12/28/2017 CLINICAL DATA:  84 year old female with known pancreatic cancer. Abdominal pain with nausea and vomiting since yesterday evening. No bowel movement in the past 2 days. EXAM: CT ABDOMEN AND PELVIS WITH CONTRAST TECHNIQUE: Multidetector CT imaging of the abdomen and pelvis was performed using the standard protocol following bolus administration of intravenous contrast. CONTRAST:  110mL ISOVUE-300 IOPAMIDOL  (ISOVUE-300) INJECTION 61% COMPARISON:  CT the abdomen and pelvis 12/01/2017. FINDINGS: Lower chest: New 7 x 5 mm (mean diameter of 6  mm) nodule in the medial aspect of the right lower lobe (axial image 29 of series 4). Areas of bronchiectasis and scarring in the inferior segment of the lingula. Hepatobiliary: No suspicious cystic or solid hepatic lesions. Mild intrahepatic biliary ductal dilatation, similar to the prior study. Common bile duct measures 10 mm in the porta hepatis. Status post cholecystectomy. Pancreas: In the body of the pancreas there is again a large hypovascular mass measuring 3.5 x 5.0 cm (axial image 25 of series 2). This is associated with proximal pancreatic ductal dilatation throughout the distal body and tail of the pancreas. Spleen: Several well-defined low-attenuation lesions in the spleen are stable in size and appearance to the prior study, and although not characterized are favored to be benign. Adrenals/Urinary Tract: Low-attenuation lesions in the left kidney compatible with simple cysts, largest of which measures 1.7 cm in the lower pole of the left kidney. Other subcentimeter low-attenuation lesions are noted in the left kidney, too small to characterize, but favored to represent tiny cysts. Right kidney and bilateral adrenal glands are normal in appearance. No hydroureteronephrosis. Urinary bladder is normal in appearance. Stomach/Bowel: Normal appearance of the stomach. No pathologic dilatation of small bowel. Mild dilatation of the colon, most evident in the region of the cecum measuring 7.4 cm in diameter. Multiple air-fluid levels noted throughout the colon. There is extensive asymmetric mural thickening and enhancement in the distal sigmoid colon and proximal rectum, best appreciated on axial images 67 and 62 of series 2, highly concerning for primary colorectal neoplasm. Some thickening of the mesorectal fascia is noted on the left side measuring up to 1.6 x 0.7 cm (axial  image 68 of series 2). Vascular/Lymphatic: Aortic atherosclerosis, without evidence of aneurysm or dissection in the abdominal or pelvic vasculature. Previously described pancreatic mass is intimately associated with the superior mesenteric vein, splenic vein and splenoportal confluence, which appear mildly narrowed by the lesion. There is an intact fat plane between the lesion and the superior mesenteric artery. No involvement of the celiac axis, although the splenic artery and common hepatic artery both come in close proximity to the lesion without discrete intervening fat plane. Several borderline enlarged and mildly enlarged left inguinal lymph nodes are noted measuring up to 12 mm in short axis (axial image 68 of series 2). Reproductive: Status post hysterectomy. Ovaries are not confidently identified may be surgically absent or atrophic. Other: Trace volume of ascites. No pneumoperitoneum. Multiple enhancing peritoneal nodules are again noted, largest of which are in the anterior aspect of the mid abdomen measuring up to 1.0 x 1.6 cm and 1.6 x 1.3 cm on axial images 50 and 49 of series 2 respectively. Some of these soft tissue nodules extend into the omental fat within a small umbilical hernia. Musculoskeletal: There are no aggressive appearing lytic or blastic lesions noted in the visualized portions of the skeleton. IMPRESSION: 1. Mild dilatation of the colon with multiple air-fluid levels suggestive of very mild distal obstruction related to enlarging colorectal neoplasm involving distal sigmoid colon and proximal rectum, as discussed above. Some adjacent thickening of the mesorectal fascia on the left side concerning for local metastatic disease. 2. Previously noted pancreatic mass is slightly larger than the prior examination, currently measuring 5.0 x 3.5 cm. Peritoneal metastatic disease appears slightly progressive compared to the prior study with slight enlargement of peritoneal implants. There is  also some new left inguinal lymphadenopathy which also appears slightly progressive compared to the prior study. 3. New 7 x 5 mm  nodule in the medial aspect of the right lower lobe. This is nonspecific and may be infectious or inflammatory, however, the possibility of metastatic disease to the right lower lobe should be considered, and close attention on follow-up studies is recommended. 4. Additional incidental findings, as above. Electronically Signed   By: Vinnie Langton M.D.   On: 12/28/2017 18:57   None  Pending Labs Unresulted Labs (From admission, onward)   None      Vitals/Pain Today's Vitals   12/28/17 1845 12/28/17 1900 12/28/17 1915 12/28/17 1937  BP: (!) 169/82 (!) 185/86 (!) 187/86   Pulse: 79 79 79   Resp: 13 (!) 23 (!) 22   Temp:      TempSrc:      SpO2: 95% 94% 96%   PainSc:    9     Isolation Precautions No active isolations  Medications Medications  sodium chloride 0.9 % bolus 1,000 mL (0 mLs Intravenous Stopped 12/28/17 1646)  ondansetron (ZOFRAN) injection 4 mg (4 mg Intravenous Given 12/28/17 1543)  fentaNYL (SUBLIMAZE) injection 50 mcg (50 mcg Intravenous Given 12/28/17 1544)  potassium chloride 10 mEq in 100 mL IVPB (0 mEq Intravenous Stopped 12/28/17 1937)  sodium chloride 0.9 % bolus 1,000 mL (0 mLs Intravenous Stopped 12/28/17 1937)  iopamidol (ISOVUE-300) 61 % injection (100 mLs  Contrast Given 12/28/17 1705)  fentaNYL (SUBLIMAZE) injection 50 mcg (50 mcg Intravenous Given 12/28/17 1833)    Mobility walks

## 2017-12-28 NOTE — ED Notes (Signed)
Patient transported to CT 

## 2017-12-29 ENCOUNTER — Other Ambulatory Visit: Payer: Self-pay

## 2017-12-29 ENCOUNTER — Inpatient Hospital Stay (HOSPITAL_COMMUNITY): Payer: PPO

## 2017-12-29 DIAGNOSIS — K56609 Unspecified intestinal obstruction, unspecified as to partial versus complete obstruction: Secondary | ICD-10-CM

## 2017-12-29 DIAGNOSIS — E876 Hypokalemia: Secondary | ICD-10-CM

## 2017-12-29 DIAGNOSIS — C801 Malignant (primary) neoplasm, unspecified: Secondary | ICD-10-CM

## 2017-12-29 LAB — GLUCOSE, CAPILLARY
GLUCOSE-CAPILLARY: 116 mg/dL — AB (ref 70–99)
GLUCOSE-CAPILLARY: 136 mg/dL — AB (ref 70–99)
GLUCOSE-CAPILLARY: 148 mg/dL — AB (ref 70–99)
Glucose-Capillary: 111 mg/dL — ABNORMAL HIGH (ref 70–99)
Glucose-Capillary: 118 mg/dL — ABNORMAL HIGH (ref 70–99)
Glucose-Capillary: 133 mg/dL — ABNORMAL HIGH (ref 70–99)
Glucose-Capillary: 188 mg/dL — ABNORMAL HIGH (ref 70–99)

## 2017-12-29 LAB — BASIC METABOLIC PANEL
Anion gap: 11 (ref 5–15)
BUN: 8 mg/dL (ref 8–23)
CO2: 22 mmol/L (ref 22–32)
Calcium: 8.5 mg/dL — ABNORMAL LOW (ref 8.9–10.3)
Chloride: 106 mmol/L (ref 98–111)
Creatinine, Ser: 0.54 mg/dL (ref 0.44–1.00)
GFR calc Af Amer: >60 mL/min (ref >60)
GFR calc non Af Amer: >60 mL/min (ref >60)
Glucose, Bld: 137 mg/dL — ABNORMAL HIGH (ref 70–99)
Potassium: 2.6 mmol/L — CL (ref 3.5–5.1)
Sodium: 139 mmol/L (ref 135–145)

## 2017-12-29 LAB — CBC
HCT: 42.6 % (ref 36.0–46.0)
HEMOGLOBIN: 13.3 g/dL (ref 12.0–15.0)
MCH: 31 pg (ref 26.0–34.0)
MCHC: 31.2 g/dL (ref 30.0–36.0)
MCV: 99.3 fL (ref 80.0–100.0)
Platelets: 251 10*3/uL (ref 150–400)
RBC: 4.29 MIL/uL (ref 3.87–5.11)
RDW: 13.2 % (ref 11.5–15.5)
WBC: 9.6 10*3/uL (ref 4.0–10.5)
nRBC: 0 % (ref 0.0–0.2)

## 2017-12-29 LAB — HEPATIC FUNCTION PANEL
ALK PHOS: 62 U/L (ref 38–126)
ALT: 13 U/L (ref 0–44)
AST: 22 U/L (ref 15–41)
Albumin: 3 g/dL — ABNORMAL LOW (ref 3.5–5.0)
Bilirubin, Direct: 0.2 mg/dL (ref 0.0–0.2)
Indirect Bilirubin: 0.3 mg/dL (ref 0.3–0.9)
Total Bilirubin: 0.5 mg/dL (ref 0.3–1.2)
Total Protein: 6 g/dL — ABNORMAL LOW (ref 6.5–8.1)

## 2017-12-29 LAB — MAGNESIUM: MAGNESIUM: 1.9 mg/dL (ref 1.7–2.4)

## 2017-12-29 MED ORDER — POTASSIUM CHLORIDE 10 MEQ/100ML IV SOLN
10.0000 meq | INTRAVENOUS | Status: AC
  Administered 2017-12-29 (×4): 10 meq via INTRAVENOUS
  Filled 2017-12-29 (×3): qty 100

## 2017-12-29 MED ORDER — POTASSIUM CHLORIDE 10 MEQ/100ML IV SOLN
INTRAVENOUS | Status: AC
  Filled 2017-12-29: qty 100

## 2017-12-29 MED ORDER — POTASSIUM CHLORIDE 10 MEQ/100ML IV SOLN
10.0000 meq | INTRAVENOUS | Status: AC
  Administered 2017-12-29 (×4): 10 meq via INTRAVENOUS
  Filled 2017-12-29 (×4): qty 100

## 2017-12-29 MED ORDER — PROMETHAZINE HCL 25 MG/ML IJ SOLN
12.5000 mg | Freq: Once | INTRAMUSCULAR | Status: AC
  Administered 2017-12-29: 12.5 mg via INTRAVENOUS
  Filled 2017-12-29: qty 1

## 2017-12-29 MED ORDER — SODIUM CHLORIDE 0.9% FLUSH: 9.0000 mL | INTRAVENOUS | Status: DC | PRN

## 2017-12-29 MED ORDER — ONDANSETRON HCL 4 MG/2ML IJ SOLN
4.0000 mg | Freq: Four times a day (QID) | INTRAMUSCULAR | Status: DC | PRN
  Administered 2017-12-29 – 2017-12-30 (×2): 4 mg via INTRAVENOUS

## 2017-12-29 MED ORDER — DIPHENHYDRAMINE HCL 50 MG/ML IJ SOLN: 12.5000 mg | Freq: Four times a day (QID) | INTRAMUSCULAR | Status: DC | PRN

## 2017-12-29 MED ORDER — METOPROLOL TARTRATE 5 MG/5ML IV SOLN: 5.0000 mg | Freq: Three times a day (TID) | INTRAVENOUS | Status: DC | PRN

## 2017-12-29 MED ORDER — ORAL CARE MOUTH RINSE
15.0000 mL | Freq: Two times a day (BID) | OROMUCOSAL | Status: DC
  Administered 2017-12-29 (×2): 15 mL via OROMUCOSAL

## 2017-12-29 MED ORDER — FENTANYL 40 MCG/ML IV SOLN
INTRAVENOUS | Status: DC
  Administered 2017-12-29: 130 ug via INTRAVENOUS
  Administered 2017-12-29: 60 ug via INTRAVENOUS
  Administered 2017-12-29 – 2017-12-30 (×2): 40 ug via INTRAVENOUS
  Administered 2017-12-30: 140 ug via INTRAVENOUS
  Filled 2017-12-29: qty 1000

## 2017-12-29 MED ORDER — DIPHENHYDRAMINE HCL 12.5 MG/5ML PO ELIX: 12.5000 mg | ORAL_SOLUTION | Freq: Four times a day (QID) | ORAL | Status: DC | PRN

## 2017-12-29 MED ORDER — NALOXONE HCL 0.4 MG/ML IJ SOLN: 0.4000 mg | INTRAMUSCULAR | Status: DC | PRN

## 2017-12-29 MED ORDER — MINERAL OIL RE ENEM
1.0000 | ENEMA | Freq: Once | RECTAL | Status: DC
  Filled 2017-12-29: qty 1

## 2017-12-29 NOTE — Consult Note (Signed)
CROSS COVER [facility] Reason for Consult: Severe abdominal pain.  Referring Physician: THP  Molly [name] is an 84 y.o. female.  HPI: Mr. Molly [name] is a 84 year old white female with multiple medical problems listed below admitted to the hospital with worsening left lower quadrant pain. She been followed by Dr. Truitt Merle from the from the [place] for metastatic stage IV pancreatic cancer (cT3, cN0, pM11). She had a CT scan of the abdomen and pelvis done on 11/13/2017 that revealed a 4 x 3.2 x 3.3 cm pancreatic body mass causing obstruction and significant significant dilation of the pancreatic duct in the region of the pancreatic tail this seemed to involve the splenic vein and the superior aspect of the SMV and the confluence of the portal vein this also seemed to partially surround the common hepatic artery.additionally on the scan there was some abnormality noted in the sigmoid sigmoid colon there is a question of a possible mass in this area along with diverticulosis without diverticulitis. Also noted was a 303 nodular densities within the anterior abdominal wall hernia measuring 1.6 cm there were thought to be new compared to the previous scan suspicious for. Intra-abdominal metastases. There are multiple low-density lesions in the spleen fatty liver with intrahepatic biliary ductal dilatation. She had a follow-up PET scan on 11/25/2019that revealed a 5 cm hypermetabolic mass in the pancreatic body and tail suspicious for pancreatic adenocarcinoma along with a hypermetabolic mass in the sigmoid colon with adjacent paracolonic adenopathy. Mildly hypermetabolic bilateral hilar lymph nodes also noted. A colonoscopy was attempted by Dr. Oretha Caprice on 12/13/2017 but the scope could not be passed through the mass noted in the sigmoid colon. The colonoscope was changed to gastric gastroscope but even that could not be advanced vicinity. The procedure was aborted at this point. Patient was admitted  to the hospital worsening abdominal pain or repeat CT scan was done yesterday. g dilation of the small bowel was noted. Mild dilation of the colon was noted with him the cecum measuring 7.4 cm multiple air-fluid levels noted throughout the colon with extensive asymmetric mural thickening and enhancement of the distal sigmoid colon. Patient's abdominal pain seemed to have worsened this afternoon and therefore a stat GI consult was requested by the [facility] hospitalist Dr. Erlinda Hong. Patient is complaining of severe pain in the left lower quadrant was were done that revealed large fluid-filled loops of large bowel with multiple air-fluid levels. On reviewing the colonoscopy report. Is my impression that the sigmoid stricture was from extrinsic compression from intra-abdominal metastatic diseaseand not from her primary colonic malignancy.  Past Medical History:  Diagnosis Date  . Cataract    removed both eyes  . Diabetes mellitus without complication ([place]) 0/09/6281   diet controlled   . Diverticulosis of colon   . Family history of cervical cancer   . Family history of colon cancer   . GERD (gastroesophageal reflux disease)   . Glaucoma   . History of uterine cancer   . Hyperlipidemia   . Hypertension   . Kidney stones   . MALIGNANT NEOPLASM OF UTERUS PART UNSPECIFIED 1998  . Pancreatic cancer ([name])   . Thyroid disease    hypothyroidism    Past Surgical History:  Procedure Laterality Date  . ABDOMINAL HYSTERECTOMY  12/22/1996   Uterine Cancer Dr Phineas Real  . BREAST BIOPSY  1996   Benign  . BREAST EXCISIONAL BIOPSY Left   . CHOLECYSTECTOMY    . COLONOSCOPY    . COLONOSCOPY  WITH PROPOFOL N/A 12/13/2017   Procedure: COLONOSCOPY WITH PROPOFOL;  Surgeon: Milus Banister, MD;  Location: [facility];  Service: Endoscopy;  Laterality: N/A;  . Detached Retina Repair OS    . FACIAL LACERATION REPAIR Left 01/20/2015   Procedure: FACIAL LACERATION REPAIR;  Surgeon: Danice Goltz, MD;  Location:  [facility];  Service: Ophthalmology;  Laterality: Left;  . Release R Long Finger A1 Pulley  01/23/2008   Dr Daylene Katayama  . RUPTURED GLOBE EXPLORATION AND REPAIR Left 01/20/2015   Procedure: REPAIR OF RUPTURED GLOBE and FACIAL LACERATIONS;  Surgeon: Danice Goltz, MD;  Location: [facility];  Service: Ophthalmology;  Laterality: Left;  . UPPER GASTROINTESTINAL ENDOSCOPY      Family History  Problem Relation Age of Onset  . Heart disease Father 35       MI  . Cancer Sister 74       Cervical Cancer  . Cancer Mother 70       gastric cancer- pt states stomach or  liver cancer - unsure which   . Colon cancer Maternal Aunt        dx 1s  . Cancer Maternal Grandmother        possible stomach cancer  . Diabetes Neg Hx   . Breast cancer Neg Hx   . Colon polyps Neg Hx   . Esophageal cancer Neg Hx   . Rectal cancer Neg Hx     Social History:  reports that she has never smoked. She has never used smokeless tobacco. She reports that she does not drink alcohol or use drugs.  Allergies:  Allergies  Allergen Reactions  . Erythromycin     Intolerant  . Ezetimibe-Simvastatin     REACTION: MUSCLE ACHES AND PAINS.   Marland Kitchen Latex Swelling    "thrush" type rash and swelling  . Macrodantin [Nitrofurantoin Macrocrystal] Nausea Only  . Metformin And Related Other (See Comments)    Abdominal pain with short and long acting metformin.    Marland Kitchen Morphine     Chest pain  . Other Other (See Comments)    novacaine-sick  . Petroleum Jelly [Skin Protectants, Misc.] Other (See Comments)    Skin irritation.   Marland Kitchen Preparation H [Phenyleph-Shark Liv Oil-Mo-Pet] Other (See Comments)    Local irritation, burning.   . Procaine Hcl     Can tolerate lidocaine  . Simvastatin     myalgias  . Sulfamethoxazole Other (See Comments)    Does not agree with her  . White Petrolatum Other (See Comments)    Skin irritation.   . Adhesive [Tape] Rash  . Tape Rash    Rash Rash   Medications: I have reviewed the patient's current  medications.  Results for orders placed or performed during the hospital encounter of 12/28/17 (from the past 48 hour(s))  Lipase, blood     Status: None   Collection Time: 12/28/17  3:46 PM  Result Value Ref Range   Lipase 24 11 - 51 U/L    Comment: Performed at [facility], [place] [address] [place], [place] [address]  Comprehensive metabolic panel     Status: Abnormal   Collection Time: 12/28/17  3:46 PM  Result Value Ref Range   Sodium 141 135 - 145 mmol/L   Potassium 3.0 (L) 3.5 - 5.1 mmol/L   Chloride 103 98 - 111 mmol/L   CO2 26 22 - 32 mmol/L   Glucose, Bld 150 (H) 70 - 99 mg/dL   BUN 11  8 - 23 mg/dL   Creatinine, Ser 0.61 0.44 - 1.00 mg/dL   Calcium 9.1 8.9 - 10.3 mg/dL   Total Protein 6.9 6.5 - 8.1 g/dL   Albumin 3.5 3.5 - 5.0 g/dL   AST 20 15 - 41 U/L   ALT 16 0 - 44 U/L   Alkaline Phosphatase 70 38 - 126 U/L   Total Bilirubin 0.6 0.3 - 1.2 mg/dL   GFR calc non Af Amer >60 >60 mL/min   GFR calc Af Amer >60 >60 mL/min   Anion gap 12 5 - 15    Comment: Performed at [facility], [place] [address], [place], [name] [address]  CBC     Status: Abnormal   Collection Time: 12/28/17  3:46 PM  Result Value Ref Range   WBC 8.7 4.0 - 10.5 K/uL   RBC 4.78 3.87 - 5.11 MIL/uL   Hemoglobin 14.8 12.0 - 15.0 g/dL   HCT 46.8 (H) 36.0 - 46.0 %   MCV 97.9 80.0 - 100.0 fL   MCH 31.0 26.0 - 34.0 pg   MCHC 31.6 30.0 - 36.0 g/dL   RDW 13.2 11.5 - 15.5 %   Platelets 263 150 - 400 K/uL   nRBC 0.0 0.0 - 0.2 %    Comment: Performed at [facility], [place] [address], [place], [place] [address]  Urinalysis, Routine w reflex microscopic     Status: Abnormal   Collection Time: 12/28/17  4:38 PM  Result Value Ref Range   Color, Urine STRAW (A) YELLOW   APPearance CLEAR CLEAR   Specific Gravity, Urine 1.008 1.005 - 1.030   pH 7.0 5.0 - 8.0   Glucose, UA NEGATIVE NEGATIVE mg/dL   Hgb urine dipstick NEGATIVE NEGATIVE   Bilirubin Urine  NEGATIVE NEGATIVE   Ketones, ur 20 (A) NEGATIVE mg/dL   Protein, ur NEGATIVE NEGATIVE mg/dL   Nitrite NEGATIVE NEGATIVE   Leukocytes, UA NEGATIVE NEGATIVE    Comment: Performed at [facility] [address] [place], [place] [address]  Glucose, capillary     Status: Abnormal   Collection Time: 12/29/17 12:44 AM  Result Value Ref Range   Glucose-Capillary 188 (H) 70 - 99 mg/dL  Glucose, capillary     Status: Abnormal   Collection Time: 12/29/17  3:27 AM  Result Value Ref Range   Glucose-Capillary 118 (H) 70 - 99 mg/dL  Basic metabolic panel     Status: Abnormal   Collection Time: 12/29/17  4:42 AM  Result Value Ref Range   Sodium 139 135 - 145 mmol/L   Potassium 2.6 (LL) 3.5 - 5.1 mmol/L    Comment: CRITICAL RESULT CALLED TO, READ BACK BY AND VERIFIED WITH: A LEWIS,RN 12/29/17 0557 RHOLMES    Chloride 106 98 - 111 mmol/L   CO2 22 22 - 32 mmol/L   Glucose, Bld 137 (H) 70 - 99 mg/dL   BUN 8 8 - 23 mg/dL   Creatinine, Ser 0.54 0.44 - 1.00 mg/dL   Calcium 8.5 (L) 8.9 - 10.3 mg/dL   GFR calc non Af Amer >60 >60 mL/min   GFR calc Af Amer >60 >60 mL/min   Anion gap 11 5 - 15    Comment: Performed at [facility], [place] [address], [place], [place] [address]  CBC     Status: None   Collection Time: 12/29/17  4:42 AM  Result Value Ref Range   WBC 9.6 4.0 - 10.5 K/uL  RBC 4.29 3.87 - 5.11 MIL/uL   Hemoglobin 13.3 12.0 - 15.0 g/dL   HCT 42.6 36.0 - 46.0 %   MCV 99.3 80.0 - 100.0 fL   MCH 31.0 26.0 - 34.0 pg   MCHC 31.2 30.0 - 36.0 g/dL   RDW 13.2 11.5 - 15.5 %   Platelets 251 150 - 400 K/uL   nRBC 0.0 0.0 - 0.2 %    Comment: Performed at [facility], [place] [address] [place], [place] [address]  Hepatic function panel     Status: Abnormal   Collection Time: 12/29/17  4:42 AM  Result Value Ref Range   Total Protein 6.0 (L) 6.5 - 8.1 g/dL   Albumin 3.0 (L) 3.5 - 5.0 g/dL   AST 22 15 - 41 U/L   ALT 13 0 - 44 U/L   Alkaline  Phosphatase 62 38 - 126 U/L   Total Bilirubin 0.5 0.3 - 1.2 mg/dL   Bilirubin, Direct 0.2 0.0 - 0.2 mg/dL   Indirect Bilirubin 0.3 0.3 - 0.9 mg/dL    Comment: Performed at [facility], [place] [address] [place], [place] [address]  Magnesium     Status: None   Collection Time: 12/29/17  4:42 AM  Result Value Ref Range   Magnesium 1.9 1.7 - 2.4 mg/dL    Comment: Performed at [facility], [place] [address], [place], [place] [address]  Glucose, capillary     Status: Abnormal   Collection Time: 12/29/17  7:50 AM  Result Value Ref Range   Glucose-Capillary 133 (H) 70 - 99 mg/dL   Comment 1 Notify RN    Comment 2 Document in Chart   Glucose, capillary     Status: Abnormal   Collection Time: 12/29/17 11:54 AM  Result Value Ref Range   Glucose-Capillary 111 (H) 70 - 99 mg/dL    Ct Abdomen Pelvis W Contrast  Result Date: 12/28/2017 CLINICAL DATA:  84 year old female with known pancreatic cancer. Abdominal pain with nausea and vomiting since yesterday evening. No bowel movement in the past 2 days. EXAM: CT ABDOMEN AND PELVIS WITH CONTRAST TECHNIQUE: Multidetector CT imaging of the abdomen and pelvis was performed using the standard protocol following bolus administration of intravenous contrast. CONTRAST:  175mL ISOVUE-300 IOPAMIDOL (ISOVUE-300) INJECTION 61% COMPARISON:  CT the abdomen and pelvis 12/01/2017. FINDINGS: Lower chest: New 7 x 5 mm (mean diameter of 6 mm) nodule in the medial aspect of the right lower lobe (axial image 29 of series 4). Areas of bronchiectasis and scarring in the inferior segment of the lingula. Hepatobiliary: No suspicious cystic or solid hepatic lesions. Mild intrahepatic biliary ductal dilatation, similar to the prior study. Common bile duct measures 10 mm in the porta hepatis. Status post cholecystectomy. Pancreas: In the body of the pancreas there is again a large hypovascular mass measuring 3.5 x 5.0 cm (axial image 25 of series 2).  This is associated with proximal pancreatic ductal dilatation throughout the distal body and tail of the pancreas. Spleen: Several well-defined low-attenuation lesions in the spleen are stable in size and appearance to the prior study, and although not characterized are favored to be benign. Adrenals/Urinary Tract: Low-attenuation lesions in the left kidney compatible with simple cysts, largest of which measures 1.7 cm in the lower pole of the left kidney. Other subcentimeter low-attenuation lesions are noted in the left kidney, too small to characterize, but favored to represent tiny cysts. Right kidney and bilateral adrenal glands are normal in  appearance. No hydroureteronephrosis. Urinary bladder is normal in appearance. Stomach/Bowel: Normal appearance of the stomach. No pathologic dilatation of small bowel. Mild dilatation of the colon, most evident in the region of the cecum measuring 7.4 cm in diameter. Multiple air-fluid levels noted throughout the colon. There is extensive asymmetric mural thickening and enhancement in the distal sigmoid colon and proximal rectum, best appreciated on axial images 67 and 62 of series 2, highly concerning for primary colorectal neoplasm. Some thickening of the mesorectal fascia is noted on the left side measuring up to 1.6 x 0.7 cm (axial image 68 of series 2). Vascular/Lymphatic: Aortic atherosclerosis, without evidence of aneurysm or dissection in the abdominal or pelvic vasculature. Previously described pancreatic mass is intimately associated with the superior mesenteric vein, splenic vein and splenoportal confluence, which appear mildly narrowed by the lesion. There is an intact fat plane between the lesion and the superior mesenteric artery. No involvement of the celiac axis, although the splenic artery and common hepatic artery both come in close proximity to the lesion without discrete intervening fat plane. Several borderline enlarged and mildly enlarged left  inguinal lymph nodes are noted measuring up to 12 mm in short axis (axial image 68 of series 2). Reproductive: Status post hysterectomy. Ovaries are not confidently identified may be surgically absent or atrophic. Other: Trace volume of ascites. No pneumoperitoneum. Multiple enhancing peritoneal nodules are again noted, largest of which are in the anterior aspect of the mid abdomen measuring up to 1.0 x 1.6 cm and 1.6 x 1.3 cm on axial images 50 and 49 of series 2 respectively. Some of these soft tissue nodules extend into the omental fat within a small umbilical hernia. Musculoskeletal: There are no aggressive appearing lytic or blastic lesions noted in the visualized portions of the skeleton. IMPRESSION: 1. Mild dilatation of the colon with multiple air-fluid levels suggestive of very mild distal obstruction related to enlarging colorectal neoplasm involving distal sigmoid colon and proximal rectum, as discussed above. Some adjacent thickening of the mesorectal fascia on the left side concerning for local metastatic disease. 2. Previously noted pancreatic mass is slightly larger than the prior examination, currently measuring 5.0 x 3.5 cm. Peritoneal metastatic disease appears slightly progressive compared to the prior study with slight enlargement of peritoneal implants. There is also some new left inguinal lymphadenopathy which also appears slightly progressive compared to the prior study. 3. New 7 x 5 mm nodule in the medial aspect of the right lower lobe. This is nonspecific and may be infectious or inflammatory, however, the possibility of metastatic disease to the right lower lobe should be considered, and close attention on follow-up studies is recommended. 4. Additional incidental findings, as above. Electronically Signed   By: Vinnie Langton M.D.   On: 12/28/2017 18:57   Review of Systems  Constitutional: Positive for malaise/fatigue and weight loss. Negative for fever.  Eyes: Negative.    Respiratory: Negative.   Cardiovascular: Negative.   Gastrointestinal: Positive for abdominal pain and constipation. Negative for blood in stool, heartburn, melena and vomiting.  Genitourinary: Negative.   Musculoskeletal: Positive for back pain.  Skin: Negative.   Neurological: Negative.   Endo/Heme/Allergies: Negative.   Psychiatric/Behavioral: The patient is nervous/anxious.    Blood pressure (!) 174/83, pulse 80, temperature 99.5 F (37.5 C), temperature source Oral, resp. rate 16, height 5\' 2"  (1.575 m), weight 48.7 kg, SpO2 96 %. Physical Exam  Constitutional: She is oriented to person, place, and time. Vital signs are normal. She appears lethargic.  She appears cachectic. She is cooperative. She appears toxic. She appears distressed.  HENT:  Head: Normocephalic and atraumatic.  Eyes: Lids are normal.  Neck: No thyroid mass and no thyromegaly present.  Cardiovascular: Normal rate and regular rhythm.  Respiratory: Tachypnea noted.  GI: She exhibits distension. She exhibits no fluid wave and no ascites. There is no hepatosplenomegaly. There is abdominal tenderness in the left lower quadrant. There is guarding. There is no rigidity and no CVA tenderness.  Genitourinary: There is tenderness on the right labia.  Neurological: She is oriented to person, place, and time. She appears lethargic.   Assessment/Plan: Metastatic pancreatic cancer with the sigmoid colon stricture-worsening abdominal pain today abdominal films indicated further dilation of the colon with multiple air-fluid levels.. Had a detailed discussion with the patient her husband and her children at the bedside. Patient is very clear about her care and is interested in comfort care only. She has requested to be a DO NOT RESUSCITATE. I have discussed this with Dr. Erlinda Hong who will complete the way paperwork as needed. The patient's husband and children agree that this is something the the patient has expressed a desire for, if she  were to develop a terminal illness. The husband is question whether up colonic stent will be done next week or not. I will discuss this with Dr. Rogers Blocker and further recommendations be made as needed.   Donyea Beverlin 12/29/2017, 1:38 PM

## 2017-12-29 NOTE — Progress Notes (Signed)
PROGRESS NOTE  Molly [name] JGO:[id number] DOB: 04-14-1929 DOA: 12/28/2017 PCP: Tonia Ghent, MD  HPI/Recap of past 24 hours:  C/o epigastric pain, diffuse ab pain, feeling nauseous, intermittent vomiting No fever  Family at bedside  Assessment/Plan: Principal Problem:   Partial bowel obstruction ([place]) Active Problems:   Hypothyroidism   HTN (hypertension)   Hypokalemia   Type 2 diabetes mellitus ([place])   Pancreatic cancer ([place])   Bowel obstruction ([place])  Large bowel obstruction with distal sigmoid colonic mass , pancreatic mass -Due to increased pain, repeat kub obtained, no perforation, colon  Dilation upto 7.9cm -with significant pain, patient and family is interested in comfort focused care, start fentanyl drip, ( report morphin allergy with lip edema), continue ivf, npo, palliative care consult for further goals of care discussion, symptom control -Gi consulted, will discuss possible stenting for comfort  Hypokalemia: replace k  HTN: Prn iv lopressor  Hypothyroidism, on IV Synthroid replacement for now  Code Status:  DNR  Family Communication: patient and family  Disposition Plan: not ready to discharge, significant symptom burden    Consultants:  GI  Palliative care  Procedures:  none  Antibiotics:  none   Objective: BP (!) 174/83 (BP Location: Right Arm)   Pulse 80   Temp 99.5 F (37.5 C) (Oral)   Resp 16   Ht 5\' 2"  (1.575 m)   Wt 48.7 kg   SpO2 96%   BMI 19.64 kg/m   Intake/Output Summary (Last 24 hours) at 12/29/2017 1500 Last data filed at 12/29/2017 5597 Gross per 24 hour  Intake 842.94 ml  Output -  Net 842.94 ml   Filed Weights   12/28/17 2055 12/29/17 0329  Weight: 48.7 kg 48.7 kg    Exam: Patient is examined daily including today on 12/29/2017, exams remain the same as of yesterday except that has changed    General:  Thin, pale, in pain  Cardiovascular: RRR  Respiratory: CTABL  Abdomen: distended,  firm, decreased bowel sounds, tender   Musculoskeletal: No Edema  Neuro: alert, oriented   Data Reviewed: Basic Metabolic Panel: Recent Labs  Lab 12/28/17 1546 12/29/17 0442  NA 141 139  K 3.0* 2.6*  CL 103 106  CO2 26 22  GLUCOSE 150* 137*  BUN 11 8  CREATININE 0.61 0.54  CALCIUM 9.1 8.5*  MG  --  1.9   Liver Function Tests: Recent Labs  Lab 12/28/17 1546 12/29/17 0442  AST 20 22  ALT 16 13  ALKPHOS 70 62  BILITOT 0.6 0.5  PROT 6.9 6.0*  ALBUMIN 3.5 3.0*   Recent Labs  Lab 12/28/17 1546  LIPASE 24   No results for input(s): AMMONIA in the last 168 hours. CBC: Recent Labs  Lab 12/28/17 1546 12/29/17 0442  WBC 8.7 9.6  HGB 14.8 13.3  HCT 46.8* 42.6  MCV 97.9 99.3  PLT 263 251   Cardiac Enzymes:   No results for input(s): CKTOTAL, CKMB, CKMBINDEX, TROPONINI in the last 168 hours. BNP (last 3 results) No results for input(s): BNP in the last 8760 hours.  ProBNP (last 3 results) No results for input(s): PROBNP in the last 8760 hours.  CBG: Recent Labs  Lab 12/29/17 0044 12/29/17 0327 12/29/17 0750 12/29/17 1154  GLUCAP 188* 118* 133* 111*    No results found for this or any previous visit (from the past 240 hour(s)).   Studies: Ct Abdomen Pelvis W Contrast  Result Date: 12/28/2017 CLINICAL DATA:  84 year old female with known  pancreatic cancer. Abdominal pain with nausea and vomiting since yesterday evening. No bowel movement in the past 2 days. EXAM: CT ABDOMEN AND PELVIS WITH CONTRAST TECHNIQUE: Multidetector CT imaging of the abdomen and pelvis was performed using the standard protocol following bolus administration of intravenous contrast. CONTRAST:  158mL ISOVUE-300 IOPAMIDOL (ISOVUE-300) INJECTION 61% COMPARISON:  CT the abdomen and pelvis 12/01/2017. FINDINGS: Lower chest: New 7 x 5 mm (mean diameter of 6 mm) nodule in the medial aspect of the right lower lobe (axial image 29 of series 4). Areas of bronchiectasis and scarring in the  inferior segment of the lingula. Hepatobiliary: No suspicious cystic or solid hepatic lesions. Mild intrahepatic biliary ductal dilatation, similar to the prior study. Common bile duct measures 10 mm in the porta hepatis. Status post cholecystectomy. Pancreas: In the body of the pancreas there is again a large hypovascular mass measuring 3.5 x 5.0 cm (axial image 25 of series 2). This is associated with proximal pancreatic ductal dilatation throughout the distal body and tail of the pancreas. Spleen: Several well-defined low-attenuation lesions in the spleen are stable in size and appearance to the prior study, and although not characterized are favored to be benign. Adrenals/Urinary Tract: Low-attenuation lesions in the left kidney compatible with simple cysts, largest of which measures 1.7 cm in the lower pole of the left kidney. Other subcentimeter low-attenuation lesions are noted in the left kidney, too small to characterize, but favored to represent tiny cysts. Right kidney and bilateral adrenal glands are normal in appearance. No hydroureteronephrosis. Urinary bladder is normal in appearance. Stomach/Bowel: Normal appearance of the stomach. No pathologic dilatation of small bowel. Mild dilatation of the colon, most evident in the region of the cecum measuring 7.4 cm in diameter. Multiple air-fluid levels noted throughout the colon. There is extensive asymmetric mural thickening and enhancement in the distal sigmoid colon and proximal rectum, best appreciated on axial images 67 and 62 of series 2, highly concerning for primary colorectal neoplasm. Some thickening of the mesorectal fascia is noted on the left side measuring up to 1.6 x 0.7 cm (axial image 68 of series 2). Vascular/Lymphatic: Aortic atherosclerosis, without evidence of aneurysm or dissection in the abdominal or pelvic vasculature. Previously described pancreatic mass is intimately associated with the superior mesenteric vein, splenic vein and  splenoportal confluence, which appear mildly narrowed by the lesion. There is an intact fat plane between the lesion and the superior mesenteric artery. No involvement of the celiac axis, although the splenic artery and common hepatic artery both come in close proximity to the lesion without discrete intervening fat plane. Several borderline enlarged and mildly enlarged left inguinal lymph nodes are noted measuring up to 12 mm in short axis (axial image 68 of series 2). Reproductive: Status post hysterectomy. Ovaries are not confidently identified may be surgically absent or atrophic. Other: Trace volume of ascites. No pneumoperitoneum. Multiple enhancing peritoneal nodules are again noted, largest of which are in the anterior aspect of the mid abdomen measuring up to 1.0 x 1.6 cm and 1.6 x 1.3 cm on axial images 50 and 49 of series 2 respectively. Some of these soft tissue nodules extend into the omental fat within a small umbilical hernia. Musculoskeletal: There are no aggressive appearing lytic or blastic lesions noted in the visualized portions of the skeleton. IMPRESSION: 1. Mild dilatation of the colon with multiple air-fluid levels suggestive of very mild distal obstruction related to enlarging colorectal neoplasm involving distal sigmoid colon and proximal rectum, as discussed above.  Some adjacent thickening of the mesorectal fascia on the left side concerning for local metastatic disease. 2. Previously noted pancreatic mass is slightly larger than the prior examination, currently measuring 5.0 x 3.5 cm. Peritoneal metastatic disease appears slightly progressive compared to the prior study with slight enlargement of peritoneal implants. There is also some new left inguinal lymphadenopathy which also appears slightly progressive compared to the prior study. 3. New 7 x 5 mm nodule in the medial aspect of the right lower lobe. This is nonspecific and may be infectious or inflammatory, however, the possibility  of metastatic disease to the right lower lobe should be considered, and close attention on follow-up studies is recommended. 4. Additional incidental findings, as above. Electronically Signed   By: Vinnie Langton M.D.   On: 12/28/2017 18:57    Scheduled Meds: . fentaNYL   Intravenous Q4H  . insulin aspart  0-9 Units Subcutaneous Q4H  . levothyroxine  44 mcg Intravenous Daily  . mouth rinse  15 mL Mouth Rinse BID    Continuous Infusions: . sodium chloride Stopped (12/29/17 0092)  . potassium chloride       Time spent: 16mins I have personally reviewed and interpreted on  12/29/2017 daily labs, imagings as discussed above under date review session and assessment and plans.  I reviewed all nursing notes, pharmacy notes, consultant notes,  vitals, pertinent old records  I have discussed plan of care as described above with RN , patient and family on 12/29/2017   Florencia Reasons MD, PhD  [facility] Pager [phone]. If 7PM-7AM, please contact night-coverage at [email or web address], password [facility] 12/29/2017, 3:00 PM  LOS: 1 day

## 2017-12-29 NOTE — Progress Notes (Signed)
Pt refused enema. Reports she had an enema done quite recently by 2 nurses and it was an uncomfortable experience with no successful result. Prefers not to have enema at this time. Daughter at bedside and supports pt's request. Pt wants to be left alone for now. Enema, therefore not given.

## 2017-12-30 DIAGNOSIS — Z7189 Other specified counseling: Secondary | ICD-10-CM

## 2017-12-30 DIAGNOSIS — F419 Anxiety disorder, unspecified: Secondary | ICD-10-CM

## 2017-12-30 DIAGNOSIS — Z515 Encounter for palliative care: Secondary | ICD-10-CM

## 2017-12-30 LAB — GLUCOSE, CAPILLARY
Glucose-Capillary: 177 mg/dL — ABNORMAL HIGH (ref 70–99)
Glucose-Capillary: 134 mg/dL — ABNORMAL HIGH (ref 70–99)
Glucose-Capillary: 132 mg/dL — ABNORMAL HIGH (ref 70–99)
Glucose-Capillary: 123 mg/dL — ABNORMAL HIGH (ref 70–99)

## 2017-12-30 MED ORDER — LIDOCAINE 5 % EX PTCH
1.0000 | MEDICATED_PATCH | CUTANEOUS | Status: DC
  Administered 2017-12-30 – 2017-12-31 (×2): 1 via TRANSDERMAL
  Filled 2017-12-30 (×3): qty 1

## 2017-12-30 MED ORDER — LORAZEPAM 2 MG/ML IJ SOLN
0.5000 mg | Freq: Four times a day (QID) | INTRAMUSCULAR | Status: DC | PRN
  Administered 2017-12-31: 0.5 mg via INTRAVENOUS
  Filled 2017-12-30: qty 1

## 2017-12-30 MED ORDER — METOCLOPRAMIDE HCL 5 MG/ML IJ SOLN
10.0000 mg | Freq: Once | INTRAMUSCULAR | Status: AC
  Administered 2017-12-30: 10 mg via INTRAVENOUS
  Filled 2017-12-30: qty 2

## 2017-12-30 MED ORDER — FENTANYL 40 MCG/ML IV SOLN
INTRAVENOUS | Status: DC
  Administered 2017-12-30: 61.49 ug via INTRAVENOUS
  Administered 2017-12-30: 30.34 ug via INTRAVENOUS
  Administered 2017-12-30: 52.82 ug via INTRAVENOUS
  Administered 2017-12-30: 136.8 ug via INTRAVENOUS
  Administered 2017-12-31: 102.1 ug via INTRAVENOUS
  Administered 2017-12-31: 69.86 ug via INTRAVENOUS
  Administered 2017-12-31: 78.39 ug via INTRAVENOUS
  Administered 2017-12-31: 78.59 ug via INTRAVENOUS
  Administered 2017-12-31: 59.96 ug via INTRAVENOUS
  Administered 2017-12-31: 12:00:00 via INTRAVENOUS
  Administered 2018-01-01: 92.26 ug via INTRAVENOUS
  Administered 2018-01-01: 79.35 ug via INTRAVENOUS
  Administered 2018-01-01: 108.2 ug via INTRAVENOUS
  Filled 2017-12-30: qty 25

## 2017-12-30 MED ORDER — GLYCOPYRROLATE 0.2 MG/ML IJ SOLN
0.4000 mg | Freq: Four times a day (QID) | INTRAMUSCULAR | Status: AC
  Administered 2017-12-30 – 2018-01-01 (×8): 0.4 mg via INTRAVENOUS
  Filled 2017-12-30 (×8): qty 2

## 2017-12-30 NOTE — Progress Notes (Signed)
PROGRESS NOTE  Molly [name] XBD:[id number] DOB: November 03, 1929 DOA: 12/28/2017 PCP: Tonia Ghent, MD  HPI/Recap of past 24 hours:  Patient reports she vomited all night,  She is feeling better this am Daughter at bedside  Assessment/Plan: Principal Problem:   Partial bowel obstruction ([place]) Active Problems:   Hypothyroidism   HTN (hypertension)   Hypokalemia   Type 2 diabetes mellitus ([place])   Pancreatic cancer ([place])   Bowel obstruction ([place])   Colonic obstruction ([place])   Cancer ([place])   Palliative care by specialist   Goals of care, counseling/discussion   Anxiety disorder   Large bowel obstruction with distal sigmoid colonic mass, pancreatic mass -Due to increased pain, repeat kub obtained, no perforation, colon  Dilation upto 7.9cm on kub obtained on 12/28 - start fentanyl drip, ( report morphin allergy with lip edema) palliative care input appreciated.  -Gi input appreciated  Hypokalemia: replaced k, now on comfort care  HTN: Prn iv lopressor  Hypothyroidism, d/c  IV Synthroid , focus on comfort  Code Status:  DNR  Family Communication: patient and family  Disposition Plan: residential hospice tomorrow, significant symptom burden    Consultants:  GI  Palliative care  Procedures:  none  Antibiotics:  none    Objective: BP 135/77 (BP Location: Right Arm)   Pulse 85   Temp 98.5 F (36.9 C) (Oral)   Resp 16   Ht 5\' 2"  (1.575 m)   Wt 48.7 kg   SpO2 95%   BMI 19.64 kg/m   Intake/Output Summary (Last 24 hours) at 12/30/2017 1346 Last data filed at 12/29/2017 1700 Gross per 24 hour  Intake -  Output 350 ml  Net -350 ml   Filed Weights   12/28/17 2055 12/29/17 0329  Weight: 48.7 kg 48.7 kg    Exam: Patient is examined daily including today on 12/30/2017, exams remain the same as of yesterday except that has changed    General:  Thin, pale, in pain  Cardiovascular: RRR  Respiratory: CTABL  Abdomen: distended,  firm, decreased bowel sounds, tender   Musculoskeletal: No Edema  Neuro: alert, oriented     Data Reviewed: Basic Metabolic Panel: Recent Labs  Lab 12/28/17 1546 12/29/17 0442  NA 141 139  K 3.0* 2.6*  CL 103 106  CO2 26 22  GLUCOSE 150* 137*  BUN 11 8  CREATININE 0.61 0.54  CALCIUM 9.1 8.5*  MG  --  1.9   Liver Function Tests: Recent Labs  Lab 12/28/17 1546 12/29/17 0442  AST 20 22  ALT 16 13  ALKPHOS 70 62  BILITOT 0.6 0.5  PROT 6.9 6.0*  ALBUMIN 3.5 3.0*   Recent Labs  Lab 12/28/17 1546  LIPASE 24   No results for input(s): AMMONIA in the last 168 hours. CBC: Recent Labs  Lab 12/28/17 1546 12/29/17 0442  WBC 8.7 9.6  HGB 14.8 13.3  HCT 46.8* 42.6  MCV 97.9 99.3  PLT 263 251   Cardiac Enzymes:   No results for input(s): CKTOTAL, CKMB, CKMBINDEX, TROPONINI in the last 168 hours. BNP (last 3 results) No results for input(s): BNP in the last 8760 hours.  ProBNP (last 3 results) No results for input(s): PROBNP in the last 8760 hours.  CBG: Recent Labs  Lab 12/29/17 1958 12/29/17 2349 12/30/17 0358 12/30/17 0744 12/30/17 1206  GLUCAP 116* 148* 177* 134* 132*    No results found for this or any previous visit (from the past 240 hour(s)).   Studies: Dg  Abd 1 View  Result Date: 12/29/2017 CLINICAL DATA:  Abdominal pain EXAM: ABDOMEN - 1 VIEW COMPARISON:  12/28/2017 FINDINGS: Dilated gas-filled loops of large bowel are identified in the mid abdomen and right lower quadrant up to 7.9 cm. No significant small bowel dilatation is identified. There is no free air. Contrast filled nondistended bladder projects within the pelvis. There is mild dextroconvex curvature of the lower lumbar spine without acute nor aggressive osseous lesions. Cholecystectomy clips are present in the right upper quadrant. IMPRESSION: Nonspecific bowel gas pattern with gas filled dilatation of large bowel to 7.9 cm, in part due to patient's known sigmoid and proximal rectal  mass. More distal fluid-filled large bowel is not entirely excluded but is not apparent radiographically. Electronically Signed   By: Ashley Royalty M.D.   On: 12/29/2017 15:18    Scheduled Meds: . fentaNYL   Intravenous Q4H  . glycopyrrolate  0.4 mg Intravenous Q6H  . lidocaine  1 patch Transdermal Q24H  . mouth rinse  15 mL Mouth Rinse BID  . mineral oil  1 enema Rectal Once    Continuous Infusions:   Time spent: 39mins, case discussed with palliative care Dr Rowe Pavy  I have personally reviewed and interpreted on  12/30/2017 daily labs,  imagings as discussed above under date review session and assessment and plans.  I reviewed all nursing notes, pharmacy notes, consultant notes,  vitals, pertinent old records  I have discussed plan of care as described above with RN , patient and family on 12/30/2017   Florencia Reasons MD, PhD  [facility] Pager [phone]. If 7PM-7AM, please contact night-coverage at [email or web address], password [facility] 12/30/2017, 1:46 PM  LOS: 2 days

## 2017-12-30 NOTE — Consult Note (Signed)
Consultation Note Date: 12/30/2017   Patient Name: Molly [name]  DOB: Jan 13, 1929  MRN: [id number]  Age / Sex: 84 y.o., female  PCP: Tonia Ghent, MD Referring Physician: Florencia Reasons, MD  Reason for Consultation: Establishing goals of care, Non pain symptom management and Pain control  HPI/Patient Profile: 84 y.o. female  admitted on 12/28/2017    Clinical Assessment and Goals of Care:  84 year old lady who lives at home with her husband. She has a son and her daughter. Over the course of the past few months, the patient has had a rapid weight loss. The patient was diagnosed with metastatic stage IV pancreatic cancer recently.  The patient was admitted to the hospital with worsening abdominal pain. CT scan was done. The patient has questionable right lower lobe lung nodule, mild dilation of the colon, multiple air-fluid levels noted throughout the colon, extensive mural thickening and enhancement of the distal sigmoid colon. Patient likely has sigmoid stricture from extrinsic compression from intra-abdominal metastatic disease.   Medical oncology, gastroenterology specialists are following. Patient remains admitted under hospital medicine service.  Palliative care consultation for pain management, goals of care discussions has been requested.  Patient is a frail elderly lady resting in bed. Her husband and son are present at the bedside. I introduced myself and palliative care as follows: Palliative medicine is specialized medical care for people living with serious illness. It focuses on providing relief from the symptoms and stress of a serious illness. The goal is to improve quality of life for both the patient and the family.  The patient did not rest well overnight at all. She had multiple episodes of nausea and vomiting. She is burping and also complains of mild chest discomfort. She is not passing  gas. She complains of left-sided abdominal discomfort. She complains of referred pain to her back area as well.  Goals wishes and values discussed. We discussed about appropriate pain management. Also discussed about non-pain symptom management. Offered supportive care and active listening.Acknowledged the serious and incurable nature of the patient's condition. Briefly discussed about hospice philosophy of care as well. The patient remains invested in finding out whether or not a colonic stent will help her with both symptom control as well as life prolongation. For now, encouraged her to continue to rest and with increased doses of opioids.  Also discussed with patient's son outside the patient's room at his request. Discussed that the patient remains seriously ill with a high likelihood of ongoing decline decompensation and even death. He is well aware. Please note additional discussions below. Thank you for the consult.  NEXT OF KIN  husband of over 49 years Has a son and daughter She is from [place], [place]. Her children live locally.   SUMMARY OF RECOMMENDATIONS    1. DNR DNI 2. Up titrate Fentanyl PCA, add basal rate, increase bolus dose.  3. Add Ativan IV PRN for anxiety 4. Add scheduled Robinul to help with colicky spasms type pain in the abdomen.  5. Add lidoderm patch  and heating pad to back area 6. Ongoing discussions with patient and family regarding appropriate disposition, it appears that residential hospice appears most appropriate. PMT to follow.  Consider Haldol as an anti emetic and/or Octreotide drip, if needed.   Code Status/Advance Care Planning:  DNR    Symptom Management:    as above   Palliative Prophylaxis:   Delirium Protocol   Psycho-social/Spiritual:   Desire for further Chaplaincy support:yes  Additional Recommendations: Education on Hospice  Prognosis:   Unable to determine  Discharge Planning: To Be Determined      Primary  Diagnoses: Present on Admission: . Pancreatic cancer ([place]) . Partial bowel obstruction ([place]) . Bowel obstruction ([place]) . Hypothyroidism . Hypokalemia . HTN (hypertension)   I have reviewed the medical record, interviewed the patient and family, and examined the patient. The following aspects are pertinent.  Past Medical History:  Diagnosis Date  . Cataract    removed both eyes  . Diabetes mellitus without complication ([place]) 09/09/3530   diet controlled   . Diverticulosis of colon   . Family history of cervical cancer   . Family history of colon cancer   . GERD (gastroesophageal reflux disease)   . Glaucoma   . History of uterine cancer   . Hyperlipidemia   . Hypertension   . Kidney stones   . MALIGNANT NEOPLASM OF UTERUS PART UNSPECIFIED 1998  . Pancreatic cancer ([place])   . Thyroid disease    hypothyroidism   Social History   Socioeconomic History  . Marital status: Married    Spouse name: Trilby Drummer  . Number of children: 3  . Years of education: Not on file  . Highest education level: Not on file  Occupational History  . Occupation: Cabin crew    Comment: Retired  . 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 Financial resource strain: Not on file  . Food insecurity:    Worry: Not on file    Inability: Not on file  . Transportation needs:    Medical: Not on file    Non-medical: Not on file  Tobacco Use  . Smoking status: Never Smoker  . Smokeless tobacco: Never Used  Substance and Sexual Activity  . Alcohol use: No    Alcohol/week: 0.0 standard drinks  . Drug use: No  . Sexual activity: Not Currently  Lifestyle  . Physical activity:    Days per week: Not on file    Minutes per session: Not on file  . Stress: Not on file  Relationships  . Social connections:    Talks on phone: Not on file    Gets together: Not on file    Attends religious service: Not on file    Active member of club or organization: Not on file    Attends meetings of  clubs or organizations: Not on file    Relationship status: Not on file  Other Topics Concern  . Not on file  Social History Narrative   Married 1952   Retired Network engineer and bookkeeper   Family History  Problem Relation Age of Onset  . Heart disease Father 53       MI  . Cancer Sister 16       Cervical Cancer  . Cancer Mother 68       gastric cancer- pt states stomach or  liver cancer - unsure which   . Colon cancer Maternal Aunt        dx 50s  .  Cancer Maternal Grandmother        possible stomach cancer  . Diabetes Neg Hx   . Breast cancer Neg Hx   . Colon polyps Neg Hx   . Esophageal cancer Neg Hx   . Rectal cancer Neg Hx    Scheduled Meds: . fentaNYL   Intravenous Q4H  . glycopyrrolate  0.4 mg Intravenous Q6H  . insulin aspart  0-9 Units Subcutaneous Q4H  . levothyroxine  44 mcg Intravenous Daily  . lidocaine  1 patch Transdermal Q24H  . mouth rinse  15 mL Mouth Rinse BID  . mineral oil  1 enema Rectal Once   Continuous Infusions: PRN Meds:.acetaminophen **OR** acetaminophen, diphenhydrAMINE **OR** diphenhydrAMINE, hydrALAZINE, LORazepam, metoprolol tartrate, naloxone **AND** sodium chloride flush, ondansetron **OR** ondansetron (ZOFRAN) IV Medications Prior to Admission:  Prior to Admission medications   Medication Sig Start Date End Date Taking? Authorizing Provider  atenolol (TENORMIN) 25 MG tablet Take 1 tablet (46m) by mouth daily 10/23/17  Yes DTonia Ghent MD  Biotin 10 MG CAPS Take 1 capsule by mouth daily.    Yes [provider]  Black Cohosh-SoyIsoflav-Magnol (ESTROVEN MENOPAUSE RELIEF) CAPS Take 1 capsule by mouth daily.    Yes [provider]  Blood Glucose Monitoring Suppl (OOliver w/Device KIT Use to check blood sugar once daily and as needed.  Non insulin dependent  E11.9 07/16/17  Yes DTonia Ghent MD  Cholecalciferol ([employer] VITAMIN D) 2000 UNITS CAPS Take 1 capsule (2,000 Units total) by mouth daily. 11/23/14   Yes DTonia Ghent MD  Coenzyme Q10 (COQ10 PO) Take 1 tablet by mouth daily.    Yes [provider]  glucose blood (ONETOUCH VERIO) test strip Use as instructed to check blood sugar once daily and as needed.  Non insulin dependent.  E11.9 09/17/17  Yes DTonia Ghent MD  levothyroxine ([place] LEVOTHROID) 88 MCG tablet Take 1 tablet by mouth daily 08/03/17  Yes DTonia Ghent MD  lidocaine (XYLOCAINE) 5 % ointment APPLY A SMALL AMOUNT TO RECTUM 3-4 TIMES PER DAY AS NEEDED Patient taking differently: Apply 1 application topically 3 (three) times daily as needed for mild pain. APPLY A SMALL AMOUNT TO RECTUM 3-4 TIMES PER DAY AS NEEDED 10/10/17  Yes DTonia Ghent MD  Multiple Vitamin (MULTIVITAMIN) tablet Take 1 tablet by mouth daily.     Yes [provider]  Na Sulfate-K Sulfate-Mg Sulf 17.5-3.13-1.6 GM/177ML SOLN Suprep (no substitutions)-TAKE AS DIRECTED. 12/11/17  Yes JMilus Banister MD  [facility] 343XMISC Use to check blood sugar once daily and as needed.  Non insulin dependent.  E11.9 09/17/17  Yes DTonia Ghent MD  polyethylene glycol ([facility]/ GFloria Raveling packet Take 17 g by mouth daily. 12/05/17  Yes JDomenic Polite MD  acetaminophen (TYLENOL) 500 MG tablet Take 1 tablet (500 mg total) by mouth every 6 (six) hours as needed for mild pain or headache. Patient not taking: Reported on 12/28/2017 01/22/15   Dhungel, NFlonnie Overman MD  ALPRAZolam (Duanne Moron 0.25 MG tablet Take 1 tablet (0.25 mg total) by mouth at bedtime as needed for anxiety or sleep. 12/18/17   FTruitt Merle MD   Allergies  Allergen Reactions  . Erythromycin     Intolerant  . Ezetimibe-Simvastatin     REACTION: MUSCLE ACHES AND PAINS.   .Marland KitchenLatex Swelling    "thrush" type rash and swelling  . Macrodantin [Nitrofurantoin Macrocrystal] Nausea Only  . Metformin And Related Other (See Comments)  Abdominal pain with short and long acting metformin.    Marland Kitchen Morphine     Chest pain  . Other  Other (See Comments)    novacaine-sick  . Petroleum Jelly [Skin Protectants, Misc.] Other (See Comments)    Skin irritation.   Marland Kitchen Preparation H [Phenyleph-Shark Liv Oil-Mo-Pet] Other (See Comments)    Local irritation, burning.   . Procaine Hcl     Can tolerate lidocaine  . Simvastatin     myalgias  . Sulfamethoxazole Other (See Comments)    Does not agree with her  . White Petrolatum Other (See Comments)    Skin irritation.   . Adhesive [Tape] Rash  . Tape Rash    Rash Rash   Review of Systems +vomiting +abdominal pain +weakness  Physical Exam Thin weak lady With L sided abdominal pain Decreased breath sounds, afraid to take deep breaths because of abdominal pain Also with some epigastric pain No edema Frail Awake alert S 1 S 2   Vital Signs: BP 135/77 (BP Location: Right Arm)   Pulse 85   Temp 98.5 F (36.9 C) (Oral)   Resp 16   Ht _0  (1.575 m)   Wt 48.7 kg   SpO2 95%   BMI 19.64 kg/m  Pain Scale: Faces POSS *See Group Information*: 1-Acceptable,Awake and alert Pain Score: 4    SpO2: SpO2: 95 % O2 Device:SpO2: 95 % O2 Flow Rate: .   IO: Intake/output summary:   Intake/Output Summary (Last 24 hours) at 12/30/2017 1308 Last data filed at 12/29/2017 1700 Gross per 24 hour  Intake -  Output 350 ml  Net -350 ml    LBM: Last BM Date: 12/27/17 Baseline Weight: Weight: 48.7 kg Most recent weight: Weight: 48.7 kg     Palliative Assessment/Data:   PPS 30%  Time In:  12 Time Out:  1310 Time Total:  70 min  Greater than 50%  of this time was spent counseling and coordinating care related to the above assessment and plan.  Signed by: Loistine Chance, MD  [phone] Please contact Palliative Medicine Team phone at [phone] for questions and concerns.  For individual provider: See Shea Evans

## 2017-12-31 DIAGNOSIS — Z515 Encounter for palliative care: Secondary | ICD-10-CM

## 2017-12-31 DIAGNOSIS — C259 Malignant neoplasm of pancreas, unspecified: Secondary | ICD-10-CM

## 2017-12-31 DIAGNOSIS — R1013 Epigastric pain: Secondary | ICD-10-CM

## 2017-12-31 DIAGNOSIS — K56609 Unspecified intestinal obstruction, unspecified as to partial versus complete obstruction: Secondary | ICD-10-CM

## 2017-12-31 MED ORDER — HALOPERIDOL LACTATE 5 MG/ML IJ SOLN
0.5000 mg | Freq: Three times a day (TID) | INTRAMUSCULAR | Status: DC | PRN
  Administered 2018-01-01: 0.5 mg via INTRAVENOUS
  Filled 2017-12-31: qty 1

## 2017-12-31 NOTE — Progress Notes (Signed)
New referral for inpatient hospice on 84 year old female who presented to [facility] on 12.27.19 with complaint of nausea/vomiting and abdominal pain x 2 days.  She was admitted with bowel obstruction.  Patient was recently diagnosed with Stage IV pancreatic cancer in November of this year.  She is post GI consult, who feel patient  likely has sigmoid stricture from extrinsic compression from intra-abdominal metastatic disease.  Past medical history is significant for:  DM, GERD, Hyperlipidemia, HTN, uterine cancer, thyroid disease, & diverticulosis.  She is post palliative medicine consult to discuss goals of care and for symptom management for uncontrolled abdominal pain, dyspnea, anxiety and nausea/vomiting.  The patient and her family are in agreement with the patients desire for comfort care.  She was started on a Fentanyl gtt to help manage her pain.  (Note patient has an anaphylactic reaction to morphine).  Mrs. Keil request our hospice home for symptom management as well as end of life care.  She is appropriate for inpatient hospice.   She is a DNR.  Will plan admission to the hospice home tomorrow transport time to be determined.  Patients husband, Kiira Brach [phone] cell) will plan on meeting with Anne Hahn SW this afternoon around 1500 to complete all admission paperwork.  Rx for Fentanyl gtt pending.  I have called [facility]- and they are able to handle the requested prescribed dose.  Symptoms to be managed: Scheduled Rxs: Intractable abdominal pain LLQ- requiring IV Fentanyl gtt:  Continuous 12.42mcg/hour, PCA dose 73mcg every 15 min with 1 hour lock out of 50mcg.  As of 1000 this morning, patient has received 138.3 mcg.  Yesterday, patient had 461.4 mcg.  Back pain:  Lidocaine 5% patch apply every 24 hours (on 12hrs, off 12 hrs) to left lower back- Placed today 12.30.19 @ 1256. Secretions:  Robinul 0.4mg  IV every 6 hours last dose 12.30.19 @ 1256 PRN Rx: Nausea/vomiting:   Zofran  4mg  IV every 6 hours prn                                Haldol 0.5mg  IV every 8 hours prn Anxiety:  Ativan 0.5mg  IV every 6 hours prn  PPS:  30% Current  Ht:  52   Wt:  106#   BMI:  19.6 Weight July 2019:    123# Neuro:   alert  PO intake- None- active nausea/vomiting- last episode this morning Swallowing:  yes Urine:  not incontinent Last BM:  12.26.19 Allergies:  Multiple- see list- faxed to referral intake  Pheripheral IV:  22 gauge Left Forearm placed 12.29.19  Thank you for allowing participation in this patient's care.  I will continue to follow through final disposition.  Dimas Aguas, MA, BSN, RN, [facility] [phone]

## 2017-12-31 NOTE — Progress Notes (Signed)
Pt and family have requested to have pt referred to [facility] for end of life care. Hospice liaison working with family and PMT to gather admission necessities and, while no current bed availability, expect to be able to admit pt in coming day.  Will follow.  Sharren Bridge, MSW, LCSW Clinical Social Work 12/31/2017 [phone]

## 2017-12-31 NOTE — Care Management Important Message (Signed)
Important Message  Patient Details  Name: AZARI JANSSENS MRN: [id number] Date of Birth: 1929-07-05   Medicare Important Message Given:  Yes    Kerin Salen 12/31/2017, 10:04 AMImportant Message  Patient Details  Name: JACARIA COLBURN MRN: [id number] Date of Birth: 1929-04-06   Medicare Important Message Given:  Yes    Kerin Salen 12/31/2017, 10:04 AM

## 2017-12-31 NOTE — Progress Notes (Signed)
PROGRESS NOTE  Molly [name] UXL:[id number] DOB: 1929/03/09 DOA: 12/28/2017 PCP: Tonia Ghent, MD  HPI/Recap of past 24 hours:   On full comfort measures, awaiting for residential hospice placement She reports she has been sleeping most of the day today, she is very appreciative with the care here Family at bedside   Assessment/Plan: Principal Problem:   Partial bowel obstruction ([place]) Active Problems:   Hypothyroidism   HTN (hypertension)   Hypokalemia   Type 2 diabetes mellitus ([place])   Pancreatic cancer ([place])   Bowel obstruction ([place])   Colonic obstruction ([place])   Cancer ([place])   Palliative care by specialist   Goals of care, counseling/discussion   Anxiety disorder   Large bowel obstruction with distal sigmoid colonic mass, pancreatic mass -Due to increased pain, repeat kub obtained, no perforation, colon  Dilation upto 7.9cm on kub obtained on 12/28 - on fentanyl drip, ( report morphin allergy with lip edema) palliative care input appreciated.  -Gi input appreciated  Hypokalemia: replaced k, now on comfort care  HTN: Prn iv lopressor  Hypothyroidism, d/c  IV Synthroid , focus on comfort  Code Status:  DNR  Family Communication: patient and family  Disposition Plan: residential hospice tomorrow, significant symptom burden    Consultants:  GI  Palliative care  Procedures:  none  Antibiotics:  none    Objective: BP (!) 162/78 (BP Location: Right Arm)   Pulse 88   Temp 98.2 F (36.8 C) (Oral)   Resp 16   Ht 5\' 2"  (1.575 m)   Wt 48.7 kg   SpO2 95%   BMI 19.64 kg/m   Intake/Output Summary (Last 24 hours) at 12/31/2017 1527 Last data filed at 12/31/2017 0909 Gross per 24 hour  Intake 1 ml  Output 1100 ml  Net -1099 ml   Filed Weights   12/28/17 2055 12/29/17 0329  Weight: 48.7 kg 48.7 kg    Exam: Patient is examined daily including today on 12/31/2017, exams remain the same as of yesterday except that has  changed    General:  Thin, pale, chronically ill,   Cardiovascular: RRR  Respiratory: CTABL  Abdomen: distended, firm, decreased bowel sounds, tender   Musculoskeletal: No Edema  Neuro: alert, oriented     Data Reviewed: Basic Metabolic Panel: Recent Labs  Lab 12/28/17 1546 12/29/17 0442  NA 141 139  K 3.0* 2.6*  CL 103 106  CO2 26 22  GLUCOSE 150* 137*  BUN 11 8  CREATININE 0.61 0.54  CALCIUM 9.1 8.5*  MG  --  1.9   Liver Function Tests: Recent Labs  Lab 12/28/17 1546 12/29/17 0442  AST 20 22  ALT 16 13  ALKPHOS 70 62  BILITOT 0.6 0.5  PROT 6.9 6.0*  ALBUMIN 3.5 3.0*   Recent Labs  Lab 12/28/17 1546  LIPASE 24   No results for input(s): AMMONIA in the last 168 hours. CBC: Recent Labs  Lab 12/28/17 1546 12/29/17 0442  WBC 8.7 9.6  HGB 14.8 13.3  HCT 46.8* 42.6  MCV 97.9 99.3  PLT 263 251   Cardiac Enzymes:   No results for input(s): CKTOTAL, CKMB, CKMBINDEX, TROPONINI in the last 168 hours. BNP (last 3 results) No results for input(s): BNP in the last 8760 hours.  ProBNP (last 3 results) No results for input(s): PROBNP in the last 8760 hours.  CBG: Recent Labs  Lab 12/29/17 2349 12/30/17 0358 12/30/17 0744 12/30/17 1206 12/30/17 1659  GLUCAP 148* 177* 134* 132* 123*  No results found for this or any previous visit (from the past 240 hour(s)).   Studies: No results found.  Scheduled Meds: . fentaNYL   Intravenous Q4H  . glycopyrrolate  0.4 mg Intravenous Q6H  . lidocaine  1 patch Transdermal Q24H  . mouth rinse  15 mL Mouth Rinse BID    Continuous Infusions:   Time spent: 79mins, case discussed with palliative care Dr Rowe Pavy , GI and social worker I have personally reviewed and interpreted on  12/31/2017 daily labs,  imagings as discussed above under date review session and assessment and plans.  I reviewed all nursing notes, pharmacy notes, consultant notes,  vitals, pertinent old records  I have discussed plan of  care as described above with RN , patient and family on 12/31/2017   Florencia Reasons MD, PhD  [facility] Pager [phone]. If 7PM-7AM, please contact night-coverage at [email or web address], password [facility] 12/31/2017, 3:27 PM  LOS: 3 days

## 2017-12-31 NOTE — Progress Notes (Signed)
Molly [name]   DOB:01/25/1929   NF#:[id number]   QIO#:[id number]  Oncology follow up   Subjective: Pt still has intermittent nausea, mild abdominal pain.  Overall very weak.  Patient and family has decided to pursue hospice today.    Objective:  Vitals:   12/31/17 2045 12/31/17 2102  BP: (!) 151/77   Pulse: 86   Resp: 16 16  Temp: 98.8 F (37.1 C)   SpO2: 94% 93%    Body mass index is 19.64 kg/m.  Intake/Output Summary (Last 24 hours) at 12/31/2017 2132 Last data filed at 12/31/2017 1800 Gross per 24 hour  Intake 0 ml  Output 600 ml  Net -600 ml     Sclerae unicteric  Lungs clear -- no rales or rhonchi  Heart regular rate and rhythm  Abdomen benign  MSK no focal spinal tenderness, no peripheral edema  Neuro nonfocal    CBG (last 3)  Recent Labs    12/30/17 0744 12/30/17 1206 12/30/17 1659  GLUCAP 134* 132* 123*     Labs:  Lab Results  Component Value Date   WBC 9.6 12/29/2017   HGB 13.3 12/29/2017   HCT 42.6 12/29/2017   MCV 99.3 12/29/2017   PLT 251 12/29/2017   NEUTROABS 3.0 12/18/2017   CMP Latest Ref Rng & Units 12/29/2017 12/28/2017 12/18/2017  Glucose 70 - 99 mg/dL 137(H) 150(H) 196(H)  BUN 8 - 23 mg/dL 8 11 9   Creatinine 0.44 - 1.00 mg/dL 0.54 0.61 0.71  Sodium 135 - 145 mmol/L 139 141 143  Potassium 3.5 - 5.1 mmol/L 2.6(LL) 3.0(L) 3.5  Chloride 98 - 111 mmol/L 106 103 103  CO2 22 - 32 mmol/L 22 26 33(H)  Calcium 8.9 - 10.3 mg/dL 8.5(L) 9.1 9.2  Total Protein 6.5 - 8.1 g/dL 6.0(L) 6.9 6.3(L)  Total Bilirubin 0.3 - 1.2 mg/dL 0.5 0.6 0.4  Alkaline Phos 38 - 126 U/L 62 70 73  AST 15 - 41 U/L 22 20 17   ALT 0 - 44 U/L 13 16 14     Urine Studies No results for input(s): UHGB, CRYS in the last 72 hours.  Invalid input(s): UACOL, UAPR, USPG, UPH, UTP, UGL, UKET, UBIL, UNIT, UROB, ULEU, UEPI, UWBC, URBC, UBAC, CAST, UCOM, BILUA  Basic Metabolic Panel: Recent Labs  Lab 12/28/17 1546 12/29/17 0442  NA 141 139  K 3.0* 2.6*  CL 103 106   CO2 26 22  GLUCOSE 150* 137*  BUN 11 8  CREATININE 0.61 0.54  CALCIUM 9.1 8.5*  MG  --  1.9   GFR Estimated Creatinine Clearance: 38.8 mL/min (by C-G formula based on SCr of 0.54 mg/dL). Liver Function Tests: Recent Labs  Lab 12/28/17 1546 12/29/17 0442  AST 20 22  ALT 16 13  ALKPHOS 70 62  BILITOT 0.6 0.5  PROT 6.9 6.0*  ALBUMIN 3.5 3.0*   Recent Labs  Lab 12/28/17 1546  LIPASE 24   No results for input(s): AMMONIA in the last 168 hours. Coagulation profile No results for input(s): INR, PROTIME in the last 168 hours.  CBC: Recent Labs  Lab 12/28/17 1546 12/29/17 0442  WBC 8.7 9.6  HGB 14.8 13.3  HCT 46.8* 42.6  MCV 97.9 99.3  PLT 263 251   Cardiac Enzymes: No results for input(s): CKTOTAL, CKMB, CKMBINDEX, TROPONINI in the last 168 hours. BNP: Invalid input(s): POCBNP CBG: Recent Labs  Lab 12/29/17 2349 12/30/17 0358 12/30/17 0744 12/30/17 1206 12/30/17 1659  GLUCAP 148* 177* 134* 132* 123*   D-Dimer  No results for input(s): DDIMER in the last 72 hours. Hgb A1c No results for input(s): HGBA1C in the last 72 hours. Lipid Profile No results for input(s): CHOL, HDL, LDLCALC, TRIG, CHOLHDL, LDLDIRECT in the last 72 hours. Thyroid function studies No results for input(s): TSH, T4TOTAL, T3FREE, THYROIDAB in the last 72 hours.  Invalid input(s): FREET3 Anemia work up No results for input(s): VITAMINB12, FOLATE, FERRITIN, TIBC, IRON, RETICCTPCT in the last 72 hours. Microbiology No results found for this or any previous visit (from the past 240 hour(s)).    Studies:  No results found.  Assessment: 84 y.o. with recently diagnosed metastatic pancreatic cancer to peritoneum, severe sigmoid colon stricture and bowel obstruction, presented with recurrent nausea, vomiting, abdominal pain and no bowel movement since yesterday.  1.  Bowel obstruction secondary to severe sigmoid colon stricture, unknown etiology, benign versus secondary to peritoneal  metastasis 2.  Pancreatic adenocarcinoma with peritoneal metastasis, on supportive care alone. 3. DM 4.  Hypokalemia 5.  Moderate protein and calorie malnutrition   Plan:  -her case has been discussed with GI team Drs. Mansouraty and Cirigliano, the benefit and risks fo clonic stent placement, especially risk of bowel perforation, were discussed with patient and her family in details.  Patient and family has declined the procedure and decided to enroll to hospice. I supported their decision.  -she is likely going to inpt hosp[facility] at [facility] soon. -appreciate the excellent care from the hospitalist, palliative care and GI teams.    Truitt Merle, MD 12/31/2017  9:32 PM

## 2017-12-31 NOTE — Progress Notes (Signed)
Daily Progress Note   Patient Name: Molly [name]       Date: 12/31/2017 DOB: [birth year]  Age: 84 y.o. MRN#: [id number] Attending Physician: Florencia Reasons, MD Primary Care Physician: Tonia Ghent, MD Admit Date: 12/28/2017  Reason for Consultation/Follow-up: Establishing goals of care, Non pain symptom management and Pain control  Subjective:  patient is awake alert resting in bed Still has abdominal pain, left side, some what better controlled.  The patient has had one bout of vomiting. However, she states that she experiences "waves of nausea". She also complains of burping. She is not passing gas.  Husband and son are present at the bedside.  Patient's minister has recently visited with her and prayed with her.  See below.  Length of Stay: 3  Current Medications: Scheduled Meds:  . fentaNYL   Intravenous Q4H  . glycopyrrolate  0.4 mg Intravenous Q6H  . lidocaine  1 patch Transdermal Q24H  . mouth rinse  15 mL Mouth Rinse BID    Continuous Infusions:   PRN Meds: acetaminophen **OR** acetaminophen, diphenhydrAMINE **OR** diphenhydrAMINE, haloperidol lactate, hydrALAZINE, LORazepam, metoprolol tartrate, naloxone **AND** sodium chloride flush, ondansetron **OR** ondansetron (ZOFRAN) IV  Physical Exam         Frail, extremely weak appearing lady resting in bed Awake alert Dry mouth Abdomen with the left lower quadrant pain Shallow decreased breath sounds S1-S2 Cancer related cachexia  Vital Signs: BP (!) 149/77 (BP Location: Right Arm) Comment: RN notified  Pulse 90   Temp 98 F (36.7 C) (Oral)   Resp 20   Ht '5\' 2"'$  (1.575 m)   Wt 48.7 kg   SpO2 97%   BMI 19.64 kg/m  SpO2: SpO2: 97 % O2 Device: O2 Device: Room Air O2 Flow Rate: O2 Flow Rate (L/min): 1.5  L/min  Intake/output summary:   Intake/Output Summary (Last 24 hours) at 12/31/2017 1055 Last data filed at 12/31/2017 8841 Gross per 24 hour  Intake 1 ml  Output 1100 ml  Net -1099 ml   LBM: Last BM Date: 12/27/17 Baseline Weight: Weight: 48.7 kg Most recent weight: Weight: 48.7 kg       Palliative Assessment/Data: Palliative performance scale 30%   Flowsheet Rows     Most Recent Value  Intake Tab  Referral Department  Hospitalist  Unit at Time of  Referral  Oncology Unit  Palliative Care Primary Diagnosis  Cancer  Date Notified  12/29/17  Palliative Care Type  New Palliative care  Reason for referral  Clarify Goals of Care, Non-pain Symptom, Pain  Date of Admission  12/28/17  Date first seen by Palliative Care  12/30/17  # of days Palliative referral response time  1 Day(s)  # of days IP prior to Palliative referral  1  Clinical Assessment  Psychosocial & Spiritual Assessment  Palliative Care Outcomes      Patient Active Problem List   Diagnosis Date Noted  . Palliative care by specialist   . Goals of care, counseling/discussion   . Anxiety disorder   . Colonic obstruction ([place])   . Cancer ([place])   . Bowel obstruction ([place]) 12/28/2017  . Partial bowel obstruction ([place]) 12/18/2017  . History of uterine cancer   . Family history of colon cancer   . Family history of cervical cancer   . Pancreatic cancer ([place]) 12/14/2017  . Abnormal CT scan, colon   . Constipation 12/01/2017  . Protein calorie malnutrition ([place]) 12/01/2017  . Type 2 diabetes mellitus ([place]) 12/01/2017  . Pancreatic mass 11/15/2017  . Abnormal weight loss 11/11/2017  . 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 Diabetes mellitus without complication ([place]) 98/11/9145  . Health care maintenance 01/08/2017  . Dysuria 09/08/2016  . Easy bruising 08/04/2016  . Rectal pain 01/29/2015  . Fatigue 01/23/2015  . Dehydration 01/23/2015  . Post concussion syndrome 01/23/2015  . Numbness and tingling of left  side of face   . Ruptured globe 01/21/2015  . Hypokalemia 01/21/2015  . Osteopenia 01/01/2015  . Vitamin D deficiency 11/25/2014  . Cough 10/01/2014  . Chronic RLQ pain 03/12/2014  . Advance care planning 11/24/2013  . Cystocele 11/24/2013  . Medicare annual wellness visit, subsequent 12020-09-2612  . Glaucoma 12020-09-2612  . Abdominal pain 10/25/2011  . Diverticulosis 08/18/2011  . Postmenopausal HRT (hormone replacement therapy) 07/18/2011  . Back pain 07/18/2011  . Shoulder pain 07/18/2011  . GERD 09/14/2008  . TIBIALIS TENDINITIS 07/30/2008  . PAIN IN JOINT, MULTIPLE SITES 06/02/2008  . INTERMITTENT CLAUDICATION, LEFT LEG 11/19/2007  . Hypothyroidism 05/28/2007  . HYPERLIPIDEMIA, MIXED 05/28/2007  . HTN (hypertension) 05/28/2007  . Disorder of bone and cartilage 05/28/2007  . IMPAIRED FASTING GLUCOSE 05/28/2007    Palliative Care Assessment & Plan   Patient Profile:   Assessment: Large bowel obstruction with distal sigmoid colonic mass Enlarging pancreatic mass, recently diagnosed with pancreatic cancer.  Possible metallic net lung nodules Abdominal pain Functional status decline Nausea vomiting uncontrolled Frailty, deconditioning Generalized anxiety   Recommendations/Plan:  Family meeting: I met with the patient, husband and son who are now present at the bedside. The patient's minister visited with her recently and prayed with her. Also discussed with bedside RN. The patient rested somewhat better last night. She had one bout of vomiting. However she has episodic severe nausea going on. She has episodic at times uncontrolled left lower quadrant abdominal pain going on. Recently, plain films were done with x-rays showing increasing size of the colon. CT scan also reviewed in detail.  Goals wishes and values attempted to be discussed. Discussed about hospice philosophy of care. Discussed about aggressive symptom management. Patient's mother died inside hospice of  [facility]. The patient describes it as a very helpful and compassionate experience.  Frankly discussed about severity of the patient's symptoms as well as the serious incurable nature of her overlying illness. Patient  states that what is most important to her at this time is to stay as comfortable as possible. We discussed about pain regimen we discussed about anti-emetic regimen in addition to judicious use of benzodiazepines. Patient would like to continue with comfort measures only. Discussed with husband and son also present at the bedside.  Patient has a true allergy to morphine and is tolerating the fentanyl PCA quite well. Would recommend continuation of the same. Additionally patient is on scheduled Robinul as well as on Ativan. Would recommend continuation of the same. Will attempt low-dose haloperidol as an anti-emetic to see if this will help with the patient's sensation of nausea.  Frankly discussed with the patient that her prognosis appears markedly limited at this time. She wishes to be Most comfortable as well as for her family to be supported.  It appears that a residential hospice that can continue aggressive symptom management at end-of-life appears to be most prudent. Will request social work assistance.  Code Status:    Code Status Orders  (From admission, onward)         Start     Ordered   12/29/17 1504  Do not attempt resuscitation (DNR)  Continuous    Question Answer Comment  In the event of cardiac or respiratory ARREST Do not call a "code blue"   In the event of cardiac or respiratory ARREST Do not perform Intubation, CPR, defibrillation or ACLS   In the event of cardiac or respiratory ARREST Use medication by any route, position, wound care, and other measures to relive pain and suffering. May use oxygen, suction and manual treatment of airway obstruction as needed for comfort.      12/29/17 1503        Code Status History    Date  Active Date Inactive Code Status Order ID Comments User Context   12/28/2017 2332 12/29/2017 1503 Full Code [id number]  [name], MD Inpatient   12/01/2017 2137 12/04/2017 1547 Full Code [id number]  Shela Leff, MD ED   01/23/2015 0415 01/24/2015 1613 Full Code [id number]  Edwin Dada, MD Inpatient   01/21/2015 0337 01/22/2015 1511 Full Code [id number]  Danford, Suann Larry, MD Inpatient    Advance Directive Documentation     Most Recent Value  Type of Advance Directive  Healthcare Power of Attorney, Living will  Pre-existing out of facility DNR order (yellow form or pink MOST form)  -  "MOST" Form in Place?  -       Prognosis:   < 2 weeks  Discharge Planning:  Hospice facility  Care plan was discussed with patient, bed side RN, social worker, GI,TRH M.D., family including husband and son.  Thank you for allowing the Palliative Medicine Team to assist in the care of this patient.   Time In: 10.25 Time Out: 11.00 Total Time 35 Prolonged Time Billed  no       Greater than 50%  of this time was spent counseling and coordinating care related to the above assessment and plan.  Loistine Chance, MD [phone] Please contact Palliative Medicine Team phone at [phone] for questions and concerns.

## 2017-12-31 NOTE — Progress Notes (Signed)
[facility] Progress Note  CC:  Colonic obstruction  Subjective:  Vomiting and nausea much improved but still present.  Abdominal pain seems to be some better too as long as she does not move around much.  Colonoscopy earlier this month showed the following:  - Severe stenosis of the sigmoid colon without evident mucosal neoplasm at the distal end of the stricture I was unable to advance the colonoscope or a adult gastroscope through the stenosis. This MAY be from a primary colon cancer however I feel it is more likely related to the known metastatic pancreatic cancer (extrinsically compressing the sigmoid colon).  But other imaging suggesting colonic malignancy.  Objective:  Vital signs in last 24 hours: Temp:  [97.8 F (36.6 C)-98.5 F (36.9 C)] 98 F (36.7 C) (12/30 0537) Pulse Rate:  [82-90] 90 (12/30 0537) Resp:  [16-26] 20 (12/30 0537) BP: (135-150)/(77-82) 149/77 (12/30 0537) SpO2:  [95 %-99 %] 98 % (12/30 0537) Last BM Date: 12/27/17 General:  Alert, ill-appearing, frail Heart:  Regular rate and rhythm; no murmurs Pulm:  CTAB.  No increased WOB. Abdomen:  Soft, distended.  No bowel sounds noted.  Left sided TTP. Extremities:  Without edema. Neurologic:  Alert and oriented x 4;  grossly normal neurologically.  Intake/Output from previous day: 12/29 0701 - 12/30 0700 In: 1 [I.V.:1] Out: 1050 [Urine:550] Intake/Output this shift: Total I/O In: -  Out: 50 [Urine:50]  Lab Results: Recent Labs    12/28/17 1546 12/29/17 0442  WBC 8.7 9.6  HGB 14.8 13.3  HCT 46.8* 42.6  PLT 263 251   BMET Recent Labs    12/28/17 1546 12/29/17 0442  NA 141 139  K 3.0* 2.6*  CL 103 106  CO2 26 22  GLUCOSE 150* 137*  BUN 11 8  CREATININE 0.61 0.54  CALCIUM 9.1 8.5*   LFT Recent Labs    12/29/17 0442  PROT 6.0*  ALBUMIN 3.0*  AST 22  ALT 13  ALKPHOS 62  BILITOT 0.5  BILIDIR 0.2  IBILI 0.3   Dg Abd 1 View  Result Date: 12/29/2017 CLINICAL  DATA:  Abdominal pain EXAM: ABDOMEN - 1 VIEW COMPARISON:  12/28/2017 FINDINGS: Dilated gas-filled loops of large bowel are identified in the mid abdomen and right lower quadrant up to 7.9 cm. No significant small bowel dilatation is identified. There is no free air. Contrast filled nondistended bladder projects within the pelvis. There is mild dextroconvex curvature of the lower lumbar spine without acute nor aggressive osseous lesions. Cholecystectomy clips are present in the right upper quadrant. IMPRESSION: Nonspecific bowel gas pattern with gas filled dilatation of large bowel to 7.9 cm, in part due to patient's known sigmoid and proximal rectal mass. More distal fluid-filled large bowel is not entirely excluded but is not apparent radiographically. Electronically Signed   By: Ashley Royalty M.D.   On: 12/29/2017 15:18   Assessment / Plan: *Metastatic pancreatic cancer with the sigmoid colon obstruction, ? Colonic malignancy vs stricture/extrinsic compression from intraabdominal metastatic disease.  Risk of perforation with placing stent could be as much as 20% if it is non-malignant stricture.  Discussed with palliative care, patient, patient's husband, and patient's son.  Stent would not extend life expectancy, which they are estimating as 2 weeks or less.  If performed then the goal would be to provide comfort by possibly relieving obstruction, but once again comes with significant potential risk.  For now we have decided to proceed with symptom management of pain  and nausea/vomiting with medications alone.  Patient going to residential hospice.   LOS: 3 days   Laban Emperor. Zehr  12/31/2017, 9:49 AM

## 2018-01-01 DIAGNOSIS — G893 Neoplasm related pain (acute) (chronic): Secondary | ICD-10-CM

## 2018-01-01 MED ORDER — FENTANYL 40 MCG/ML IV SOLN: INTRAVENOUS | Status: DC

## 2018-01-01 MED ORDER — FENTANYL CITRATE (PF) 100 MCG/2ML IJ SOLN: 50.0000 ug | Freq: Once | INTRAMUSCULAR | Status: DC

## 2018-01-01 NOTE — Progress Notes (Signed)
11cc's of Fentanyl PCA wasted in sharps. Witnessed by Threasa Beards, RN

## 2018-01-01 NOTE — Discharge Summary (Signed)
Discharge Summary  Molly [name] MRN:[id number] DOB: 08/13/1929  PCP: Duncan, Graham S, MD  Admit date: 12/28/2017 Discharge date: 01/01/2018  Time spent: 45mins, more than 50% time spent on coordination of care.  Discharge to residential hospice on full comfort measures /fentanyl pca pump  Discharge Diagnoses:  Active Hospital Problems   Diagnosis Date Noted  . Partial bowel obstruction (HCC) 12/18/2017  . Comfort measures only status   . Palliative care by specialist   . Goals of care, counseling/discussion   . Anxiety disorder   . Colonic obstruction (HCC)   . Cancer (HCC)   . Bowel obstruction (HCC) 12/28/2017  . Pancreatic cancer (HCC) 12/14/2017  . Type 2 diabetes mellitus (HCC) 12/01/2017  . Hypokalemia 01/21/2015  . Hypothyroidism 05/28/2007  . HTN (hypertension) 05/28/2007    Resolved Hospital Problems  No resolved problems to display.    Discharge Condition: stable  Diet recommendation: comfort feeds as tolerated  Filed Weights   12/28/17 2055 12/29/17 0329  Weight: 48.7 kg 48.7 kg    History of present illness: (per admitting MD Dr Kakrakandy) PCP: Duncan, Graham S, MD  Patient coming from: Home.  Chief Complaint: Abdominal pain with nausea vomiting.  HPI: Molly [name] is a 84 y.o. female with history of pancreatic cancer metastatic and the supportive care, hypertension, hypothyroidism presents to the ER with complaints of increasing abdominal pain with nausea vomiting over the last 2 days.  Pain is mostly in the left lower quadrant.  Has not moved bowels for last 2 days.  Patient has a known history of stricture in the sigmoid area of unknown cause.  ED Course: CT of the abdomen and pelvis done in the ER shows mild colon dilatation with multiple air-fluid levels with distal bowel obstruction colorectal neoplasm.  Patient oncologist has already seen the patient and has requested GI consult with Dr. Mann/Dr. Mansouraty for possible stent  placement.   Hospital Course:  Principal Problem:   Partial bowel obstruction (HCC) Active Problems:   Hypothyroidism   HTN (hypertension)   Hypokalemia   Type 2 diabetes mellitus (HCC)   Pancreatic cancer (HCC)   Bowel obstruction (HCC)   Colonic obstruction (HCC)   Cancer (HCC)   Palliative care by specialist   Goals of care, counseling/discussion   Anxiety disorder   Comfort measures only status   Large bowelobstruction with distal sigmoid colonic mass, pancreatic mass -Due to increased pain, repeat kub obtained, no perforation, colon Dilation upto 7.9cm on kub obtained on 12/28 - on fentanyl drip, ( report morphine allergy with lip edema) palliative care input appreciated.  -Gi input appreciated Patient elected to start on full comfort measures and residential hospice placement  Hypokalemia:replaced k, now on comfort care  HTN: Prn iv lopressor now on comfort care  Hypothyroidism,d/c  IV Synthroid , focus on comfort  Code Status: DNR  Family Communication:patient and husband at bedside  Disposition Plan:residential hospice, significant symptom burden   Consultants:  GI  Palliative care  Procedures:  none  Antibiotics:  none   Discharge Exam: BP (!) 143/79 (BP Location: Right Arm)   Pulse 87   Temp 97.6 F (36.4 C) (Oral)   Resp (!) 21   Ht 5' 2" (1.575 m)   Wt 48.7 kg   SpO2 95%   BMI 19.64 kg/m   General: sleeping, does not appear in distress, on room air    Discharge Instructions    Diet general   Complete by:  As directed      Comfort feeds if patient desires   Increase activity slowly   Complete by:  As directed      Allergies as of 01/01/2018      Reactions   Erythromycin    Intolerant   Ezetimibe-simvastatin    REACTION: MUSCLE ACHES AND PAINS.    Latex Swelling   "thrush" type rash and swelling   Macrodantin [nitrofurantoin Macrocrystal] Nausea Only   Metformin And Related Other (See Comments)    Abdominal pain with short and long acting metformin.     Morphine    Chest pain   Other Other (See Comments)   novacaine-sick   Petroleum Jelly [skin Protectants, Misc.] Other (See Comments)   Skin irritation.    Preparation H [phenyleph-shark Liv Oil-mo-pet] Other (See Comments)   Local irritation, burning.    Procaine Hcl    Can tolerate lidocaine   Simvastatin    myalgias   Sulfamethoxazole Other (See Comments)   Does not agree with her   White Petrolatum Other (See Comments)   Skin irritation.    Adhesive [tape] Rash   Tape Rash   Rash Rash      Medication List    STOP taking these medications   acetaminophen 500 MG tablet Commonly known as:  TYLENOL   ALPRAZolam 0.25 MG tablet Commonly known as:  XANAX   atenolol 25 MG tablet Commonly known as:  TENORMIN   Biotin 10 MG Caps   Cholecalciferol 50 MCG (2000 UT) Caps Commonly known as:  [employer] VITAMIN D   COQ10 PO   ESTROVEN MENOPAUSE RELIEF Caps   glucose blood test strip Commonly known as:  ONETOUCH VERIO   levothyroxine 88 MCG tablet Commonly known as:  SYNTHROID, LEVOTHROID   lidocaine 5 % ointment Commonly known as:  XYLOCAINE   multivitamin tablet   Na Sulfate-K Sulfate-Mg Sulf 17.5-3.13-1.6 GM/177ML Soln   ONETOUCH DELICA LANCETS 33G Misc   ONETOUCH VERIO FLEX SYSTEM w/Device Kit   polyethylene glycol packet Commonly known as:  MIRALAX / GLYCOLAX      Allergies  Allergen Reactions  . Erythromycin     Intolerant  . Ezetimibe-Simvastatin     REACTION: MUSCLE ACHES AND PAINS.   . Latex Swelling    "thrush" type rash and swelling  . Macrodantin [Nitrofurantoin Macrocrystal] Nausea Only  . Metformin And Related Other (See Comments)    Abdominal pain with short and long acting metformin.    . Morphine     Chest pain  . Other Other (See Comments)    novacaine-sick  . Petroleum Jelly [Skin Protectants, Misc.] Other (See Comments)    Skin irritation.   . Preparation H [Phenyleph-Shark Liv  Oil-Mo-Pet] Other (See Comments)    Local irritation, burning.   . Procaine Hcl     Can tolerate lidocaine  . Simvastatin     myalgias  . Sulfamethoxazole Other (See Comments)    Does not agree with her  . White Petrolatum Other (See Comments)    Skin irritation.   . Adhesive [Tape] Rash  . Tape Rash    Rash Rash      The results of significant diagnostics from this hospitalization (including imaging, microbiology, ancillary and laboratory) are listed below for reference.    Significant Diagnostic Studies: Dg Abd 1 View  Result Date: 12/29/2017 CLINICAL DATA:  Abdominal pain EXAM: ABDOMEN - 1 VIEW COMPARISON:  12/28/2017 FINDINGS: Dilated gas-filled loops of large bowel are identified in the mid abdomen and right lower quadrant up to 7.9   cm. No significant small bowel dilatation is identified. There is no free air. Contrast filled nondistended bladder projects within the pelvis. There is mild dextroconvex curvature of the lower lumbar spine without acute nor aggressive osseous lesions. Cholecystectomy clips are present in the right upper quadrant. IMPRESSION: Nonspecific bowel gas pattern with gas filled dilatation of large bowel to 7.9 cm, in part due to patient's known sigmoid and proximal rectal mass. More distal fluid-filled large bowel is not entirely excluded but is not apparent radiographically. Electronically Signed   By: David  Kwon M.D.   On: 12/29/2017 15:18   Ct Abdomen Pelvis W Contrast  Result Date: 12/28/2017 CLINICAL DATA:  88-year-old female with known pancreatic cancer. Abdominal pain with nausea and vomiting since yesterday evening. No bowel movement in the past 2 days. EXAM: CT ABDOMEN AND PELVIS WITH CONTRAST TECHNIQUE: Multidetector CT imaging of the abdomen and pelvis was performed using the standard protocol following bolus administration of intravenous contrast. CONTRAST:  100mL ISOVUE-300 IOPAMIDOL (ISOVUE-300) INJECTION 61% COMPARISON:  CT the abdomen and  pelvis 12/01/2017. FINDINGS: Lower chest: New 7 x 5 mm (mean diameter of 6 mm) nodule in the medial aspect of the right lower lobe (axial image 29 of series 4). Areas of bronchiectasis and scarring in the inferior segment of the lingula. Hepatobiliary: No suspicious cystic or solid hepatic lesions. Mild intrahepatic biliary ductal dilatation, similar to the prior study. Common bile duct measures 10 mm in the porta hepatis. Status post cholecystectomy. Pancreas: In the body of the pancreas there is again a large hypovascular mass measuring 3.5 x 5.0 cm (axial image 25 of series 2). This is associated with proximal pancreatic ductal dilatation throughout the distal body and tail of the pancreas. Spleen: Several well-defined low-attenuation lesions in the spleen are stable in size and appearance to the prior study, and although not characterized are favored to be benign. Adrenals/Urinary Tract: Low-attenuation lesions in the left kidney compatible with simple cysts, largest of which measures 1.7 cm in the lower pole of the left kidney. Other subcentimeter low-attenuation lesions are noted in the left kidney, too small to characterize, but favored to represent tiny cysts. Right kidney and bilateral adrenal glands are normal in appearance. No hydroureteronephrosis. Urinary bladder is normal in appearance. Stomach/Bowel: Normal appearance of the stomach. No pathologic dilatation of small bowel. Mild dilatation of the colon, most evident in the region of the cecum measuring 7.4 cm in diameter. Multiple air-fluid levels noted throughout the colon. There is extensive asymmetric mural thickening and enhancement in the distal sigmoid colon and proximal rectum, best appreciated on axial images 67 and 62 of series 2, highly concerning for primary colorectal neoplasm. Some thickening of the mesorectal fascia is noted on the left side measuring up to 1.6 x 0.7 cm (axial image 68 of series 2). Vascular/Lymphatic: Aortic  atherosclerosis, without evidence of aneurysm or dissection in the abdominal or pelvic vasculature. Previously described pancreatic mass is intimately associated with the superior mesenteric vein, splenic vein and splenoportal confluence, which appear mildly narrowed by the lesion. There is an intact fat plane between the lesion and the superior mesenteric artery. No involvement of the celiac axis, although the splenic artery and common hepatic artery both come in close proximity to the lesion without discrete intervening fat plane. Several borderline enlarged and mildly enlarged left inguinal lymph nodes are noted measuring up to 12 mm in short axis (axial image 68 of series 2). Reproductive: Status post hysterectomy. Ovaries are not confidently identified may be   surgically absent or atrophic. Other: Trace volume of ascites. No pneumoperitoneum. Multiple enhancing peritoneal nodules are again noted, largest of which are in the anterior aspect of the mid abdomen measuring up to 1.0 x 1.6 cm and 1.6 x 1.3 cm on axial images 50 and 49 of series 2 respectively. Some of these soft tissue nodules extend into the omental fat within a small umbilical hernia. Musculoskeletal: There are no aggressive appearing lytic or blastic lesions noted in the visualized portions of the skeleton. IMPRESSION: 1. Mild dilatation of the colon with multiple air-fluid levels suggestive of very mild distal obstruction related to enlarging colorectal neoplasm involving distal sigmoid colon and proximal rectum, as discussed above. Some adjacent thickening of the mesorectal fascia on the left side concerning for local metastatic disease. 2. Previously noted pancreatic mass is slightly larger than the prior examination, currently measuring 5.0 x 3.5 cm. Peritoneal metastatic disease appears slightly progressive compared to the prior study with slight enlargement of peritoneal implants. There is also some new left inguinal lymphadenopathy which  also appears slightly progressive compared to the prior study. 3. New 7 x 5 mm nodule in the medial aspect of the right lower lobe. This is nonspecific and may be infectious or inflammatory, however, the possibility of metastatic disease to the right lower lobe should be considered, and close attention on follow-up studies is recommended. 4. Additional incidental findings, as above. Electronically Signed   By: Vinnie Langton M.D.   On: 12/28/2017 18:57   Ct Biopsy  Result Date: 12/06/2017 CLINICAL DATA:  Pancreatic mass. Hypermetabolic: Lesion. Hypermetabolic peritoneal nodules. Biopsy requested. EXAM: CT GUIDED CORE BIOPSY OF PERITONEAL NODULE ANESTHESIA/SEDATION: Intravenous Fentanyl and Versed were administered as conscious sedation during continuous monitoring of the patient's level of consciousness and physiological / cardiorespiratory status by the radiology RN, with a total moderate sedation time of 15 minutes. PROCEDURE: The procedure risks, benefits, and alternatives were explained to the patient. Questions regarding the procedure were encouraged and answered. The patient understands and consents to the procedure. Select axial scans through the mid abdomen were obtained. The anterior peritoneal nodules at the level of the infraumbilical hernia were localized and an appropriate skin entry site determined and marked. The operative field was prepped with chlorhexidinein a sterile fashion, and a sterile drape was applied covering the operative field. A sterile gown and sterile gloves were used for the procedure. Local anesthesia was provided with 1% Lidocaine. Under CT fluoroscopic guidance, a 17 gauge trocar needle was advanced to the margin of the lesion. Once needle tip position was confirmed, coaxial 18-gauge core biopsy samples were obtained, submitted in formalin to surgical pathology. The guide needle was removed. Postprocedure scans show no hemorrhage or other apparent complication. The patient  tolerated the procedure well. COMPLICATIONS: None immediate FINDINGS: Anterior peritoneal nodules associated with infraumbilical hernia were localized. Representative core biopsy samples obtained as above. IMPRESSION: 1. Technically successful CT-guided core biopsy, peritoneal nodule. Electronically Signed   By: Lucrezia Europe M.D.   On: 12/06/2017 12:33    Microbiology: No results found for this or any previous visit (from the past 240 hour(s)).   Labs: Basic Metabolic Panel: Recent Labs  Lab 12/28/17 1546 12/29/17 0442  NA 141 139  K 3.0* 2.6*  CL 103 106  CO2 26 22  GLUCOSE 150* 137*  BUN 11 8  CREATININE 0.61 0.54  CALCIUM 9.1 8.5*  MG  --  1.9   Liver Function Tests: Recent Labs  Lab 12/28/17 1546 12/29/17 0442  AST 20 22  ALT 16 13  ALKPHOS 70 62  BILITOT 0.6 0.5  PROT 6.9 6.0*  ALBUMIN 3.5 3.0*   Recent Labs  Lab 12/28/17 1546  LIPASE 24   No results for input(s): AMMONIA in the last 168 hours. CBC: Recent Labs  Lab 12/28/17 1546 12/29/17 0442  WBC 8.7 9.6  HGB 14.8 13.3  HCT 46.8* 42.6  MCV 97.9 99.3  PLT 263 251   Cardiac Enzymes: No results for input(s): CKTOTAL, CKMB, CKMBINDEX, TROPONINI in the last 168 hours. BNP: BNP (last 3 results) No results for input(s): BNP in the last 8760 hours.  ProBNP (last 3 results) No results for input(s): PROBNP in the last 8760 hours.  CBG: Recent Labs  Lab 12/29/17 2349 12/30/17 0358 12/30/17 0744 12/30/17 1206 12/30/17 1659  GLUCAP 148* 177* 134* 132* 123*       Signed:    MD, PhD  [facility] 01/01/2018, 11:12 AM    

## 2018-01-01 NOTE — Progress Notes (Signed)
Pt discharged to hospice home. Report called to Santiago Glad, RN at facility. Bolus dose of Fentanyl given prior to transport.

## 2018-01-01 NOTE — Progress Notes (Signed)
Daily Progress Note   Patient Name: Molly [name]       Date: 01/01/2018 DOB: May 19, 1929  Age: 84 y.o. MRN#: [id number] Attending Physician: Florencia Reasons, MD Primary Care Physician: Tonia Ghent, MD Admit Date: 12/28/2017  Reason for Consultation/Follow-up: Establishing goals of care, Non pain symptom management and Pain control  Subjective: Patient is sleepy but awake. Resting in bed.  Husband and son present.  Still has abdominal, neck pain.  Son reports much more restless night with pain.  Has not been getting enough through PCA due to 1 hour dose limit.  Pump review reveals 582mcg of dilaudid in the last 24 hours.  See below.  Length of Stay: 4  Current Medications: Scheduled Meds:  . fentaNYL (SUBLIMAZE) injection  50 mcg Intravenous Once  . fentaNYL   Intravenous Q4H  . lidocaine  1 patch Transdermal Q24H  . mouth rinse  15 mL Mouth Rinse BID    Continuous Infusions:   PRN Meds: acetaminophen **OR** acetaminophen, diphenhydrAMINE **OR** diphenhydrAMINE, haloperidol lactate, hydrALAZINE, LORazepam, metoprolol tartrate, naloxone **AND** sodium chloride flush, ondansetron **OR** ondansetron (ZOFRAN) IV  Physical Exam         Frail, extremely weak appearing lady resting in bed Awake alert Dry mouth Abdomen with the left lower quadrant pain Shallow decreased breath sounds S1-S2 Cancer related cachexia  Vital Signs: BP (!) 143/79 (BP Location: Right Arm)   Pulse 87   Temp 97.6 F (36.4 C) (Oral)   Resp (!) 21   Ht 5\' 2"  (1.575 m)   Wt 48.7 kg   SpO2 95%   BMI 19.64 kg/m  SpO2: SpO2: 95 % O2 Device: O2 Device: Room Air O2 Flow Rate: O2 Flow Rate (L/min): 0 L/min  Intake/output summary:   Intake/Output Summary (Last 24 hours) at 01/01/2018 1255 Last data  filed at 01/01/2018 1200 Gross per 24 hour  Intake 0 ml  Output 400 ml  Net -400 ml   LBM: Last BM Date: 12/27/17 Baseline Weight: Weight: 48.7 kg Most recent weight: Weight: 48.7 kg       Palliative Assessment/Data: Palliative performance scale 30%   Flowsheet Rows     Most Recent Value  Intake Tab  Referral Department  Hospitalist  Unit at Time of Referral  Oncology Unit  Palliative Care Primary Diagnosis  Cancer  Date Notified  12/29/17  Palliative Care Type  New Palliative care  Reason for referral  Clarify Goals of Care, Non-pain Symptom, Pain  Date of Admission  12/28/17  Date first seen by Palliative Care  12/30/17  # of days Palliative referral response time  1 Day(s)  # of days IP prior to Palliative referral  1  Clinical Assessment  Psychosocial & Spiritual Assessment  Palliative Care Outcomes      Patient Active Problem List   Diagnosis Date Noted  . Comfort measures only status   . Palliative care by specialist   . Goals of care, counseling/discussion   . Anxiety disorder   . Colonic obstruction ([place])   . Cancer ([place])   . Bowel obstruction ([place]) 12/28/2017  . Partial bowel obstruction ([place]) 12/18/2017  . History of uterine cancer   . Family history of colon cancer   . Family history of cervical cancer   . Pancreatic cancer ([place]) 12/14/2017  . Abnormal CT scan, colon   . Constipation 12/01/2017  . Protein calorie malnutrition ([place]) 12/01/2017  . Type 2 diabetes mellitus ([place]) 12/01/2017  . Pancreatic mass 11/15/2017  . Abnormal weight loss 11/11/2017  . 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 Diabetes mellitus without complication ([place]) 42/68/3419  . Health care maintenance 01/08/2017  . Dysuria 09/08/2016  . Easy bruising 08/04/2016  . Rectal pain 01/29/2015  . Fatigue 01/23/2015  . Dehydration 01/23/2015  . Post concussion syndrome 01/23/2015  . Numbness and tingling of left side of face   . Ruptured globe 01/21/2015  . Hypokalemia 01/21/2015    . Osteopenia 01/01/2015  . Vitamin D deficiency 11/25/2014  . Cough 10/01/2014  . Chronic RLQ pain 03/12/2014  . Advance care planning 11/24/2013  . Cystocele 11/24/2013  . Medicare annual wellness visit, subsequent 12020/05/2512  . Glaucoma 12020/05/2512  . Abdominal pain 10/25/2011  . Diverticulosis 08/18/2011  . Postmenopausal HRT (hormone replacement therapy) 07/18/2011  . Back pain 07/18/2011  . Shoulder pain 07/18/2011  . GERD 09/14/2008  . TIBIALIS TENDINITIS 07/30/2008  . PAIN IN JOINT, MULTIPLE SITES 06/02/2008  . INTERMITTENT CLAUDICATION, LEFT LEG 11/19/2007  . Hypothyroidism 05/28/2007  . HYPERLIPIDEMIA, MIXED 05/28/2007  . HTN (hypertension) 05/28/2007  . Disorder of bone and cartilage 05/28/2007  . IMPAIRED FASTING GLUCOSE 05/28/2007    Palliative Care Assessment & Plan   Patient Profile:   Assessment: Large bowel obstruction with distal sigmoid colonic mass Enlarging pancreatic mass, recently diagnosed with pancreatic cancer.  Possible metallic net lung nodules Abdominal pain Functional status decline Nausea vomiting uncontrolled Frailty, deconditioning Generalized anxiety   Recommendations/Plan: - Plan for transition to residential hospice today.   - Pain: Recommend increase fentanyl basal rate to 23mcg/hr.  Continue PCA at 57mcg with 15 minute lockout but will remove 1 hour dose limit.  Code Status:    Code Status Orders  (From admission, onward)         Start     Ordered   12/29/17 1504  Do not attempt resuscitation (DNR)  Continuous    Question Answer Comment  In the event of cardiac or respiratory ARREST Do not call a "code blue"   In the event of cardiac or respiratory ARREST Do not perform Intubation, CPR, defibrillation or ACLS   In the event of cardiac or respiratory ARREST Use medication by any route, position, wound care, and other measures to relive pain and suffering. May use oxygen, suction and manual treatment of airway obstruction as  needed for comfort.  12/29/17 1503        Code Status History    Date Active Date Inactive Code Status Order ID Comments User Context   12/28/2017 2332 12/29/2017 1503 Full Code [id number]  [name], MD Inpatient   12/01/2017 2137 12/04/2017 1547 Full Code [id number]  Shela Leff, MD ED   01/23/2015 0415 01/24/2015 1613 Full Code [id number]  Edwin Dada, MD Inpatient   01/21/2015 0337 01/22/2015 1511 Full Code [id number]  Danford, Suann Larry, MD Inpatient    Advance Directive Documentation     Most Recent Value  Type of Advance Directive  Healthcare Power of Attorney, Living will  Pre-existing out of facility DNR order (yellow form or pink MOST form)  -  "MOST" Form in Place?  -       Prognosis:   < 2 weeks  Discharge Planning:  Hospice facility  Care plan was discussed with patient, bed side RN, Education officer, museum, family including husband and son.  Thank you for allowing the Palliative Medicine Team to assist in the care of this patient.   Time In: 0910 Time Out: 0950 Total Time 40 Prolonged Time Billed  no       Greater than 50%  of this time was spent counseling and coordinating care related to the above assessment and plan.  Micheline Rough, MD  Please contact Palliative Medicine Team phone at [phone] for questions and concerns.

## 2018-01-01 NOTE — Progress Notes (Signed)
Pt admitting to [facility]- report [phone]. Arranging [facility] transportation. Family completed admission paperwork with hospice- agreeable to plan today.  Sharren Bridge, MSW, LCSW Clinical Social Work 01/01/2018 [phone]

## 2018-01-02 ENCOUNTER — Telehealth: Payer: Self-pay | Admitting: Family Medicine

## 2018-01-02 NOTE — Telephone Encounter (Signed)
Routed as FYI

## 2018-01-02 NOTE — Telephone Encounter (Signed)
Called patient's husband.  Patient has been moved to inpatient hospice facility.  Offered my condolences and support.  He thanked me for the call.  I appreciate the help of all in caring for this kind lady.

## 2018-01-04 ENCOUNTER — Ambulatory Visit: Payer: PPO

## 2018-01-04 ENCOUNTER — Encounter (HOSPITAL_COMMUNITY): Payer: Self-pay | Admitting: Hematology

## 2018-01-07 ENCOUNTER — Encounter: Payer: PPO | Admitting: Family Medicine

## 2018-01-09 ENCOUNTER — Telehealth: Payer: Self-pay | Admitting: Family Medicine

## 2018-01-09 NOTE — Telephone Encounter (Signed)
Called patient's husband upon hearing that the patient had died.  I offered my condolences.  Also talked with their daughter and offered the same. They thanked me for the call. I was always really glad to see this kind lady in clinic.  She was highly respected in the community and she was loved by her family.  She will be missed.

## 2018-01-10 ENCOUNTER — Ambulatory Visit: Payer: PPO | Admitting: Gastroenterology

## 2018-01-10 ENCOUNTER — Ambulatory Visit: Payer: PPO | Admitting: Family Medicine

## 2018-01-15 ENCOUNTER — Ambulatory Visit: Payer: PPO | Admitting: Hematology

## 2018-01-15 ENCOUNTER — Other Ambulatory Visit: Payer: PPO

## 2018-01-25 ENCOUNTER — Ambulatory Visit: Payer: Self-pay | Admitting: Licensed Clinical Social Worker

## 2018-01-25 ENCOUNTER — Encounter: Payer: Self-pay | Admitting: Licensed Clinical Social Worker

## 2018-01-25 ENCOUNTER — Telehealth: Payer: Self-pay | Admitting: Licensed Clinical Social Worker

## 2018-01-25 DIAGNOSIS — Z1379 Encounter for other screening for genetic and chromosomal anomalies: Secondary | ICD-10-CM

## 2018-01-25 NOTE — Progress Notes (Addendum)
HPI:  Ms. Bove was previously seen in the [place] clinic on 12/18/2017 due to a personal and family history of cancer and concerns regarding a hereditary predisposition to cancer. Please refer to our prior cancer genetics clinic note for more information regarding Ms. Osberg's medical, social and family histories, and our assessment and recommendations, at the time. Ms. Voshell recent genetic test results were disclosed to her husband, Cyrstal Leitz as well as recommendations warranted by these results. These results and recommendations are discussed in more detail below.  CANCER HISTORY:  Oncology History   Cancer Staging Pancreatic cancer [facility]) Staging form: Exocrine Pancreas, AJCC 8th Edition - Clinical stage from 12/06/2017: Stage IV (cT3, cN0, pM1) - Signed by Truitt Merle, MD on 12/14/2017       Pancreatic cancer ([place])   11/13/2017 Imaging    CT AP W Contrast 11/13/17  IMPRESSION: 1. There are new findings since prior exam with the 2 dominant findings involving the pancreatic body and sigmoid colon as discussed above and below. 2. New from the prior examination is a 4 x 3.2 x 3.3 cm pancreatic body mass which is causing obstruction and subsequent dilation of the pancreatic duct in the region of pancreatic tail. This is highly suspicious for pancreatic malignancy. This impresses upon the splenic vein, superior aspect of the superior mesenteric vein and confluence of the portal vein. This partially surrounds the common hepatic artery. Extension towards the posterior gastric wall which may be involved. Given the significant growth in a relatively short period time, inflammatory process can be considered but is felt to be less likely consideration. 3. Abnormal appearance of the mid sigmoid colon. Two walls are apposed with masslike projection between the 2 loops. This is in the setting of diverticulosis (series 2 image 60). Question result of muscular hypertrophy,  diverticulitis, primary sigmoid mass and less likely metastatic disease. 4. 3 nodular density seen within anterior abdominal wall hernia measuring up to 1.6 cm are new from prior CT which in the present clinical setting is suspicious for spread of tumor. 5. Increase in number of low-density lesions within the spleen. Not all can be confirmed as simple cysts. This would be an unusual configuration for metastatic disease which cannot be entirely excluded. 6. Fatty liver with intrahepatic biliary duct dilation, tiny low-density lesions and calcifications similar to prior exam. 7. Aortic Atherosclerosis (ICD10-I70.0).     11/26/2017 PET scan    PET 11/26/17  IMPRESSION: 1. Approximately 5 cm hypermetabolic mass in the pancreatic body and tail, maximum SUV 5.6, with morphologic findings suspicious for pancreatic adenocarcinoma. 2. However, there is also a hypermetabolic mass in the sigmoid colon with adjacent paracolic adenopathy. This mass has a maximum SUV of 7.4. A synchronous colon cancer is a distinct possibility. 3. Abnormal hypermetabolic nodules in the omentum, both in and adjacent to a small infraumbilical hernia, suspicious for metastatic disease. 4. Mildly hypermetabolic bilateral hilar lymph nodes. There is also some low-grade accentuated metabolic activity along a region of lingular atelectasis with some mild adjacent nodularity, as well as clustered nodularity posteriorly in the right upper lobe. I am not completely convinced that the thoracic findings represent malignancy; atypical infectious bronchiolitis with reactive lymph nodes might have a similar appearance, but this certainly deserves surveillance. Also consider a baseline CT of the chest with contrast. 5. Other imaging findings of potential clinical significance: Aortic Atherosclerosis (ICD10-I70.0). Coronary atherosclerosis with mild-to-moderate cardiomegaly. Prominent stool throughout the colon favors  constipation. Old left facial fractures.  12/01/2017 Imaging    CT AP W Contrast 12/01/17  IMPRESSION: 1. No change in irregular annular distal sigmoid colon wall thickening, cannot exclude primary sigmoid neoplasm. 2. No evidence of small-bowel obstruction. Moderate stool throughout the left colon above the site of sigmoid colon wall thickening, with new wall thickening and pericolonic fat stranding in the descending colon. Findings suggest a nonspecific descending colitis, potentially stercoral colitis due to fecal stasis. No free air or abscess. 3. Solid omental nodules are increased in size and number since 11/13/2017 CT, most compatible with progressive peritoneal carcinomatosis. 4. Poorly marginated hypoenhancing pancreatic body mass compatible with primary pancreatic adenocarcinoma, not appreciably changed in size. Worsening narrowing of the portosplenic venous confluence by the pancreatic mass. 5.  Aortic Atherosclerosis (ICD10-I70.0).     12/06/2017 Cancer Staging    Staging form: Exocrine Pancreas, AJCC 8th Edition - Clinical stage from 12/06/2017: Stage IV (cT3, cN0, pM1) - Signed by Truitt Merle, MD on 12/14/2017    12/06/2017 Initial Biopsy    Diagnosis Soft Tissue Needle Core Biopsy, peritoneal - METASTATIC ADENOCARCINOMA, CONSISTENT WITH CLINICAL HISTORY OF PRIMARY PANCREATIC MASS. Immunostains show that the tumor cells are CK7 and CDX2 positive; and CK20 negative, consistent with the interpretation of an upper GI and pancreatobiliary primary.    12/14/2017 Initial Diagnosis    Pancreatic cancer ([place])      FAMILY HISTORY:  We obtained a detailed, 4-generation family history.  Significant diagnoses are listed below: Family History  Problem Relation Age of Onset  . Heart disease Father 53       MI  . Cancer Sister 1       Cervical Cancer  . Cancer Mother 24       gastric cancer- pt states stomach or  liver cancer - unsure which   . Colon cancer Maternal  Aunt        dx 29s  . Cancer Maternal Grandmother        possible stomach cancer  . Diabetes Neg Hx   . Breast cancer Neg Hx   . Colon polyps Neg Hx   . Esophageal cancer Neg Hx   . Rectal cancer Neg Hx    Ms. Zbikowski has two children: a son, 30, and a daughter, 70. She has three grandchildren. Ms. Hammonds also had a sister who died of cervical cancer at 57.  Ms. Winther mother died at 37, she did have cancer but unsure what type; possibly gastric, possibly liver. She had 10 siblings. One of the patient's maternal aunts did have colon cancer diagnosed in her 16s, no cancers for her other aunts/uncles that she is aware of. No cancers in maternal cousins. Her maternal grandmother possibly had cancer, she believes some sort of abdominal cancer, died at 67. Her maternal grandfather died at 40.   Ms. Varnell father died of a heart attack at 87. He had 6 siblings. One of his brothers possibly had cancer, the patient is unsure. No cancers for the patient's paternal cousins. Her paternal grandfather died at 21, grandmother died at 61, no cancers.  Ms. Mapel is unaware of previous family history of genetic testing for hereditary cancer risks. Patient's maternal ancestors are of [place] descent, and paternal ancestors are of [place] descent. There is no reported Ashkenazi Jewish ancestry. There is no known consanguinity.  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 A portion of the result report is included below for reference.     While Ms. Bow has unfortunately passed away, we did discuss the genetic test results with her husband, Trilby Drummer as this information will be important  for their children to know. We also discussed that these results do not seem to explain the particular cancer history Ms. Fragoso had. Mr. Colebank was encouraged to provide Ms. 63 family members with this information and to keep in touch with genetics in case more testing becomes available in the future that could benefit her relatives.   RAD50 GENE The RAD50 gene is a component of the MRN complex, which is a protein complex consisting of the MRE11A, RAD50, and NBN genes. This complex plays a central role in double-strand break repair, DNA recombination, and maintenance of telomere integrity and meiosis (PMID: 68341962; UniProtKB - I29798 RAD50_HUMAN; Accessed July 2016. If there is a pathogenic variant in this gene that prevents it from functioning normally, the risk of developing certain types of cancers is increased.  CLINICAL CONDITION Women who are carriers of a single pathogenic RAD50 variant have an increased risk of breast cancer, although exact risk figures have yet to be determined (PMID: 92119417, 40814481, 85631497). Data also associate the RAD50 gene with ovarian cancer; however, these data are limited (PMID: 02637858). An elevated risk for other cancers has been considered, but available evidence is insufficient to make a determination at this time (PMID: 85027741, 28786767, 20947096). While an individual with a RAD50 pathogenic variant will not necessarily develop cancer in her lifetime, her cancer risk is increased when compared to the general population risk.  INHERITANCE Individuals with a single pathogenic variant in RAD50 have a 50% chance of passing that variant on to their offspring. It is now possible to identify relatives who can pursue testing for this specific familial variant. Many cases are inherited from a parent, but some cases occur spontaneously (i.e., an individual with a pathogenic variant has parents who do not have it).  Limited evidence also supports a correlation  between the RAD50 gene and autosomal recessive Nijmegen breakage syndrome-like disorder (NBSLD). NBSLD is characterized by a clinical phenotype resembling Nijmegen breakage syndrome including chromosomal instability, radiosensitivity, neurodevelopmental disease, and immunodeficiency, but with a milder clinical presentation (PMID: 28366294).  MANAGEMENT At this time, there are no published guidelines or recommendations suggesting RAD50-specific medical management. However, due to the associated increased risk of breast cancer in women with a pathogenic RAD50 variant, enhanced or more frequent cancer screening may be warranted. An individual's cancer risk and medical management are not determined by genetic test results alone. Overall cancer risk assessment incorporates additional factors, including personal medical history, family history, and any available genetic information that may result in a personalized plan for cancer prevention and surveillance.  Even though data regarding pathogenic RAD50 are limited and the associated risk of breast and ovarian cancer is currently emerging, knowing if a pathogenic variant is present is advantageous. At-risk relatives can be identified, enabling pursuit of a diagnostic evaluation. Further, the available information regarding hereditary cancer susceptibility genes is constantly evolving and more clinically relevant data regarding RAD50 are likely to become available in the near future. Awareness of this cancer predisposition encourages patients and their providers to inform at-risk family members, diligently follow standard screening protocols, and be vigilant in maintaining close and regular contact with their local genetics  clinic in anticipation of new information.  MUTYH (Carrier)   Genetic testing also revealed one pathogenic mutation in a gene called MUTYH (also known as MYH). We reviewed recessive inheritance and discussed when an individual has two MYH gene  mutations, this is associated with an increased risk for adenomatous (precancerous) colon polyps. Recently, the [employer]  (NCCN: V.1.2019) provided national guidelines to manage the colon cancer risk found with single (heterozygous) MUTYH mutation.  These guidelines include having a MUTYH pathogenic mutation and:  Personal history of colon cancer  Follow instructions provided by your physician based on your personal history.  Do not have a personal history of colon cancer but have a parent/sibling/child with colon cancer: Colonoscopy every 5 years starting at age 52 or 59 years younger than the earliest age of onset, whichever is younger.  Do not have a personal history of colon cancer and do not have a parent/sibling/child with colon cancer: Data is uncertain whether specialized screening is needed.  However, to our current knowledge, individuals with only one MYH gene mutation do not have an increased risk for colon polyps.   FAMILY MEMBERS:  It is important that all of Ms. 59 relatives (both men and women) know of the presence of these gene mutations. Site-specific genetic testing can sort out who in the family is at risk and who is not.    Ms. Harroun children have a 50% chance to have inherited this mutation. We recommend they have genetic testing for this same mutation, as identifying the presence of this mutation would allow them to also take advantage of risk-reducing measures.   PLAN:   Mr. Kunz plans to discuss these results with his children and wife's family and will reach out to [place] if we can be of any assistance in coordinating genetic testing for any of her relatives.   Our contact number was provided. Mr. Moro questions were answered to his satisfaction and he knows he is welcome to call us at anytime with additional questions or concerns.  Faith Rogue, MS Genetic Counselor [email or web address] Phone: [phone]

## 2018-01-25 NOTE — Telephone Encounter (Signed)
Spoke with Mr. Armas regarding his wife's genetic test results. Revealed that she was positive for a RAD50 pathogenic variant and a single MUTYH pathogenic variant. We discussed these genes and cancer risks, and that these results don't seem to explain his wife's cancer. He will give my information to his children who are local so that they can come in to be tested. I also offered my condolences.

## 2018-02-02 DEATH — deceased

## 2019-10-06 ENCOUNTER — Encounter: Payer: Self-pay | Admitting: Licensed Clinical Social Worker
